# Patient Record
Sex: Female | Born: 1945
Health system: Southern US, Community
[De-identification: ages and names within clinical notes are randomized; demographics above are authoritative.]

## PROBLEM LIST (undated history)

## (undated) HISTORY — PX: CHOLECYSTECTOMY: SHX55

---

## 2001-06-12 ENCOUNTER — Other Ambulatory Visit: Admission: RE | Admit: 2001-06-12 | Discharge: 2001-06-12 | Payer: Self-pay | Admitting: Family Medicine

## 2001-06-17 ENCOUNTER — Encounter: Payer: Self-pay | Admitting: Family Medicine

## 2001-06-17 ENCOUNTER — Ambulatory Visit (HOSPITAL_COMMUNITY): Admission: RE | Admit: 2001-06-17 | Discharge: 2001-06-17 | Payer: Self-pay | Admitting: Family Medicine

## 2002-02-12 ENCOUNTER — Encounter (HOSPITAL_COMMUNITY): Admission: RE | Admit: 2002-02-12 | Discharge: 2002-03-14 | Payer: Self-pay | Admitting: Rheumatology

## 2002-12-07 ENCOUNTER — Emergency Department (HOSPITAL_COMMUNITY): Admission: EM | Admit: 2002-12-07 | Discharge: 2002-12-07 | Payer: Self-pay | Admitting: Emergency Medicine

## 2002-12-07 ENCOUNTER — Encounter: Payer: Self-pay | Admitting: Emergency Medicine

## 2005-03-23 ENCOUNTER — Ambulatory Visit (HOSPITAL_COMMUNITY): Admission: RE | Admit: 2005-03-23 | Discharge: 2005-03-23 | Payer: Self-pay | Admitting: Family Medicine

## 2007-11-06 DIAGNOSIS — G35 Multiple sclerosis: Secondary | ICD-10-CM

## 2007-11-06 HISTORY — DX: Multiple sclerosis: G35

## 2011-07-19 ENCOUNTER — Encounter: Payer: Self-pay | Admitting: Emergency Medicine

## 2011-07-19 ENCOUNTER — Emergency Department (HOSPITAL_COMMUNITY)
Admission: EM | Admit: 2011-07-19 | Discharge: 2011-07-19 | Disposition: A | Discharge status: Home / Self Care | Payer: Medicaid Other | Attending: Emergency Medicine | Admitting: Emergency Medicine

## 2011-07-19 ENCOUNTER — Other Ambulatory Visit: Payer: Self-pay

## 2011-07-19 ENCOUNTER — Emergency Department (HOSPITAL_COMMUNITY): Payer: Medicaid Other

## 2011-07-19 DIAGNOSIS — I498 Other specified cardiac arrhythmias: Secondary | ICD-10-CM | POA: Insufficient documentation

## 2011-07-19 DIAGNOSIS — N390 Urinary tract infection, site not specified: Secondary | ICD-10-CM | POA: Insufficient documentation

## 2011-07-19 LAB — URINALYSIS, ROUTINE W REFLEX MICROSCOPIC
Bilirubin Urine: NEGATIVE
Ketones, ur: 40 mg/dL — AB
Nitrite: NEGATIVE
Specific Gravity, Urine: 1.025 (ref 1.005–1.030)
pH: 7 (ref 5.0–8.0)

## 2011-07-19 LAB — CARDIAC PANEL(CRET KIN+CKTOT+MB+TROPI)
Relative Index: INVALID (ref 0.0–2.5)
Troponin I: <0.3 ng/mL (ref <0.30)

## 2011-07-19 LAB — COMPREHENSIVE METABOLIC PANEL
ALT: 13 U/L (ref 0–35)
AST: 18 U/L (ref 0–37)
Alkaline Phosphatase: 149 U/L — ABNORMAL HIGH (ref 39–117)
CO2: 28 mEq/L (ref 19–32)
Calcium: 10.5 mg/dL (ref 8.4–10.5)
GFR calc Af Amer: >60 mL/min (ref >60)
Glucose, Bld: 126 mg/dL — ABNORMAL HIGH (ref 70–99)
Potassium: 3.3 mEq/L — ABNORMAL LOW (ref 3.5–5.1)
Sodium: 139 mEq/L (ref 135–145)
Total Protein: 8.7 g/dL — ABNORMAL HIGH (ref 6.0–8.3)

## 2011-07-19 LAB — DIFFERENTIAL
Basophils Absolute: 0 10*3/uL (ref 0.0–0.1)
Eosinophils Absolute: 0 10*3/uL (ref 0.0–0.7)
Lymphocytes Relative: 9 % — ABNORMAL LOW (ref 12–46)
Lymphs Abs: 1.2 10*3/uL (ref 0.7–4.0)
Neutrophils Relative %: 87 % — ABNORMAL HIGH (ref 43–77)

## 2011-07-19 LAB — OCCULT BLOOD, POC DEVICE: Fecal Occult Bld: NEGATIVE

## 2011-07-19 LAB — URINE MICROSCOPIC-ADD ON

## 2011-07-19 LAB — PROTIME-INR: INR: 1.07 (ref 0.00–1.49)

## 2011-07-19 LAB — CBC
Platelets: 296 10*3/uL (ref 150–400)
RBC: 4.77 MIL/uL (ref 3.87–5.11)
RDW: 15.2 % (ref 11.5–15.5)
WBC: 13.8 10*3/uL — ABNORMAL HIGH (ref 4.0–10.5)

## 2011-07-19 LAB — APTT: aPTT: 28 seconds (ref 24–37)

## 2011-07-19 MED ORDER — NITROFURANTOIN MACROCRYSTAL 100 MG PO CAPS: 100.0000 mg | ORAL_CAPSULE | Freq: Four times a day (QID) | ORAL | Status: AC

## 2011-07-19 MED ORDER — CEFTRIAXONE SODIUM 1 G IJ SOLR
1.0000 g | Freq: Once | INTRAMUSCULAR | Status: AC
  Administered 2011-07-19: 1 g via INTRAVENOUS
  Filled 2011-07-19: qty 1

## 2011-07-19 MED ORDER — IOHEXOL 300 MG/ML  SOLN
100.0000 mL | Freq: Once | INTRAMUSCULAR | Status: AC | PRN
  Administered 2011-07-19: 100 mL via INTRAVENOUS

## 2011-07-19 MED ORDER — SODIUM CHLORIDE 0.9 % IV BOLUS (SEPSIS)
500.0000 mL | Freq: Once | INTRAVENOUS | Status: AC
  Administered 2011-07-19: 13:00:00 via INTRAVENOUS

## 2011-07-19 MED ORDER — SODIUM CHLORIDE 0.9 % IV SOLN
INTRAVENOUS | Status: DC
  Administered 2011-07-19: 15:00:00 via INTRAVENOUS

## 2011-07-19 NOTE — ED Notes (Signed)
Pt has gallbladder out on aug 9 at [facility], continues to have pain that has gotten worse today, n/v today as well, pt has contractures of lower legs, uses wheelchair, abd tender with palpation,

## 2011-07-19 NOTE — ED Notes (Signed)
Family member also states that pt doesn't want to eat after the surgery, states that most of the pt's favorite foods are foods that she has been advised to avoid now, prior to surgery pt had good appetite.

## 2011-07-19 NOTE — ED Notes (Signed)
Pt was heme negative

## 2011-07-19 NOTE — ED Notes (Signed)
Pt sleeping, will arouse,

## 2011-07-19 NOTE — ED Notes (Signed)
Pt slow to drink contrast, comfort measures provided

## 2011-07-19 NOTE — ED Notes (Signed)
Pt c/o abd pain with n/v and no appetite since having her gallbladder removed aug. 9th.

## 2011-07-19 NOTE — ED Notes (Signed)
Pt sleeping, will arouse with verbal stimuli, pt attempted to drink contrast, pt will take sips at a time,

## 2011-07-19 NOTE — ED Notes (Signed)
Iv attempted X2 without success, another RN to attempt.  

## 2011-07-19 NOTE — ED Provider Notes (Signed)
History   Scribed for Hilario Quarry, MD, the patient was seen in room APA19/APA19. This chart was scribed by Clarita Crane. This patients care was started at 11:16AM  CSN: [id number] Arrival date & time: 07/19/2011 10:56 AM   Chief Complaint  Patient presents with   Nausea   Emesis   Weight Loss   The history is provided by a relative.   Tracy Armstrong is a 65 y.o. female who presents to the Emergency Department accompanied by daughter who states patient is complaining of severe weight loss with associated nausea, vomiting and decreased appetite onset several 1 month ago following cholecystectomy performed on Jun 14, 2011 at [facility]. Denies fever, diarrhea. Patient's daughter reports patient has lost approximately 50-60lbs in the last month following the cholecystectomy performed.  Notes patient's physician/surgeon recommended a cholecystectomy be performed after being with decreased appetite and having intermittent episodes of hematemesis during July 2012 States patient had a follow up with with surgeon on 07/05/2011 to have evaluation for persistent n/v following cholecystectomy with no further plan or treatment discussed. Patient with h/o stroke with left hemiparesis, hypertension.  HPI ELEMENTS: Onset: 1 month ago Duration: persistent since onset  Timing: constant   Severity: severe   Context:  as above  Associated symptoms: +n/v, decreased appetite. Denies fever, diarrhea   PAST MEDICAL HISTORY:  History reviewed. No pertinent past medical history.  PAST SURGICAL HISTORY:  Past Surgical History  Procedure Date   Cholecystectomy     MEDICATIONS:  Previous Medications   HYDROCODONE-ACETAMINOPHEN (NORCO) 5-325 MG PER TABLET    Take 1 tablet by mouth every 6 (six) hours as needed. For pain    HYDROCODONE-ACETAMINOPHEN (VICODIN) 5-500 MG PER TABLET    Take 1 tablet by mouth every 6 (six) hours as needed. For pain      ALLERGIES:  Allergies as of  07/19/2011   (No Known Allergies)     FAMILY HISTORY:  History reviewed. No pertinent family history.   SOCIAL HISTORY: History   Social History   Marital Status: Married    Spouse Name: N/A    Number of Children: N/A   Years of Education: N/A   Social History Main Topics   Smoking status: None   Smokeless tobacco: None   Alcohol Use: No   Drug Use: No   Sexually Active:    Other Topics Concern   None   Social History Narrative   None    OB History    Grav Para Term Preterm Abortions TAB SAB Ect Mult Living                  Review of Systems 10 Systems reviewed and are negative for acute change except as noted in the HPI.  Allergies  Review of patient's allergies indicates no known allergies.  Home Medications   Current Outpatient Rx  Name Route Sig Dispense Refill   HYDROCODONE-ACETAMINOPHEN 5-325 MG PO TABS Oral Take 1 tablet by mouth every 6 (six) hours as needed. For pain      HYDROCODONE-ACETAMINOPHEN 5-500 MG PO TABS Oral Take 1 tablet by mouth every 6 (six) hours as needed. For pain       Physical Exam    BP 116/49   Pulse 86   Temp(Src) 97.4 F (36.3 C) (Oral)   Resp 20   Ht 5\' 6"  (1.676 m)   Wt 132 lb (59.875 kg)   BMI 21.31 kg/m2   SpO2 100%  Physical  Exam  Nursing note and vitals reviewed. Constitutional: She appears well-developed and well-nourished. No distress.  HENT:  Head: Normocephalic and atraumatic.  Eyes: EOM are normal. Pupils are equal, round, and reactive to light.  Neck: Neck supple.  Cardiovascular: Normal rate, regular rhythm and intact distal pulses.   No murmur heard. Pulmonary/Chest: Effort normal. She has no wheezes.  Abdominal: Soft. She exhibits no distension. There is no tenderness.  Musculoskeletal: Normal range of motion. She exhibits no edema.  Neurological: She is alert. No sensory deficit.  Skin: Skin is warm and dry.  Psychiatric: She has a normal mood and affect. Her behavior is normal. She is  noncommunicative.    ED Course  Procedures  OTHER DATA REVIEWED: Nursing notes, vital signs, and past medical records reviewed. Lab results reviewed and considered Imaging results reviewed and considered  DIAGNOSTIC STUDIES: Oxygen Saturation is 98% on room air, normal by my interpretation.    LABS / RADIOLOGY: Results for orders placed during the hospital encounter of 07/19/11  CBC      Component Value Range   WBC 13.8 (*) 4.0 - 10.5 (K/uL)   RBC 4.77  3.87 - 5.11 (MIL/uL)   Hemoglobin 13.1  12.0 - 15.0 (g/dL)   HCT 16.1  09.6 - 04.5 (%)   MCV 84.5  78.0 - 100.0 (fL)   MCH 27.5  26.0 - 34.0 (pg)   MCHC 32.5  30.0 - 36.0 (g/dL)   RDW 40.9  81.1 - 91.4 (%)   Platelets 296  150 - 400 (K/uL)  DIFFERENTIAL      Component Value Range   Neutrophils Relative 87 (*) 43 - 77 (%)   Neutro Abs 12.0 (*) 1.7 - 7.7 (K/uL)   Lymphocytes Relative 9 (*) 12 - 46 (%)   Lymphs Abs 1.2  0.7 - 4.0 (K/uL)   Monocytes Relative 4  3 - 12 (%)   Monocytes Absolute 0.6  0.1 - 1.0 (K/uL)   Eosinophils Relative 0  0 - 5 (%)   Eosinophils Absolute 0.0  0.0 - 0.7 (K/uL)   Basophils Relative 0  0 - 1 (%)   Basophils Absolute 0.0  0.0 - 0.1 (K/uL)  COMPREHENSIVE METABOLIC PANEL      Component Value Range   Sodium 139  135 - 145 (mEq/L)   Potassium 3.3 (*) 3.5 - 5.1 (mEq/L)   Chloride 96  96 - 112 (mEq/L)   CO2 28  19 - 32 (mEq/L)   Glucose, Bld 126 (*) 70 - 99 (mg/dL)   BUN 17  6 - 23 (mg/dL)   Creatinine, Ser 7.82  0.50 - 1.10 (mg/dL)   Calcium 95.6  8.4 - 10.5 (mg/dL)   Total Protein 8.7 (*) 6.0 - 8.3 (g/dL)   Albumin 3.8  3.5 - 5.2 (g/dL)   AST 18  0 - 37 (U/L)   ALT 13  0 - 35 (U/L)   Alkaline Phosphatase 149 (*) 39 - 117 (U/L)   Total Bilirubin 0.5  0.3 - 1.2 (mg/dL)   GFR calc non Af Amer >60  >60 (mL/min)   GFR calc Af Amer >60  >60 (mL/min)  URINALYSIS, ROUTINE W REFLEX MICROSCOPIC      Component Value Range   Color, Urine YELLOW  YELLOW    Appearance TURBID (*) CLEAR    Specific  Gravity, Urine 1.025  1.005 - 1.030    pH 7.0  5.0 - 8.0    Glucose, UA NEGATIVE  NEGATIVE (mg/dL)   Hgb  urine dipstick MODERATE (*) NEGATIVE    Bilirubin Urine NEGATIVE  NEGATIVE    Ketones, ur 40 (*) NEGATIVE (mg/dL)   Protein, ur NEGATIVE  NEGATIVE (mg/dL)   Urobilinogen, UA 0.2  0.0 - 1.0 (mg/dL)   Nitrite NEGATIVE  NEGATIVE    Leukocytes, UA MODERATE (*) NEGATIVE   PROTIME-INR      Component Value Range   Prothrombin Time 14.1  11.6 - 15.2 (seconds)   INR 1.07  0.00 - 1.49   APTT      Component Value Range   aPTT 28  24 - 37 (seconds)  CARDIAC PANEL(CRET KIN+CKTOT+MB+TROPI)      Component Value Range   Total CK 25  7 - 177 (U/L)   CK, MB 3.2  0.3 - 4.0 (ng/mL)   Troponin I <0.30  <0.30 (ng/mL)   Relative Index RELATIVE INDEX IS INVALID  0.0 - 2.5   OCCULT BLOOD, POC DEVICE      Component Value Range   Fecal Occult Bld NEGATIVE    URINE MICROSCOPIC-ADD ON      Component Value Range   WBC, UA TOO NUMEROUS TO COUNT  <3 (WBC/hpf)   RBC / HPF 0-2  <3 (RBC/hpf)   Ct Abdomen Pelvis W Contrast  07/19/2011  *RADIOLOGY REPORT*  Clinical Data: History of cholecystectomy and August 2012, weight loss, vomiting  CT ABDOMEN AND PELVIS WITH CONTRAST  Technique:  Multidetector CT imaging of the abdomen and pelvis was performed following the standard protocol during bolus administration of intravenous contrast.  Contrast: OMNIPAQUE IOHEXOL 300 MG/ML IV SOLN  Comparison: None.  Findings: The lung bases are clear.  The liver enhances with no focal abnormality and no ductal dilatation is noted.  Surgical clips are present from prior cholecystectomy.  No abscess or fluid collection is seen within the gallbladder.  The pancreas appears somewhat atrophic and the pancreatic duct is not dilated.  The adrenal glands and spleen are unremarkable.  The stomach is not well distended.  The kidneys enhance with no calculus or mass and no hydronephrosis is seen.  The abdominal aorta is normal in caliber.   No adenopathy is seen.  The urinary bladder cannot be assessed, being decompressed by Foley catheter with some air within urinary bladder.  The uterus is normal in size and slightly inhomogeneous.  Small fibroids cannot be excluded.  No free fluid is seen within the pelvis.  There is a moderate amount of feces throughout the colon.  The only questionable abnormality of the colon is within the cecum where there is slight prominence of the mucosa.  Mild edema of the mucosa of the cecum cannot be excluded.  The terminal ileum and appendix are unremarkable.  IMPRESSION:  1.  Mild mucosal edema of the cecum cannot be excluded.  The terminal ileum and appendix are unremarkable. 2.  The urinary bladder is decompressed by Foley catheter.  Original Report Authenticated By: Juline Patch, M.D.    PROCEDURES:  ED COURSE / COORDINATION OF CARE: Orders Placed This Encounter  Procedures   Urine culture   CT Abdomen Pelvis W Contrast   CBC   Differential   Comprehensive metabolic panel   Urinalysis, Routine w reflex microscopic   Occult blood x 1 card to lab, stool   Protime-INR   APTT   Cardiac panel(cret kin+cktot+mb+tropi)   Urine microscopic-add on   Insert foley catheter   Occult blood, poc device   ED EKG    Date: 07/19/2011  Rate:  109  Rhythm: sinus tachycardia  QRS Axis: normal  Intervals: normal  ST/T Wave abnormalities: diffuse t wave inversion  Conduction Disutrbances:none  Narrative Interpretation:   Old EKG Reviewed: none available  MDM: Differential Diagnosis: Patient's lab significant for urinary tract infection. Patient with CT scan  PLAN: Discharge Home The patient is to return the emergency department if there is any worsening of symptoms. I have reviewed the discharge instructions with the patient/family  CONDITION ON DISCHARGE: Good.    MEDICATIONS GIVEN IN THE E.D.  Medications  0.9 %  sodium chloride infusion (0  Intravenous Duplicate 07/19/11 1435)    HYDROcodone-acetaminophen (NORCO) 5-325 MG per tablet (not administered)  HYDROcodone-acetaminophen (VICODIN) 5-500 MG per tablet (not administered)  sodium chloride 0.9 % bolus 500 mL ( mL Intravenous Given 07/19/11 1301)  cefTRIAXone (ROCEPHIN) 1 g in dextrose 5 % 50 mL IVPB (1 g Intravenous Given 07/19/11 1354)  iohexol (OMNIPAQUE) 300 MG/ML injection 100 mL (100 mL Intravenous Contrast Given 07/19/11 1509)     I personally performed the services described in this documentation, which was scribed in my presence. The recorded information has been reviewed and considered. Hilario Quarry, MD

## 2011-07-19 NOTE — ED Notes (Signed)
Pt daughter has left, message left for daughter to return call to er ,

## 2011-07-21 LAB — URINE CULTURE
Colony Count: >=100000
Culture  Setup Time: 201209140611

## 2011-07-22 NOTE — ED Notes (Signed)
+   urine culture. Chart sent to EDP office for review 

## 2011-07-26 NOTE — ED Notes (Signed)
Prescription called in to [facility] at [phone] for Cipro 500 mg po x3 days; no refills per josh geiple, pa-c

## 2012-12-30 DIAGNOSIS — I872 Venous insufficiency (chronic) (peripheral): Secondary | ICD-10-CM | POA: Diagnosis not present

## 2013-05-03 DIAGNOSIS — G35 Multiple sclerosis: Secondary | ICD-10-CM | POA: Diagnosis not present

## 2013-05-03 DIAGNOSIS — R32 Unspecified urinary incontinence: Secondary | ICD-10-CM | POA: Diagnosis not present

## 2013-05-03 DIAGNOSIS — G8929 Other chronic pain: Secondary | ICD-10-CM | POA: Diagnosis not present

## 2013-05-26 DIAGNOSIS — I10 Essential (primary) hypertension: Secondary | ICD-10-CM | POA: Diagnosis not present

## 2013-05-26 DIAGNOSIS — F0390 Unspecified dementia without behavioral disturbance: Secondary | ICD-10-CM | POA: Diagnosis not present

## 2013-05-26 DIAGNOSIS — Z0000 Encounter for general adult medical examination without abnormal findings: Secondary | ICD-10-CM | POA: Diagnosis not present

## 2013-07-02 DIAGNOSIS — G8929 Other chronic pain: Secondary | ICD-10-CM | POA: Diagnosis not present

## 2013-07-02 DIAGNOSIS — R32 Unspecified urinary incontinence: Secondary | ICD-10-CM | POA: Diagnosis not present

## 2013-07-02 DIAGNOSIS — G35 Multiple sclerosis: Secondary | ICD-10-CM | POA: Diagnosis not present

## 2013-08-31 DIAGNOSIS — G8929 Other chronic pain: Secondary | ICD-10-CM | POA: Diagnosis not present

## 2013-08-31 DIAGNOSIS — G35 Multiple sclerosis: Secondary | ICD-10-CM | POA: Diagnosis not present

## 2013-08-31 DIAGNOSIS — R32 Unspecified urinary incontinence: Secondary | ICD-10-CM | POA: Diagnosis not present

## 2013-09-10 DIAGNOSIS — E2839 Other primary ovarian failure: Secondary | ICD-10-CM | POA: Diagnosis not present

## 2013-09-10 DIAGNOSIS — M810 Age-related osteoporosis without current pathological fracture: Secondary | ICD-10-CM | POA: Diagnosis not present

## 2013-09-10 DIAGNOSIS — G35 Multiple sclerosis: Secondary | ICD-10-CM | POA: Diagnosis not present

## 2013-09-10 DIAGNOSIS — E289 Ovarian dysfunction, unspecified: Secondary | ICD-10-CM | POA: Diagnosis not present

## 2013-10-09 DIAGNOSIS — I10 Essential (primary) hypertension: Secondary | ICD-10-CM | POA: Diagnosis not present

## 2013-10-30 DIAGNOSIS — G8929 Other chronic pain: Secondary | ICD-10-CM | POA: Diagnosis not present

## 2013-10-30 DIAGNOSIS — R32 Unspecified urinary incontinence: Secondary | ICD-10-CM | POA: Diagnosis not present

## 2013-10-30 DIAGNOSIS — G35 Multiple sclerosis: Secondary | ICD-10-CM | POA: Diagnosis not present

## 2013-12-14 DIAGNOSIS — I10 Essential (primary) hypertension: Secondary | ICD-10-CM | POA: Diagnosis not present

## 2013-12-29 DIAGNOSIS — G35 Multiple sclerosis: Secondary | ICD-10-CM | POA: Diagnosis not present

## 2013-12-29 DIAGNOSIS — R32 Unspecified urinary incontinence: Secondary | ICD-10-CM | POA: Diagnosis not present

## 2013-12-29 DIAGNOSIS — G8929 Other chronic pain: Secondary | ICD-10-CM | POA: Diagnosis not present

## 2013-12-29 DIAGNOSIS — M2459 Contracture, other specified joint: Secondary | ICD-10-CM | POA: Diagnosis not present

## 2014-01-12 DIAGNOSIS — I10 Essential (primary) hypertension: Secondary | ICD-10-CM | POA: Diagnosis not present

## 2014-01-12 DIAGNOSIS — Z131 Encounter for screening for diabetes mellitus: Secondary | ICD-10-CM | POA: Diagnosis not present

## 2014-02-04 DIAGNOSIS — G35 Multiple sclerosis: Secondary | ICD-10-CM | POA: Diagnosis not present

## 2014-02-22 ENCOUNTER — Ambulatory Visit (HOSPITAL_COMMUNITY)
Admission: RE | Admit: 2014-02-22 | Discharge: 2014-02-22 | Disposition: A | Discharge status: Home / Self Care | Payer: Medicare Other | Source: Ambulatory Visit | Attending: Internal Medicine | Admitting: Internal Medicine

## 2014-02-22 DIAGNOSIS — G35 Multiple sclerosis: Secondary | ICD-10-CM | POA: Diagnosis not present

## 2014-02-22 DIAGNOSIS — IMO0001 Reserved for inherently not codable concepts without codable children: Secondary | ICD-10-CM | POA: Diagnosis not present

## 2014-02-22 DIAGNOSIS — M25619 Stiffness of unspecified shoulder, not elsewhere classified: Secondary | ICD-10-CM | POA: Diagnosis not present

## 2014-02-22 DIAGNOSIS — M24569 Contracture, unspecified knee: Secondary | ICD-10-CM | POA: Insufficient documentation

## 2014-02-22 NOTE — Evaluation (Addendum)
Occupational Therapy Wheelchair Evaluation  Patient Details  Name: Tracy Armstrong MRN: [id number] Date of Birth: July 22, 1946  Today's Date: 02/22/2014 Time: [id number] OT Time Calculation (min): 29 min OT w/c eval [id number] [name]29'   Past Medical History: No past medical history on file. Past Surgical History:  Past Surgical History  Procedure Laterality Date  . Cholecystectomy      Date 02/22/2014 Patient Name Tracy Armstrong [address] [place], [place] [id number] DOB July 22, 1946   To Whom It May Concern,    Tracy Armstrong was seen today in this clinic for a power wheelchair evaluation due to limited mobility sustained from limited progressive MS diagnosed approx. 3-4 years ago.  Tracy Armstrong is a 68 y/o female with a past medical history that includes multiple sclerosis, arthritis, poor circulation, decubitus ulcers, and cellulitis.  Tracy Armstrong lives with 927 y/o daughter, [name] who provides care 24/7. Patient is able to feed herself independently and requires Total A-Max A for BADL such as bathing and dressing. Patient spends the majority of her time in her manual wheelchair, hospital bed or recliner. Patient relies on others for all mobility. Patient has not walked for 12-13 years.  A FULL PHYSICAL ASSESSMENT REVEALS THE FOLLOWING    Existing Equipment:   Tracy Armstrong has a manual wheelchair, hospital bed, and hoyer lift. Patient has had a power wheelchair in the past and has since been nonfunctioning.   Transfers:  Patient's daughter, [name] utilizes hoyer lift for transfers or physically picks patient up to transfer to various surfaces.      Head and Neck:   ROM slightly limited: extension, Right and Left rotation  Trunk:     Zero trunk control. Unable to sit without assistance.   Pelvis:    no active or passive movement.     Hip: No active movement. Patient's resting seated posture is with hip in slight extension due to contracture.  Knees:  No active or passive movement. Bilateral knees in  flexion contracture.   Feet and Ankles: Right ankle: active dorsiflexion and plantar flexion. No active or passive movement in Left ankle.   Upper Extremities: AROM bilateral shoulder flexion to approx. 75 degrees. Shoulder abduction to approx. 65 degrees. Bilateral elbow and wrist AROM is WFL.   Lower Extremities:  Please see above. No active or passive movement in hips. Left leg adducted due to contracture.   Weight Shifting Ability:  Patient is unable to weight shift without assistance.   Skin Integrity:  Patient has had one decubitus ulcer on left buttocks.  Cognition:  Patient is oriented x3.  Activity Tolerance: Patient is able to tolerate siting up for 2 hours at a time during the day.  GOALS/OBJECTIVE OF SEATING INTERVENTION:  Recommendations: A power wheelchair is recommended for Tracy Armstrong due to her limited progressive MS. A power wheelchair would allow Ms. Lower greater independence and safety when completing daily activities in her home and in the community. Tracy Armstrong's quality of life would greatly improve with the use of a power wheelchair.   Medical necessity of recommended durable medical equipment: Based upon this evaluation, it has been determined that the patient has functional deficits in the areas of mobility and self care, which are a result of the above listed diagnosis.   Specific functional limitations include: The patient is unable to walk and also unable to functionally propel a lightweight or standard manual wheelchair for independent mobility within the home due to upper and lower extremity weakness, trunk instability and  poor endurance. Patient will use the power wheelchair on a full time basis as her primary means of mobility. Patient s mobility, positioning, and durability needs make the use of a standard power wheelchair inappropriate. The recommended wheelchair meets current and future positioning needs, accessibility, durability, and safety requirements for  functional use within patient s home environment. It is the least expensive power wheelchair that meets the patients current and future needs.  Power Chair: (Q-6 Edge)  Patient has physical and cognitive capabilities to use the recommended power mobility device. Patient s home provides adequate access between rooms, maneuvering space, and surfaces for the operation of a power chair.  The Power base is required in order to provide the patient with the medically necessary electronics and multiple power operations (i.e. tilt,and  recline, ) through the recommended hand control.  This chair is more maneuverable than comparably priced traditional power wheelchairs due to the drive wheels being directly at the center of gravity of the user. This maneuverability is required to enable the patient to negotiate the tight turns, small spaces, and obstacles characteristic of the home environment.   Electronics: Copy    Multiple actuator through the drive control. These electronics are sufficiently programmable to allow patient to operate the power chair safely within the home environment. The patient requires these controls to operate the tilt, recline,  and power elevating leg rest through the joystick thus eliminating additional switches and the need to find additional access points, as she is proficient with controlling the power chair through the joystick. These controls will also allow for any further modifications/addition to the drive controls as patients function deteriorate in the future  Tilt System : (Power Tilt in Space) Patient must rest in the recumbent position several times per day and transfers between the wheelchair and bed are very difficult. The tilt mechanism decreases the need for extensive assistance required for multiple transfers and is medically necessary to position the patient properly for care required.  Patient is  unable to tolerate sitting in the upright position secondary to postural weakness and fatigue. The patient is also unable to independently perform forward or side lean weight shifts for pressure management and remains in a posterior pelvic tilt for extended periods. The recommended tilt system provides patient with a method of performing an adequate weight shift independently, thereby, reducing the risk of skin breakdown. With the power tilt, the patient will be able to independently adjust their position when sitting in the wheelchair to allow for adequate pressure relief and support posture.  Power Recline:  A combination system of Tilt and Recline are needed in order to achieve maximum weight re-distribution and comfort as sensation is intact   Non Power Elevating Leg rest: (Center mount power elevating leg rests) The use of elevating leg rests enables the patient to independently elevate her legs when sitting in the wheelchair, effectively reducing or accommodating spasticity and extensor tone of lower extremities. The leg rests must be articulating to effectively achieve elevation without resulting in flexion of the knees and postural compromise.  Foot Rests:  Patient requires (angle adjustable footplates) to accommodate her foot position, and to provide adequate foot support and pressure distribution.  [employer] Acta Back Deep: The recommended (Curved/Reclining/built in lateral support Back) simulates the contours of the patient s trunk and provides additional lumbar support to help maintain proper spinal curvatures and pelvic alignment. Adjustability of the contours is necessary to meet the patients  positioning, safety and comfort needs.        Seat Cushion: The recommended (Solution 1) (18 x 20) cushion allows for overall protection and equalization for positioning by accommodating to individual contours. The (Solution 1) cushion is medically necessary to offer the patient proper  positioning,  and a stable sitting surface while seated in the wheelchair.  The contours of the base enhance posture by accommodation of and additional protection to bony prominences. The  cushion also allows for a breathable surface which transfers heat and moisture rather than trapping it.    Armrest:  Height adjustable Full length armrests are necessary to support the shoulder joint and surrounding structures. Lack of arm support can result in chronic stretch and damage of the joint capsule, ligaments, nerves, and blood vessels. The patient requires the use of height adjustable armrests to achieve proper armrests height for her shoulders to be adequately supported.  The following durable medical equipment is recommended to treat the patient s permanent and progressive disability.  Q6-Edge  power wheelchair (18 x 20) Head Rest True comfort Headrest (Large Headrest w/mounting hardware/removable) Transport Tie down Brackets  Q logics Joystick (Swing away Right mount) 100 Amp Q-Logic controller and Harness for expandable controller (multiple power options) [employer] Acta Back Deep  positioning Backback  Soulution 1 skin protection & positioning Seat cushion Power Tilt Power Recline  Non-Power Elevating Leg Rests (center mount) Calf Panel Angle adjustable Foot Plates Full Length height/angle adjustable swing away arm rests(Cantilever) Waterfall Arm pads  If you require any further information concerning Ms. Lalor's positioning, independence or mobility needs; or any further information why a lesser device will not work, please do not hesitate to contact me at [facility], [address] [place] [address] [phone].    Thank you for this referral,  ____________________        Limmie Patricia OTR/L, CBIS        GO Functional Assessment Tool Used: clinical observation/judgement Functional Limitation: Mobility: Walking and moving around Mobility: Walking and Moving  Around Current Status [phone]): 100 percent impaired, limited or restricted Mobility: Walking and Moving Around Goal Status [phone]): 100 percent impaired, limited or restricted Mobility: Walking and Moving Around Discharge Status [phone]): 100 percent impaired, limited or restricted  Limmie Patricia, OTR/L,CBIS   02/22/2014, 4:07 PM  Physician Documentation Your signature is required to indicate approval of the treatment plan as stated above.  Please sign and either send electronically or make a copy of this report for your files and return this physician signed original.  Please mark one 1.__approve of plan  2. ___approve of plan with the following conditions.   ______________________________                                                          _____________________ Physician Signature  Date  

## 2014-02-27 DIAGNOSIS — G35 Multiple sclerosis: Secondary | ICD-10-CM | POA: Diagnosis not present

## 2014-02-27 DIAGNOSIS — R32 Unspecified urinary incontinence: Secondary | ICD-10-CM | POA: Diagnosis not present

## 2014-02-27 DIAGNOSIS — G8929 Other chronic pain: Secondary | ICD-10-CM | POA: Diagnosis not present

## 2014-02-27 DIAGNOSIS — M2459 Contracture, other specified joint: Secondary | ICD-10-CM | POA: Diagnosis not present

## 2014-03-25 DIAGNOSIS — G35 Multiple sclerosis: Secondary | ICD-10-CM | POA: Diagnosis not present

## 2014-04-28 DIAGNOSIS — G8929 Other chronic pain: Secondary | ICD-10-CM | POA: Diagnosis not present

## 2014-04-28 DIAGNOSIS — R32 Unspecified urinary incontinence: Secondary | ICD-10-CM | POA: Diagnosis not present

## 2014-04-28 DIAGNOSIS — G35 Multiple sclerosis: Secondary | ICD-10-CM | POA: Diagnosis not present

## 2014-04-28 DIAGNOSIS — M2459 Contracture, other specified joint: Secondary | ICD-10-CM | POA: Diagnosis not present

## 2014-05-11 NOTE — Addendum Note (Signed)
Encounter addended by: Jeanene Erb, OT on: 05/11/2014  3:53 PM<BR>     Documentation filed: Episodes

## 2014-05-14 DIAGNOSIS — L8993 Pressure ulcer of unspecified site, stage 3: Secondary | ICD-10-CM | POA: Diagnosis not present

## 2014-05-19 DIAGNOSIS — L8992 Pressure ulcer of unspecified site, stage 2: Secondary | ICD-10-CM | POA: Diagnosis not present

## 2014-05-19 DIAGNOSIS — L89899 Pressure ulcer of other site, unspecified stage: Secondary | ICD-10-CM | POA: Diagnosis not present

## 2014-05-19 DIAGNOSIS — R32 Unspecified urinary incontinence: Secondary | ICD-10-CM | POA: Diagnosis not present

## 2014-05-19 DIAGNOSIS — G8929 Other chronic pain: Secondary | ICD-10-CM | POA: Diagnosis not present

## 2014-05-19 DIAGNOSIS — L8993 Pressure ulcer of unspecified site, stage 3: Secondary | ICD-10-CM | POA: Diagnosis not present

## 2014-05-19 DIAGNOSIS — I10 Essential (primary) hypertension: Secondary | ICD-10-CM | POA: Diagnosis not present

## 2014-05-19 DIAGNOSIS — M2459 Contracture, other specified joint: Secondary | ICD-10-CM | POA: Diagnosis not present

## 2014-05-19 DIAGNOSIS — G35 Multiple sclerosis: Secondary | ICD-10-CM | POA: Diagnosis not present

## 2014-05-26 DIAGNOSIS — M2459 Contracture, other specified joint: Secondary | ICD-10-CM | POA: Diagnosis not present

## 2014-05-26 DIAGNOSIS — L8992 Pressure ulcer of unspecified site, stage 2: Secondary | ICD-10-CM | POA: Diagnosis not present

## 2014-05-26 DIAGNOSIS — L89899 Pressure ulcer of other site, unspecified stage: Secondary | ICD-10-CM | POA: Diagnosis not present

## 2014-05-26 DIAGNOSIS — G35 Multiple sclerosis: Secondary | ICD-10-CM | POA: Diagnosis not present

## 2014-05-26 DIAGNOSIS — L8993 Pressure ulcer of unspecified site, stage 3: Secondary | ICD-10-CM | POA: Diagnosis not present

## 2014-05-26 DIAGNOSIS — I10 Essential (primary) hypertension: Secondary | ICD-10-CM | POA: Diagnosis not present

## 2014-06-03 DIAGNOSIS — L8992 Pressure ulcer of unspecified site, stage 2: Secondary | ICD-10-CM | POA: Diagnosis not present

## 2014-06-03 DIAGNOSIS — L8993 Pressure ulcer of unspecified site, stage 3: Secondary | ICD-10-CM | POA: Diagnosis not present

## 2014-06-03 DIAGNOSIS — I10 Essential (primary) hypertension: Secondary | ICD-10-CM | POA: Diagnosis not present

## 2014-06-03 DIAGNOSIS — G35 Multiple sclerosis: Secondary | ICD-10-CM | POA: Diagnosis not present

## 2014-06-03 DIAGNOSIS — L89899 Pressure ulcer of other site, unspecified stage: Secondary | ICD-10-CM | POA: Diagnosis not present

## 2014-06-03 DIAGNOSIS — M2459 Contracture, other specified joint: Secondary | ICD-10-CM | POA: Diagnosis not present

## 2014-06-07 DIAGNOSIS — I10 Essential (primary) hypertension: Secondary | ICD-10-CM | POA: Diagnosis not present

## 2014-06-07 DIAGNOSIS — L8992 Pressure ulcer of unspecified site, stage 2: Secondary | ICD-10-CM | POA: Diagnosis not present

## 2014-06-07 DIAGNOSIS — M2459 Contracture, other specified joint: Secondary | ICD-10-CM | POA: Diagnosis not present

## 2014-06-07 DIAGNOSIS — L89899 Pressure ulcer of other site, unspecified stage: Secondary | ICD-10-CM | POA: Diagnosis not present

## 2014-06-07 DIAGNOSIS — G35 Multiple sclerosis: Secondary | ICD-10-CM | POA: Diagnosis not present

## 2014-06-07 DIAGNOSIS — L8993 Pressure ulcer of unspecified site, stage 3: Secondary | ICD-10-CM | POA: Diagnosis not present

## 2014-06-10 DIAGNOSIS — M2459 Contracture, other specified joint: Secondary | ICD-10-CM | POA: Diagnosis not present

## 2014-06-10 DIAGNOSIS — I10 Essential (primary) hypertension: Secondary | ICD-10-CM | POA: Diagnosis not present

## 2014-06-10 DIAGNOSIS — G35 Multiple sclerosis: Secondary | ICD-10-CM | POA: Diagnosis not present

## 2014-06-10 DIAGNOSIS — L89899 Pressure ulcer of other site, unspecified stage: Secondary | ICD-10-CM | POA: Diagnosis not present

## 2014-06-10 DIAGNOSIS — L8993 Pressure ulcer of unspecified site, stage 3: Secondary | ICD-10-CM | POA: Diagnosis not present

## 2014-06-10 DIAGNOSIS — L8992 Pressure ulcer of unspecified site, stage 2: Secondary | ICD-10-CM | POA: Diagnosis not present

## 2014-06-14 DIAGNOSIS — L8993 Pressure ulcer of unspecified site, stage 3: Secondary | ICD-10-CM | POA: Diagnosis not present

## 2014-06-14 DIAGNOSIS — M2459 Contracture, other specified joint: Secondary | ICD-10-CM | POA: Diagnosis not present

## 2014-06-14 DIAGNOSIS — L8992 Pressure ulcer of unspecified site, stage 2: Secondary | ICD-10-CM | POA: Diagnosis not present

## 2014-06-14 DIAGNOSIS — L89899 Pressure ulcer of other site, unspecified stage: Secondary | ICD-10-CM | POA: Diagnosis not present

## 2014-06-14 DIAGNOSIS — I10 Essential (primary) hypertension: Secondary | ICD-10-CM | POA: Diagnosis not present

## 2014-06-14 DIAGNOSIS — G35 Multiple sclerosis: Secondary | ICD-10-CM | POA: Diagnosis not present

## 2014-06-28 DIAGNOSIS — M2459 Contracture, other specified joint: Secondary | ICD-10-CM | POA: Diagnosis not present

## 2014-06-28 DIAGNOSIS — L89899 Pressure ulcer of other site, unspecified stage: Secondary | ICD-10-CM | POA: Diagnosis not present

## 2014-06-28 DIAGNOSIS — G35 Multiple sclerosis: Secondary | ICD-10-CM | POA: Diagnosis not present

## 2014-06-28 DIAGNOSIS — I10 Essential (primary) hypertension: Secondary | ICD-10-CM | POA: Diagnosis not present

## 2014-06-28 DIAGNOSIS — L8992 Pressure ulcer of unspecified site, stage 2: Secondary | ICD-10-CM | POA: Diagnosis not present

## 2014-06-28 DIAGNOSIS — L8993 Pressure ulcer of unspecified site, stage 3: Secondary | ICD-10-CM | POA: Diagnosis not present

## 2014-07-05 DIAGNOSIS — L89899 Pressure ulcer of other site, unspecified stage: Secondary | ICD-10-CM | POA: Diagnosis not present

## 2014-07-05 DIAGNOSIS — L8993 Pressure ulcer of unspecified site, stage 3: Secondary | ICD-10-CM | POA: Diagnosis not present

## 2014-07-05 DIAGNOSIS — G35 Multiple sclerosis: Secondary | ICD-10-CM | POA: Diagnosis not present

## 2014-07-05 DIAGNOSIS — I10 Essential (primary) hypertension: Secondary | ICD-10-CM | POA: Diagnosis not present

## 2014-07-05 DIAGNOSIS — L8992 Pressure ulcer of unspecified site, stage 2: Secondary | ICD-10-CM | POA: Diagnosis not present

## 2014-07-05 DIAGNOSIS — M2459 Contracture, other specified joint: Secondary | ICD-10-CM | POA: Diagnosis not present

## 2014-07-15 DIAGNOSIS — G35 Multiple sclerosis: Secondary | ICD-10-CM | POA: Diagnosis not present

## 2014-07-15 DIAGNOSIS — L8993 Pressure ulcer of unspecified site, stage 3: Secondary | ICD-10-CM | POA: Diagnosis not present

## 2014-07-15 DIAGNOSIS — I10 Essential (primary) hypertension: Secondary | ICD-10-CM | POA: Diagnosis not present

## 2014-07-15 DIAGNOSIS — L8992 Pressure ulcer of unspecified site, stage 2: Secondary | ICD-10-CM | POA: Diagnosis not present

## 2014-07-15 DIAGNOSIS — M2459 Contracture, other specified joint: Secondary | ICD-10-CM | POA: Diagnosis not present

## 2014-07-15 DIAGNOSIS — L89899 Pressure ulcer of other site, unspecified stage: Secondary | ICD-10-CM | POA: Diagnosis not present

## 2014-07-18 DIAGNOSIS — L89892 Pressure ulcer of other site, stage 2: Secondary | ICD-10-CM | POA: Diagnosis not present

## 2014-07-18 DIAGNOSIS — I10 Essential (primary) hypertension: Secondary | ICD-10-CM | POA: Diagnosis not present

## 2014-07-18 DIAGNOSIS — L89893 Pressure ulcer of other site, stage 3: Secondary | ICD-10-CM | POA: Diagnosis not present

## 2014-07-18 DIAGNOSIS — L8992 Pressure ulcer of unspecified site, stage 2: Secondary | ICD-10-CM | POA: Diagnosis not present

## 2014-07-18 DIAGNOSIS — G35 Multiple sclerosis: Secondary | ICD-10-CM | POA: Diagnosis not present

## 2014-07-18 DIAGNOSIS — M2450 Contracture, unspecified joint: Secondary | ICD-10-CM | POA: Diagnosis not present

## 2014-07-18 DIAGNOSIS — G8929 Other chronic pain: Secondary | ICD-10-CM | POA: Diagnosis not present

## 2014-07-18 DIAGNOSIS — L8993 Pressure ulcer of unspecified site, stage 3: Secondary | ICD-10-CM | POA: Diagnosis not present

## 2014-07-18 DIAGNOSIS — R32 Unspecified urinary incontinence: Secondary | ICD-10-CM | POA: Diagnosis not present

## 2014-07-18 DIAGNOSIS — L89899 Pressure ulcer of other site, unspecified stage: Secondary | ICD-10-CM | POA: Diagnosis not present

## 2014-07-20 DIAGNOSIS — L89892 Pressure ulcer of other site, stage 2: Secondary | ICD-10-CM | POA: Diagnosis not present

## 2014-07-20 DIAGNOSIS — L89893 Pressure ulcer of other site, stage 3: Secondary | ICD-10-CM | POA: Diagnosis not present

## 2014-07-20 DIAGNOSIS — I10 Essential (primary) hypertension: Secondary | ICD-10-CM | POA: Diagnosis not present

## 2014-07-20 DIAGNOSIS — G35 Multiple sclerosis: Secondary | ICD-10-CM | POA: Diagnosis not present

## 2014-07-20 DIAGNOSIS — M2450 Contracture, unspecified joint: Secondary | ICD-10-CM | POA: Diagnosis not present

## 2014-07-20 DIAGNOSIS — G8929 Other chronic pain: Secondary | ICD-10-CM | POA: Diagnosis not present

## 2014-07-21 DIAGNOSIS — L89893 Pressure ulcer of other site, stage 3: Secondary | ICD-10-CM | POA: Diagnosis not present

## 2014-07-21 DIAGNOSIS — G35 Multiple sclerosis: Secondary | ICD-10-CM | POA: Diagnosis not present

## 2014-07-21 DIAGNOSIS — M2450 Contracture, unspecified joint: Secondary | ICD-10-CM | POA: Diagnosis not present

## 2014-07-21 DIAGNOSIS — L89892 Pressure ulcer of other site, stage 2: Secondary | ICD-10-CM | POA: Diagnosis not present

## 2014-07-21 DIAGNOSIS — I10 Essential (primary) hypertension: Secondary | ICD-10-CM | POA: Diagnosis not present

## 2014-07-21 DIAGNOSIS — G8929 Other chronic pain: Secondary | ICD-10-CM | POA: Diagnosis not present

## 2014-07-27 DIAGNOSIS — I10 Essential (primary) hypertension: Secondary | ICD-10-CM | POA: Diagnosis not present

## 2014-07-27 DIAGNOSIS — G35 Multiple sclerosis: Secondary | ICD-10-CM | POA: Diagnosis not present

## 2014-07-27 DIAGNOSIS — G8929 Other chronic pain: Secondary | ICD-10-CM | POA: Diagnosis not present

## 2014-07-27 DIAGNOSIS — M2450 Contracture, unspecified joint: Secondary | ICD-10-CM | POA: Diagnosis not present

## 2014-07-27 DIAGNOSIS — L89893 Pressure ulcer of other site, stage 3: Secondary | ICD-10-CM | POA: Diagnosis not present

## 2014-07-27 DIAGNOSIS — L89892 Pressure ulcer of other site, stage 2: Secondary | ICD-10-CM | POA: Diagnosis not present

## 2014-08-02 DIAGNOSIS — L89893 Pressure ulcer of other site, stage 3: Secondary | ICD-10-CM | POA: Diagnosis not present

## 2014-08-02 DIAGNOSIS — L89892 Pressure ulcer of other site, stage 2: Secondary | ICD-10-CM | POA: Diagnosis not present

## 2014-08-02 DIAGNOSIS — G8929 Other chronic pain: Secondary | ICD-10-CM | POA: Diagnosis not present

## 2014-08-02 DIAGNOSIS — M2450 Contracture, unspecified joint: Secondary | ICD-10-CM | POA: Diagnosis not present

## 2014-08-02 DIAGNOSIS — G35 Multiple sclerosis: Secondary | ICD-10-CM | POA: Diagnosis not present

## 2014-08-02 DIAGNOSIS — I10 Essential (primary) hypertension: Secondary | ICD-10-CM | POA: Diagnosis not present

## 2014-08-10 DIAGNOSIS — M2450 Contracture, unspecified joint: Secondary | ICD-10-CM | POA: Diagnosis not present

## 2014-08-10 DIAGNOSIS — I10 Essential (primary) hypertension: Secondary | ICD-10-CM | POA: Diagnosis not present

## 2014-08-10 DIAGNOSIS — L89892 Pressure ulcer of other site, stage 2: Secondary | ICD-10-CM | POA: Diagnosis not present

## 2014-08-10 DIAGNOSIS — G35 Multiple sclerosis: Secondary | ICD-10-CM | POA: Diagnosis not present

## 2014-08-10 DIAGNOSIS — G8929 Other chronic pain: Secondary | ICD-10-CM | POA: Diagnosis not present

## 2014-08-10 DIAGNOSIS — L89893 Pressure ulcer of other site, stage 3: Secondary | ICD-10-CM | POA: Diagnosis not present

## 2014-08-16 DIAGNOSIS — I10 Essential (primary) hypertension: Secondary | ICD-10-CM | POA: Diagnosis not present

## 2014-08-17 DIAGNOSIS — G8929 Other chronic pain: Secondary | ICD-10-CM | POA: Diagnosis not present

## 2014-08-17 DIAGNOSIS — L89893 Pressure ulcer of other site, stage 3: Secondary | ICD-10-CM | POA: Diagnosis not present

## 2014-08-17 DIAGNOSIS — M2450 Contracture, unspecified joint: Secondary | ICD-10-CM | POA: Diagnosis not present

## 2014-08-17 DIAGNOSIS — L89892 Pressure ulcer of other site, stage 2: Secondary | ICD-10-CM | POA: Diagnosis not present

## 2014-08-17 DIAGNOSIS — I10 Essential (primary) hypertension: Secondary | ICD-10-CM | POA: Diagnosis not present

## 2014-08-17 DIAGNOSIS — G35 Multiple sclerosis: Secondary | ICD-10-CM | POA: Diagnosis not present

## 2014-08-24 DIAGNOSIS — I10 Essential (primary) hypertension: Secondary | ICD-10-CM | POA: Diagnosis not present

## 2014-08-24 DIAGNOSIS — M2450 Contracture, unspecified joint: Secondary | ICD-10-CM | POA: Diagnosis not present

## 2014-08-24 DIAGNOSIS — L89892 Pressure ulcer of other site, stage 2: Secondary | ICD-10-CM | POA: Diagnosis not present

## 2014-08-24 DIAGNOSIS — G35 Multiple sclerosis: Secondary | ICD-10-CM | POA: Diagnosis not present

## 2014-08-24 DIAGNOSIS — L89893 Pressure ulcer of other site, stage 3: Secondary | ICD-10-CM | POA: Diagnosis not present

## 2014-08-24 DIAGNOSIS — G8929 Other chronic pain: Secondary | ICD-10-CM | POA: Diagnosis not present

## 2014-08-31 DIAGNOSIS — L89892 Pressure ulcer of other site, stage 2: Secondary | ICD-10-CM | POA: Diagnosis not present

## 2014-08-31 DIAGNOSIS — G8929 Other chronic pain: Secondary | ICD-10-CM | POA: Diagnosis not present

## 2014-08-31 DIAGNOSIS — L89893 Pressure ulcer of other site, stage 3: Secondary | ICD-10-CM | POA: Diagnosis not present

## 2014-08-31 DIAGNOSIS — G35 Multiple sclerosis: Secondary | ICD-10-CM | POA: Diagnosis not present

## 2014-08-31 DIAGNOSIS — I10 Essential (primary) hypertension: Secondary | ICD-10-CM | POA: Diagnosis not present

## 2014-08-31 DIAGNOSIS — M2450 Contracture, unspecified joint: Secondary | ICD-10-CM | POA: Diagnosis not present

## 2014-09-06 DIAGNOSIS — L89893 Pressure ulcer of other site, stage 3: Secondary | ICD-10-CM | POA: Diagnosis not present

## 2014-09-06 DIAGNOSIS — L89892 Pressure ulcer of other site, stage 2: Secondary | ICD-10-CM | POA: Diagnosis not present

## 2014-09-06 DIAGNOSIS — G8929 Other chronic pain: Secondary | ICD-10-CM | POA: Diagnosis not present

## 2014-09-06 DIAGNOSIS — M2450 Contracture, unspecified joint: Secondary | ICD-10-CM | POA: Diagnosis not present

## 2014-09-06 DIAGNOSIS — G35 Multiple sclerosis: Secondary | ICD-10-CM | POA: Diagnosis not present

## 2014-09-06 DIAGNOSIS — I10 Essential (primary) hypertension: Secondary | ICD-10-CM | POA: Diagnosis not present

## 2014-09-13 DIAGNOSIS — I10 Essential (primary) hypertension: Secondary | ICD-10-CM | POA: Diagnosis not present

## 2014-09-13 DIAGNOSIS — M2450 Contracture, unspecified joint: Secondary | ICD-10-CM | POA: Diagnosis not present

## 2014-09-13 DIAGNOSIS — G35 Multiple sclerosis: Secondary | ICD-10-CM | POA: Diagnosis not present

## 2014-09-13 DIAGNOSIS — L89893 Pressure ulcer of other site, stage 3: Secondary | ICD-10-CM | POA: Diagnosis not present

## 2014-09-13 DIAGNOSIS — L89892 Pressure ulcer of other site, stage 2: Secondary | ICD-10-CM | POA: Diagnosis not present

## 2014-09-13 DIAGNOSIS — G8929 Other chronic pain: Secondary | ICD-10-CM | POA: Diagnosis not present

## 2014-09-16 DIAGNOSIS — M2450 Contracture, unspecified joint: Secondary | ICD-10-CM | POA: Diagnosis not present

## 2014-09-16 DIAGNOSIS — G8929 Other chronic pain: Secondary | ICD-10-CM | POA: Diagnosis not present

## 2014-09-16 DIAGNOSIS — I10 Essential (primary) hypertension: Secondary | ICD-10-CM | POA: Diagnosis not present

## 2014-09-16 DIAGNOSIS — R32 Unspecified urinary incontinence: Secondary | ICD-10-CM | POA: Diagnosis not present

## 2014-09-16 DIAGNOSIS — L89892 Pressure ulcer of other site, stage 2: Secondary | ICD-10-CM | POA: Diagnosis not present

## 2014-09-16 DIAGNOSIS — G35 Multiple sclerosis: Secondary | ICD-10-CM | POA: Diagnosis not present

## 2014-09-16 DIAGNOSIS — L89893 Pressure ulcer of other site, stage 3: Secondary | ICD-10-CM | POA: Diagnosis not present

## 2014-09-21 DIAGNOSIS — M2450 Contracture, unspecified joint: Secondary | ICD-10-CM | POA: Diagnosis not present

## 2014-09-21 DIAGNOSIS — I10 Essential (primary) hypertension: Secondary | ICD-10-CM | POA: Diagnosis not present

## 2014-09-21 DIAGNOSIS — L89892 Pressure ulcer of other site, stage 2: Secondary | ICD-10-CM | POA: Diagnosis not present

## 2014-09-21 DIAGNOSIS — G8929 Other chronic pain: Secondary | ICD-10-CM | POA: Diagnosis not present

## 2014-09-21 DIAGNOSIS — G35 Multiple sclerosis: Secondary | ICD-10-CM | POA: Diagnosis not present

## 2014-09-21 DIAGNOSIS — L89893 Pressure ulcer of other site, stage 3: Secondary | ICD-10-CM | POA: Diagnosis not present

## 2014-09-28 DIAGNOSIS — M2450 Contracture, unspecified joint: Secondary | ICD-10-CM | POA: Diagnosis not present

## 2014-09-28 DIAGNOSIS — G8929 Other chronic pain: Secondary | ICD-10-CM | POA: Diagnosis not present

## 2014-09-28 DIAGNOSIS — L89892 Pressure ulcer of other site, stage 2: Secondary | ICD-10-CM | POA: Diagnosis not present

## 2014-09-28 DIAGNOSIS — G35 Multiple sclerosis: Secondary | ICD-10-CM | POA: Diagnosis not present

## 2014-09-28 DIAGNOSIS — I10 Essential (primary) hypertension: Secondary | ICD-10-CM | POA: Diagnosis not present

## 2014-09-28 DIAGNOSIS — L89893 Pressure ulcer of other site, stage 3: Secondary | ICD-10-CM | POA: Diagnosis not present

## 2014-09-29 DIAGNOSIS — G8929 Other chronic pain: Secondary | ICD-10-CM | POA: Diagnosis not present

## 2014-09-29 DIAGNOSIS — L89893 Pressure ulcer of other site, stage 3: Secondary | ICD-10-CM | POA: Diagnosis not present

## 2014-09-29 DIAGNOSIS — M2450 Contracture, unspecified joint: Secondary | ICD-10-CM | POA: Diagnosis not present

## 2014-09-29 DIAGNOSIS — I10 Essential (primary) hypertension: Secondary | ICD-10-CM | POA: Diagnosis not present

## 2014-09-29 DIAGNOSIS — L89892 Pressure ulcer of other site, stage 2: Secondary | ICD-10-CM | POA: Diagnosis not present

## 2014-09-29 DIAGNOSIS — G35 Multiple sclerosis: Secondary | ICD-10-CM | POA: Diagnosis not present

## 2014-10-05 DIAGNOSIS — L89892 Pressure ulcer of other site, stage 2: Secondary | ICD-10-CM | POA: Diagnosis not present

## 2014-10-05 DIAGNOSIS — L89893 Pressure ulcer of other site, stage 3: Secondary | ICD-10-CM | POA: Diagnosis not present

## 2014-10-05 DIAGNOSIS — M2450 Contracture, unspecified joint: Secondary | ICD-10-CM | POA: Diagnosis not present

## 2014-10-05 DIAGNOSIS — G8929 Other chronic pain: Secondary | ICD-10-CM | POA: Diagnosis not present

## 2014-10-05 DIAGNOSIS — G35 Multiple sclerosis: Secondary | ICD-10-CM | POA: Diagnosis not present

## 2014-10-05 DIAGNOSIS — I10 Essential (primary) hypertension: Secondary | ICD-10-CM | POA: Diagnosis not present

## 2014-10-11 DIAGNOSIS — L89892 Pressure ulcer of other site, stage 2: Secondary | ICD-10-CM | POA: Diagnosis not present

## 2014-10-11 DIAGNOSIS — L89893 Pressure ulcer of other site, stage 3: Secondary | ICD-10-CM | POA: Diagnosis not present

## 2014-10-11 DIAGNOSIS — G35 Multiple sclerosis: Secondary | ICD-10-CM | POA: Diagnosis not present

## 2014-10-11 DIAGNOSIS — G8929 Other chronic pain: Secondary | ICD-10-CM | POA: Diagnosis not present

## 2014-10-11 DIAGNOSIS — I10 Essential (primary) hypertension: Secondary | ICD-10-CM | POA: Diagnosis not present

## 2014-10-11 DIAGNOSIS — M2450 Contracture, unspecified joint: Secondary | ICD-10-CM | POA: Diagnosis not present

## 2014-10-18 DIAGNOSIS — G35 Multiple sclerosis: Secondary | ICD-10-CM | POA: Diagnosis not present

## 2014-10-18 DIAGNOSIS — L89892 Pressure ulcer of other site, stage 2: Secondary | ICD-10-CM | POA: Diagnosis not present

## 2014-10-18 DIAGNOSIS — M2450 Contracture, unspecified joint: Secondary | ICD-10-CM | POA: Diagnosis not present

## 2014-10-18 DIAGNOSIS — I10 Essential (primary) hypertension: Secondary | ICD-10-CM | POA: Diagnosis not present

## 2014-10-18 DIAGNOSIS — L89893 Pressure ulcer of other site, stage 3: Secondary | ICD-10-CM | POA: Diagnosis not present

## 2014-10-18 DIAGNOSIS — G8929 Other chronic pain: Secondary | ICD-10-CM | POA: Diagnosis not present

## 2014-10-25 DIAGNOSIS — G35 Multiple sclerosis: Secondary | ICD-10-CM | POA: Diagnosis not present

## 2014-10-25 DIAGNOSIS — G8929 Other chronic pain: Secondary | ICD-10-CM | POA: Diagnosis not present

## 2014-10-25 DIAGNOSIS — M2450 Contracture, unspecified joint: Secondary | ICD-10-CM | POA: Diagnosis not present

## 2014-10-25 DIAGNOSIS — L89892 Pressure ulcer of other site, stage 2: Secondary | ICD-10-CM | POA: Diagnosis not present

## 2014-10-25 DIAGNOSIS — I10 Essential (primary) hypertension: Secondary | ICD-10-CM | POA: Diagnosis not present

## 2014-10-25 DIAGNOSIS — L89893 Pressure ulcer of other site, stage 3: Secondary | ICD-10-CM | POA: Diagnosis not present

## 2014-11-03 DIAGNOSIS — L89892 Pressure ulcer of other site, stage 2: Secondary | ICD-10-CM | POA: Diagnosis not present

## 2014-11-03 DIAGNOSIS — G35 Multiple sclerosis: Secondary | ICD-10-CM | POA: Diagnosis not present

## 2014-11-03 DIAGNOSIS — I10 Essential (primary) hypertension: Secondary | ICD-10-CM | POA: Diagnosis not present

## 2014-11-03 DIAGNOSIS — M2450 Contracture, unspecified joint: Secondary | ICD-10-CM | POA: Diagnosis not present

## 2014-11-03 DIAGNOSIS — L89893 Pressure ulcer of other site, stage 3: Secondary | ICD-10-CM | POA: Diagnosis not present

## 2014-11-03 DIAGNOSIS — G8929 Other chronic pain: Secondary | ICD-10-CM | POA: Diagnosis not present

## 2014-11-11 DIAGNOSIS — G35 Multiple sclerosis: Secondary | ICD-10-CM | POA: Diagnosis not present

## 2014-11-11 DIAGNOSIS — M2450 Contracture, unspecified joint: Secondary | ICD-10-CM | POA: Diagnosis not present

## 2014-11-11 DIAGNOSIS — I10 Essential (primary) hypertension: Secondary | ICD-10-CM | POA: Diagnosis not present

## 2014-11-11 DIAGNOSIS — L89893 Pressure ulcer of other site, stage 3: Secondary | ICD-10-CM | POA: Diagnosis not present

## 2014-11-11 DIAGNOSIS — L89892 Pressure ulcer of other site, stage 2: Secondary | ICD-10-CM | POA: Diagnosis not present

## 2014-11-11 DIAGNOSIS — G8929 Other chronic pain: Secondary | ICD-10-CM | POA: Diagnosis not present

## 2014-11-15 DIAGNOSIS — M2450 Contracture, unspecified joint: Secondary | ICD-10-CM | POA: Diagnosis not present

## 2014-11-15 DIAGNOSIS — L89893 Pressure ulcer of other site, stage 3: Secondary | ICD-10-CM | POA: Diagnosis not present

## 2014-11-15 DIAGNOSIS — G8929 Other chronic pain: Secondary | ICD-10-CM | POA: Diagnosis not present

## 2014-11-15 DIAGNOSIS — G35 Multiple sclerosis: Secondary | ICD-10-CM | POA: Diagnosis not present

## 2014-11-15 DIAGNOSIS — R32 Unspecified urinary incontinence: Secondary | ICD-10-CM | POA: Diagnosis not present

## 2014-11-15 DIAGNOSIS — I10 Essential (primary) hypertension: Secondary | ICD-10-CM | POA: Diagnosis not present

## 2014-11-16 DIAGNOSIS — R32 Unspecified urinary incontinence: Secondary | ICD-10-CM | POA: Diagnosis not present

## 2014-11-16 DIAGNOSIS — I10 Essential (primary) hypertension: Secondary | ICD-10-CM | POA: Diagnosis not present

## 2014-11-16 DIAGNOSIS — M2450 Contracture, unspecified joint: Secondary | ICD-10-CM | POA: Diagnosis not present

## 2014-11-16 DIAGNOSIS — G35 Multiple sclerosis: Secondary | ICD-10-CM | POA: Diagnosis not present

## 2014-11-16 DIAGNOSIS — L89893 Pressure ulcer of other site, stage 3: Secondary | ICD-10-CM | POA: Diagnosis not present

## 2014-11-16 DIAGNOSIS — G8929 Other chronic pain: Secondary | ICD-10-CM | POA: Diagnosis not present

## 2014-11-17 DIAGNOSIS — M2450 Contracture, unspecified joint: Secondary | ICD-10-CM | POA: Diagnosis not present

## 2014-11-17 DIAGNOSIS — G8929 Other chronic pain: Secondary | ICD-10-CM | POA: Diagnosis not present

## 2014-11-17 DIAGNOSIS — I10 Essential (primary) hypertension: Secondary | ICD-10-CM | POA: Diagnosis not present

## 2014-11-17 DIAGNOSIS — L89893 Pressure ulcer of other site, stage 3: Secondary | ICD-10-CM | POA: Diagnosis not present

## 2014-11-17 DIAGNOSIS — R32 Unspecified urinary incontinence: Secondary | ICD-10-CM | POA: Diagnosis not present

## 2014-11-17 DIAGNOSIS — G35 Multiple sclerosis: Secondary | ICD-10-CM | POA: Diagnosis not present

## 2014-11-19 DIAGNOSIS — Z0000 Encounter for general adult medical examination without abnormal findings: Secondary | ICD-10-CM | POA: Diagnosis not present

## 2014-11-19 DIAGNOSIS — Z131 Encounter for screening for diabetes mellitus: Secondary | ICD-10-CM | POA: Diagnosis not present

## 2014-11-19 DIAGNOSIS — I10 Essential (primary) hypertension: Secondary | ICD-10-CM | POA: Diagnosis not present

## 2014-11-19 DIAGNOSIS — Z1389 Encounter for screening for other disorder: Secondary | ICD-10-CM | POA: Diagnosis not present

## 2014-11-23 DIAGNOSIS — I10 Essential (primary) hypertension: Secondary | ICD-10-CM | POA: Diagnosis not present

## 2014-11-23 DIAGNOSIS — R32 Unspecified urinary incontinence: Secondary | ICD-10-CM | POA: Diagnosis not present

## 2014-11-23 DIAGNOSIS — L89893 Pressure ulcer of other site, stage 3: Secondary | ICD-10-CM | POA: Diagnosis not present

## 2014-11-23 DIAGNOSIS — G35 Multiple sclerosis: Secondary | ICD-10-CM | POA: Diagnosis not present

## 2014-11-23 DIAGNOSIS — M2450 Contracture, unspecified joint: Secondary | ICD-10-CM | POA: Diagnosis not present

## 2014-11-23 DIAGNOSIS — G8929 Other chronic pain: Secondary | ICD-10-CM | POA: Diagnosis not present

## 2014-11-30 DIAGNOSIS — L89893 Pressure ulcer of other site, stage 3: Secondary | ICD-10-CM | POA: Diagnosis not present

## 2014-11-30 DIAGNOSIS — M2450 Contracture, unspecified joint: Secondary | ICD-10-CM | POA: Diagnosis not present

## 2014-11-30 DIAGNOSIS — R32 Unspecified urinary incontinence: Secondary | ICD-10-CM | POA: Diagnosis not present

## 2014-11-30 DIAGNOSIS — I10 Essential (primary) hypertension: Secondary | ICD-10-CM | POA: Diagnosis not present

## 2014-11-30 DIAGNOSIS — G8929 Other chronic pain: Secondary | ICD-10-CM | POA: Diagnosis not present

## 2014-11-30 DIAGNOSIS — G35 Multiple sclerosis: Secondary | ICD-10-CM | POA: Diagnosis not present

## 2014-12-07 DIAGNOSIS — I10 Essential (primary) hypertension: Secondary | ICD-10-CM | POA: Diagnosis not present

## 2014-12-07 DIAGNOSIS — G35 Multiple sclerosis: Secondary | ICD-10-CM | POA: Diagnosis not present

## 2014-12-07 DIAGNOSIS — M2450 Contracture, unspecified joint: Secondary | ICD-10-CM | POA: Diagnosis not present

## 2014-12-07 DIAGNOSIS — G8929 Other chronic pain: Secondary | ICD-10-CM | POA: Diagnosis not present

## 2014-12-07 DIAGNOSIS — L89893 Pressure ulcer of other site, stage 3: Secondary | ICD-10-CM | POA: Diagnosis not present

## 2014-12-07 DIAGNOSIS — R32 Unspecified urinary incontinence: Secondary | ICD-10-CM | POA: Diagnosis not present

## 2014-12-14 DIAGNOSIS — L89893 Pressure ulcer of other site, stage 3: Secondary | ICD-10-CM | POA: Diagnosis not present

## 2014-12-14 DIAGNOSIS — I10 Essential (primary) hypertension: Secondary | ICD-10-CM | POA: Diagnosis not present

## 2014-12-14 DIAGNOSIS — M2450 Contracture, unspecified joint: Secondary | ICD-10-CM | POA: Diagnosis not present

## 2014-12-14 DIAGNOSIS — R32 Unspecified urinary incontinence: Secondary | ICD-10-CM | POA: Diagnosis not present

## 2014-12-14 DIAGNOSIS — G8929 Other chronic pain: Secondary | ICD-10-CM | POA: Diagnosis not present

## 2014-12-14 DIAGNOSIS — G35 Multiple sclerosis: Secondary | ICD-10-CM | POA: Diagnosis not present

## 2014-12-28 DIAGNOSIS — R32 Unspecified urinary incontinence: Secondary | ICD-10-CM | POA: Diagnosis not present

## 2014-12-28 DIAGNOSIS — G35 Multiple sclerosis: Secondary | ICD-10-CM | POA: Diagnosis not present

## 2014-12-28 DIAGNOSIS — L89893 Pressure ulcer of other site, stage 3: Secondary | ICD-10-CM | POA: Diagnosis not present

## 2014-12-28 DIAGNOSIS — M2450 Contracture, unspecified joint: Secondary | ICD-10-CM | POA: Diagnosis not present

## 2014-12-28 DIAGNOSIS — I10 Essential (primary) hypertension: Secondary | ICD-10-CM | POA: Diagnosis not present

## 2014-12-28 DIAGNOSIS — G8929 Other chronic pain: Secondary | ICD-10-CM | POA: Diagnosis not present

## 2015-01-12 DIAGNOSIS — I10 Essential (primary) hypertension: Secondary | ICD-10-CM | POA: Diagnosis not present

## 2015-01-12 DIAGNOSIS — M2450 Contracture, unspecified joint: Secondary | ICD-10-CM | POA: Diagnosis not present

## 2015-01-12 DIAGNOSIS — L89893 Pressure ulcer of other site, stage 3: Secondary | ICD-10-CM | POA: Diagnosis not present

## 2015-01-12 DIAGNOSIS — G35 Multiple sclerosis: Secondary | ICD-10-CM | POA: Diagnosis not present

## 2015-01-12 DIAGNOSIS — R32 Unspecified urinary incontinence: Secondary | ICD-10-CM | POA: Diagnosis not present

## 2015-01-12 DIAGNOSIS — G8929 Other chronic pain: Secondary | ICD-10-CM | POA: Diagnosis not present

## 2015-04-14 DIAGNOSIS — I10 Essential (primary) hypertension: Secondary | ICD-10-CM | POA: Diagnosis not present

## 2015-04-14 DIAGNOSIS — F0390 Unspecified dementia without behavioral disturbance: Secondary | ICD-10-CM | POA: Diagnosis not present

## 2015-05-14 DIAGNOSIS — G8929 Other chronic pain: Secondary | ICD-10-CM | POA: Diagnosis not present

## 2015-05-14 DIAGNOSIS — I10 Essential (primary) hypertension: Secondary | ICD-10-CM | POA: Diagnosis not present

## 2015-05-14 DIAGNOSIS — R32 Unspecified urinary incontinence: Secondary | ICD-10-CM | POA: Diagnosis not present

## 2015-05-14 DIAGNOSIS — G35 Multiple sclerosis: Secondary | ICD-10-CM | POA: Diagnosis not present

## 2015-05-14 DIAGNOSIS — M2450 Contracture, unspecified joint: Secondary | ICD-10-CM | POA: Diagnosis not present

## 2015-07-13 DIAGNOSIS — G8929 Other chronic pain: Secondary | ICD-10-CM | POA: Diagnosis not present

## 2015-07-13 DIAGNOSIS — G35 Multiple sclerosis: Secondary | ICD-10-CM | POA: Diagnosis not present

## 2015-07-13 DIAGNOSIS — I10 Essential (primary) hypertension: Secondary | ICD-10-CM | POA: Diagnosis not present

## 2015-07-13 DIAGNOSIS — N39498 Other specified urinary incontinence: Secondary | ICD-10-CM | POA: Diagnosis not present

## 2015-08-22 DIAGNOSIS — F0280 Dementia in other diseases classified elsewhere without behavioral disturbance: Secondary | ICD-10-CM | POA: Diagnosis not present

## 2015-08-22 DIAGNOSIS — I10 Essential (primary) hypertension: Secondary | ICD-10-CM | POA: Diagnosis not present

## 2015-09-11 DIAGNOSIS — I10 Essential (primary) hypertension: Secondary | ICD-10-CM | POA: Diagnosis not present

## 2015-09-11 DIAGNOSIS — G35 Multiple sclerosis: Secondary | ICD-10-CM | POA: Diagnosis not present

## 2015-09-11 DIAGNOSIS — N39498 Other specified urinary incontinence: Secondary | ICD-10-CM | POA: Diagnosis not present

## 2015-09-11 DIAGNOSIS — G8929 Other chronic pain: Secondary | ICD-10-CM | POA: Diagnosis not present

## 2015-12-19 DIAGNOSIS — I10 Essential (primary) hypertension: Secondary | ICD-10-CM | POA: Diagnosis not present

## 2015-12-19 DIAGNOSIS — Z131 Encounter for screening for diabetes mellitus: Secondary | ICD-10-CM | POA: Diagnosis not present

## 2015-12-19 DIAGNOSIS — F0280 Dementia in other diseases classified elsewhere without behavioral disturbance: Secondary | ICD-10-CM | POA: Diagnosis not present

## 2016-06-08 DIAGNOSIS — Z131 Encounter for screening for diabetes mellitus: Secondary | ICD-10-CM | POA: Diagnosis not present

## 2016-06-08 DIAGNOSIS — F0280 Dementia in other diseases classified elsewhere without behavioral disturbance: Secondary | ICD-10-CM | POA: Diagnosis not present

## 2016-06-08 DIAGNOSIS — I10 Essential (primary) hypertension: Secondary | ICD-10-CM | POA: Diagnosis not present

## 2016-06-08 DIAGNOSIS — Z1389 Encounter for screening for other disorder: Secondary | ICD-10-CM | POA: Diagnosis not present

## 2016-06-08 DIAGNOSIS — Z0000 Encounter for general adult medical examination without abnormal findings: Secondary | ICD-10-CM | POA: Diagnosis not present

## 2016-10-22 DIAGNOSIS — G8929 Other chronic pain: Secondary | ICD-10-CM | POA: Diagnosis not present

## 2016-10-22 DIAGNOSIS — I10 Essential (primary) hypertension: Secondary | ICD-10-CM | POA: Diagnosis not present

## 2016-10-22 DIAGNOSIS — F0280 Dementia in other diseases classified elsewhere without behavioral disturbance: Secondary | ICD-10-CM | POA: Diagnosis not present

## 2017-02-22 DIAGNOSIS — G8929 Other chronic pain: Secondary | ICD-10-CM | POA: Diagnosis not present

## 2017-02-22 DIAGNOSIS — Z79899 Other long term (current) drug therapy: Secondary | ICD-10-CM | POA: Diagnosis not present

## 2017-02-22 DIAGNOSIS — F0280 Dementia in other diseases classified elsewhere without behavioral disturbance: Secondary | ICD-10-CM | POA: Diagnosis not present

## 2017-02-22 DIAGNOSIS — I10 Essential (primary) hypertension: Secondary | ICD-10-CM | POA: Diagnosis not present

## 2017-06-03 DIAGNOSIS — I10 Essential (primary) hypertension: Secondary | ICD-10-CM | POA: Diagnosis not present

## 2017-06-03 DIAGNOSIS — G8929 Other chronic pain: Secondary | ICD-10-CM | POA: Diagnosis not present

## 2017-06-03 DIAGNOSIS — F0280 Dementia in other diseases classified elsewhere without behavioral disturbance: Secondary | ICD-10-CM | POA: Diagnosis not present

## 2017-09-09 DIAGNOSIS — I10 Essential (primary) hypertension: Secondary | ICD-10-CM | POA: Diagnosis not present

## 2017-09-09 DIAGNOSIS — J301 Allergic rhinitis due to pollen: Secondary | ICD-10-CM | POA: Diagnosis not present

## 2017-09-09 DIAGNOSIS — F0280 Dementia in other diseases classified elsewhere without behavioral disturbance: Secondary | ICD-10-CM | POA: Diagnosis not present

## 2017-12-24 DIAGNOSIS — Z1389 Encounter for screening for other disorder: Secondary | ICD-10-CM | POA: Diagnosis not present

## 2017-12-24 DIAGNOSIS — F0280 Dementia in other diseases classified elsewhere without behavioral disturbance: Secondary | ICD-10-CM | POA: Diagnosis not present

## 2017-12-24 DIAGNOSIS — I10 Essential (primary) hypertension: Secondary | ICD-10-CM | POA: Diagnosis not present

## 2017-12-24 DIAGNOSIS — Z0000 Encounter for general adult medical examination without abnormal findings: Secondary | ICD-10-CM | POA: Diagnosis not present

## 2017-12-24 DIAGNOSIS — J301 Allergic rhinitis due to pollen: Secondary | ICD-10-CM | POA: Diagnosis not present

## 2018-03-27 DIAGNOSIS — I10 Essential (primary) hypertension: Secondary | ICD-10-CM | POA: Diagnosis not present

## 2018-03-27 DIAGNOSIS — Z0000 Encounter for general adult medical examination without abnormal findings: Secondary | ICD-10-CM | POA: Diagnosis not present

## 2018-03-27 DIAGNOSIS — F0280 Dementia in other diseases classified elsewhere without behavioral disturbance: Secondary | ICD-10-CM | POA: Diagnosis not present

## 2018-03-27 DIAGNOSIS — J301 Allergic rhinitis due to pollen: Secondary | ICD-10-CM | POA: Diagnosis not present

## 2018-03-27 DIAGNOSIS — Z1389 Encounter for screening for other disorder: Secondary | ICD-10-CM | POA: Diagnosis not present

## 2018-06-18 DIAGNOSIS — G8222 Paraplegia, incomplete: Secondary | ICD-10-CM | POA: Diagnosis not present

## 2018-06-18 DIAGNOSIS — J301 Allergic rhinitis due to pollen: Secondary | ICD-10-CM | POA: Diagnosis not present

## 2018-06-18 DIAGNOSIS — M6249 Contracture of muscle, multiple sites: Secondary | ICD-10-CM | POA: Diagnosis not present

## 2018-06-18 DIAGNOSIS — I10 Essential (primary) hypertension: Secondary | ICD-10-CM | POA: Diagnosis not present

## 2018-06-18 DIAGNOSIS — F0280 Dementia in other diseases classified elsewhere without behavioral disturbance: Secondary | ICD-10-CM | POA: Diagnosis not present

## 2018-06-18 DIAGNOSIS — M4805 Spinal stenosis, thoracolumbar region: Secondary | ICD-10-CM | POA: Diagnosis not present

## 2018-09-22 DIAGNOSIS — F0280 Dementia in other diseases classified elsewhere without behavioral disturbance: Secondary | ICD-10-CM | POA: Diagnosis not present

## 2018-09-22 DIAGNOSIS — G8222 Paraplegia, incomplete: Secondary | ICD-10-CM | POA: Diagnosis not present

## 2018-09-22 DIAGNOSIS — M4805 Spinal stenosis, thoracolumbar region: Secondary | ICD-10-CM | POA: Diagnosis not present

## 2018-09-22 DIAGNOSIS — I10 Essential (primary) hypertension: Secondary | ICD-10-CM | POA: Diagnosis not present

## 2018-09-22 DIAGNOSIS — M6249 Contracture of muscle, multiple sites: Secondary | ICD-10-CM | POA: Diagnosis not present

## 2018-09-22 DIAGNOSIS — J301 Allergic rhinitis due to pollen: Secondary | ICD-10-CM | POA: Diagnosis not present

## 2019-01-06 DIAGNOSIS — G8222 Paraplegia, incomplete: Secondary | ICD-10-CM | POA: Diagnosis not present

## 2019-01-06 DIAGNOSIS — M4805 Spinal stenosis, thoracolumbar region: Secondary | ICD-10-CM | POA: Diagnosis not present

## 2019-01-06 DIAGNOSIS — I10 Essential (primary) hypertension: Secondary | ICD-10-CM | POA: Diagnosis not present

## 2019-01-06 DIAGNOSIS — J301 Allergic rhinitis due to pollen: Secondary | ICD-10-CM | POA: Diagnosis not present

## 2019-01-06 DIAGNOSIS — M6249 Contracture of muscle, multiple sites: Secondary | ICD-10-CM | POA: Diagnosis not present

## 2019-01-06 DIAGNOSIS — F0280 Dementia in other diseases classified elsewhere without behavioral disturbance: Secondary | ICD-10-CM | POA: Diagnosis not present

## 2019-04-14 DIAGNOSIS — I10 Essential (primary) hypertension: Secondary | ICD-10-CM | POA: Diagnosis not present

## 2019-04-14 DIAGNOSIS — Z0000 Encounter for general adult medical examination without abnormal findings: Secondary | ICD-10-CM | POA: Diagnosis not present

## 2019-04-14 DIAGNOSIS — G8222 Paraplegia, incomplete: Secondary | ICD-10-CM | POA: Diagnosis not present

## 2019-04-14 DIAGNOSIS — F0280 Dementia in other diseases classified elsewhere without behavioral disturbance: Secondary | ICD-10-CM | POA: Diagnosis not present

## 2019-04-14 DIAGNOSIS — Z1389 Encounter for screening for other disorder: Secondary | ICD-10-CM | POA: Diagnosis not present

## 2019-04-14 DIAGNOSIS — M4805 Spinal stenosis, thoracolumbar region: Secondary | ICD-10-CM | POA: Diagnosis not present

## 2019-04-14 DIAGNOSIS — M6249 Contracture of muscle, multiple sites: Secondary | ICD-10-CM | POA: Diagnosis not present

## 2019-04-14 DIAGNOSIS — J301 Allergic rhinitis due to pollen: Secondary | ICD-10-CM | POA: Diagnosis not present

## 2019-06-08 ENCOUNTER — Other Ambulatory Visit: Payer: Self-pay

## 2019-07-22 DIAGNOSIS — I10 Essential (primary) hypertension: Secondary | ICD-10-CM | POA: Diagnosis not present

## 2019-07-22 DIAGNOSIS — G8222 Paraplegia, incomplete: Secondary | ICD-10-CM | POA: Diagnosis not present

## 2019-07-22 DIAGNOSIS — J301 Allergic rhinitis due to pollen: Secondary | ICD-10-CM | POA: Diagnosis not present

## 2019-07-22 DIAGNOSIS — M4805 Spinal stenosis, thoracolumbar region: Secondary | ICD-10-CM | POA: Diagnosis not present

## 2019-07-22 DIAGNOSIS — F0280 Dementia in other diseases classified elsewhere without behavioral disturbance: Secondary | ICD-10-CM | POA: Diagnosis not present

## 2019-07-22 DIAGNOSIS — M6249 Contracture of muscle, multiple sites: Secondary | ICD-10-CM | POA: Diagnosis not present

## 2020-01-11 DIAGNOSIS — L89899 Pressure ulcer of other site, unspecified stage: Secondary | ICD-10-CM | POA: Diagnosis not present

## 2020-01-27 DIAGNOSIS — Z0000 Encounter for general adult medical examination without abnormal findings: Secondary | ICD-10-CM | POA: Diagnosis not present

## 2020-01-27 DIAGNOSIS — I10 Essential (primary) hypertension: Secondary | ICD-10-CM | POA: Diagnosis not present

## 2020-01-27 DIAGNOSIS — L89899 Pressure ulcer of other site, unspecified stage: Secondary | ICD-10-CM | POA: Diagnosis not present

## 2020-01-27 DIAGNOSIS — G8222 Paraplegia, incomplete: Secondary | ICD-10-CM | POA: Diagnosis not present

## 2020-01-27 DIAGNOSIS — F0390 Unspecified dementia without behavioral disturbance: Secondary | ICD-10-CM | POA: Diagnosis not present

## 2020-03-28 DIAGNOSIS — G8222 Paraplegia, incomplete: Secondary | ICD-10-CM | POA: Diagnosis not present

## 2020-03-28 DIAGNOSIS — L89899 Pressure ulcer of other site, unspecified stage: Secondary | ICD-10-CM | POA: Diagnosis not present

## 2020-03-28 DIAGNOSIS — F0390 Unspecified dementia without behavioral disturbance: Secondary | ICD-10-CM | POA: Diagnosis not present

## 2020-03-28 DIAGNOSIS — I10 Essential (primary) hypertension: Secondary | ICD-10-CM | POA: Diagnosis not present

## 2020-05-13 ENCOUNTER — Emergency Department (HOSPITAL_COMMUNITY): Payer: Medicare HMO

## 2020-05-13 ENCOUNTER — Encounter (HOSPITAL_COMMUNITY): Payer: Self-pay | Admitting: *Deleted

## 2020-05-13 ENCOUNTER — Inpatient Hospital Stay (HOSPITAL_COMMUNITY)
Admission: EM | Admit: 2020-05-13 | Discharge: 2020-05-18 | DRG: 871 | Disposition: A | Discharge status: Hospice | Payer: Medicare HMO | Attending: Internal Medicine | Admitting: Internal Medicine

## 2020-05-13 ENCOUNTER — Other Ambulatory Visit: Payer: Self-pay

## 2020-05-13 DIAGNOSIS — L03116 Cellulitis of left lower limb: Secondary | ICD-10-CM | POA: Diagnosis not present

## 2020-05-13 DIAGNOSIS — F329 Major depressive disorder, single episode, unspecified: Secondary | ICD-10-CM | POA: Diagnosis present

## 2020-05-13 DIAGNOSIS — M24551 Contracture, right hip: Secondary | ICD-10-CM | POA: Diagnosis present

## 2020-05-13 DIAGNOSIS — E43 Unspecified severe protein-calorie malnutrition: Secondary | ICD-10-CM | POA: Diagnosis not present

## 2020-05-13 DIAGNOSIS — E872 Acidosis, unspecified: Secondary | ICD-10-CM | POA: Diagnosis present

## 2020-05-13 DIAGNOSIS — M19072 Primary osteoarthritis, left ankle and foot: Secondary | ICD-10-CM | POA: Diagnosis not present

## 2020-05-13 DIAGNOSIS — G9340 Encephalopathy, unspecified: Secondary | ICD-10-CM | POA: Diagnosis not present

## 2020-05-13 DIAGNOSIS — Z6822 Body mass index (BMI) 22.0-22.9, adult: Secondary | ICD-10-CM

## 2020-05-13 DIAGNOSIS — F0390 Unspecified dementia without behavioral disturbance: Secondary | ICD-10-CM | POA: Diagnosis present

## 2020-05-13 DIAGNOSIS — R2242 Localized swelling, mass and lump, left lower limb: Secondary | ICD-10-CM | POA: Diagnosis not present

## 2020-05-13 DIAGNOSIS — M8580 Other specified disorders of bone density and structure, unspecified site: Secondary | ICD-10-CM | POA: Diagnosis present

## 2020-05-13 DIAGNOSIS — A419 Sepsis, unspecified organism: Principal | ICD-10-CM | POA: Diagnosis present

## 2020-05-13 DIAGNOSIS — Z8249 Family history of ischemic heart disease and other diseases of the circulatory system: Secondary | ICD-10-CM

## 2020-05-13 DIAGNOSIS — R000 Tachycardia, unspecified: Secondary | ICD-10-CM | POA: Diagnosis not present

## 2020-05-13 DIAGNOSIS — G35 Multiple sclerosis: Secondary | ICD-10-CM | POA: Diagnosis present

## 2020-05-13 DIAGNOSIS — M24562 Contracture, left knee: Secondary | ICD-10-CM | POA: Diagnosis present

## 2020-05-13 DIAGNOSIS — Z515 Encounter for palliative care: Secondary | ICD-10-CM | POA: Diagnosis not present

## 2020-05-13 DIAGNOSIS — D509 Iron deficiency anemia, unspecified: Secondary | ICD-10-CM | POA: Diagnosis present

## 2020-05-13 DIAGNOSIS — Z7189 Other specified counseling: Secondary | ICD-10-CM | POA: Diagnosis not present

## 2020-05-13 DIAGNOSIS — R64 Cachexia: Secondary | ICD-10-CM | POA: Diagnosis present

## 2020-05-13 DIAGNOSIS — G309 Alzheimer's disease, unspecified: Secondary | ICD-10-CM

## 2020-05-13 DIAGNOSIS — I10 Essential (primary) hypertension: Secondary | ICD-10-CM | POA: Diagnosis present

## 2020-05-13 DIAGNOSIS — L8995 Pressure ulcer of unspecified site, unstageable: Secondary | ICD-10-CM | POA: Diagnosis not present

## 2020-05-13 DIAGNOSIS — L89520 Pressure ulcer of left ankle, unstageable: Secondary | ICD-10-CM | POA: Diagnosis present

## 2020-05-13 DIAGNOSIS — R6520 Severe sepsis without septic shock: Secondary | ICD-10-CM | POA: Diagnosis not present

## 2020-05-13 DIAGNOSIS — L97529 Non-pressure chronic ulcer of other part of left foot with unspecified severity: Secondary | ICD-10-CM | POA: Diagnosis not present

## 2020-05-13 DIAGNOSIS — M24561 Contracture, right knee: Secondary | ICD-10-CM | POA: Diagnosis present

## 2020-05-13 DIAGNOSIS — L89620 Pressure ulcer of left heel, unstageable: Secondary | ICD-10-CM | POA: Diagnosis present

## 2020-05-13 DIAGNOSIS — M86272 Subacute osteomyelitis, left ankle and foot: Secondary | ICD-10-CM

## 2020-05-13 DIAGNOSIS — L89310 Pressure ulcer of right buttock, unstageable: Secondary | ICD-10-CM | POA: Diagnosis present

## 2020-05-13 DIAGNOSIS — F0280 Dementia in other diseases classified elsewhere without behavioral disturbance: Secondary | ICD-10-CM

## 2020-05-13 DIAGNOSIS — Z7401 Bed confinement status: Secondary | ICD-10-CM | POA: Diagnosis not present

## 2020-05-13 DIAGNOSIS — R627 Adult failure to thrive: Secondary | ICD-10-CM | POA: Diagnosis present

## 2020-05-13 DIAGNOSIS — S91301A Unspecified open wound, right foot, initial encounter: Secondary | ICD-10-CM | POA: Diagnosis not present

## 2020-05-13 DIAGNOSIS — Z20822 Contact with and (suspected) exposure to covid-19: Secondary | ICD-10-CM | POA: Diagnosis not present

## 2020-05-13 DIAGNOSIS — L89890 Pressure ulcer of other site, unstageable: Secondary | ICD-10-CM | POA: Diagnosis present

## 2020-05-13 DIAGNOSIS — R5381 Other malaise: Secondary | ICD-10-CM | POA: Diagnosis not present

## 2020-05-13 DIAGNOSIS — L8990 Pressure ulcer of unspecified site, unspecified stage: Secondary | ICD-10-CM

## 2020-05-13 DIAGNOSIS — M24552 Contracture, left hip: Secondary | ICD-10-CM | POA: Diagnosis present

## 2020-05-13 DIAGNOSIS — G8220 Paraplegia, unspecified: Secondary | ICD-10-CM | POA: Diagnosis present

## 2020-05-13 DIAGNOSIS — Z66 Do not resuscitate: Secondary | ICD-10-CM | POA: Diagnosis not present

## 2020-05-13 DIAGNOSIS — E876 Hypokalemia: Secondary | ICD-10-CM | POA: Diagnosis not present

## 2020-05-13 DIAGNOSIS — L89320 Pressure ulcer of left buttock, unstageable: Secondary | ICD-10-CM | POA: Diagnosis present

## 2020-05-13 DIAGNOSIS — M2550 Pain in unspecified joint: Secondary | ICD-10-CM | POA: Diagnosis not present

## 2020-05-13 DIAGNOSIS — S91001A Unspecified open wound, right ankle, initial encounter: Secondary | ICD-10-CM | POA: Diagnosis not present

## 2020-05-13 DIAGNOSIS — I5033 Acute on chronic diastolic (congestive) heart failure: Secondary | ICD-10-CM | POA: Diagnosis not present

## 2020-05-13 DIAGNOSIS — L89510 Pressure ulcer of right ankle, unstageable: Secondary | ICD-10-CM | POA: Diagnosis present

## 2020-05-13 DIAGNOSIS — M7989 Other specified soft tissue disorders: Secondary | ICD-10-CM | POA: Diagnosis not present

## 2020-05-13 LAB — COMPREHENSIVE METABOLIC PANEL
ALT: 9 U/L (ref 0–44)
AST: 13 U/L — ABNORMAL LOW (ref 15–41)
Albumin: 2 g/dL — ABNORMAL LOW (ref 3.5–5.0)
Alkaline Phosphatase: 135 U/L — ABNORMAL HIGH (ref 38–126)
Anion gap: 11 (ref 5–15)
BUN: 20 mg/dL (ref 8–23)
CO2: 21 mmol/L — ABNORMAL LOW (ref 22–32)
Calcium: 8.3 mg/dL — ABNORMAL LOW (ref 8.9–10.3)
Chloride: 103 mmol/L (ref 98–111)
Creatinine, Ser: 0.55 mg/dL (ref 0.44–1.00)
GFR calc Af Amer: >60 mL/min (ref >60)
GFR calc non Af Amer: >60 mL/min (ref >60)
Glucose, Bld: 122 mg/dL — ABNORMAL HIGH (ref 70–99)
Potassium: 3.9 mmol/L (ref 3.5–5.1)
Sodium: 135 mmol/L (ref 135–145)
Total Bilirubin: 0.5 mg/dL (ref 0.3–1.2)
Total Protein: 6.9 g/dL (ref 6.5–8.1)

## 2020-05-13 LAB — APTT: aPTT: 26 seconds (ref 24–36)

## 2020-05-13 LAB — CBC WITH DIFFERENTIAL/PLATELET
Abs Immature Granulocytes: 0.27 10*3/uL — ABNORMAL HIGH (ref 0.00–0.07)
Basophils Absolute: 0 10*3/uL (ref 0.0–0.1)
Basophils Relative: 0 %
Eosinophils Absolute: 0 10*3/uL (ref 0.0–0.5)
Eosinophils Relative: 0 %
HCT: 29.9 % — ABNORMAL LOW (ref 36.0–46.0)
Hemoglobin: 8.4 g/dL — ABNORMAL LOW (ref 12.0–15.0)
Immature Granulocytes: 1 %
Lymphocytes Relative: 7 %
Lymphs Abs: 1.6 10*3/uL (ref 0.7–4.0)
MCH: 24.9 pg — ABNORMAL LOW (ref 26.0–34.0)
MCHC: 28.1 g/dL — ABNORMAL LOW (ref 30.0–36.0)
MCV: 88.5 fL (ref 80.0–100.0)
Monocytes Absolute: 1 10*3/uL (ref 0.1–1.0)
Monocytes Relative: 5 %
Neutro Abs: 19.1 10*3/uL — ABNORMAL HIGH (ref 1.7–7.7)
Neutrophils Relative %: 87 %
Platelets: 192 10*3/uL (ref 150–400)
RBC: 3.38 MIL/uL — ABNORMAL LOW (ref 3.87–5.11)
RDW: 17.5 % — ABNORMAL HIGH (ref 11.5–15.5)
WBC: 22 10*3/uL — ABNORMAL HIGH (ref 4.0–10.5)
nRBC: 0 % (ref 0.0–0.2)

## 2020-05-13 LAB — SARS CORONAVIRUS 2 BY RT PCR (HOSPITAL ORDER, PERFORMED IN ~~LOC~~ HOSPITAL LAB): SARS Coronavirus 2: NEGATIVE

## 2020-05-13 LAB — LACTIC ACID, PLASMA
Lactic Acid, Venous: 2.8 mmol/L (ref 0.5–1.9)
Lactic Acid, Venous: <0.3 mmol/L — ABNORMAL LOW (ref 0.5–1.9)

## 2020-05-13 LAB — MAGNESIUM: Magnesium: 1.9 mg/dL (ref 1.7–2.4)

## 2020-05-13 LAB — PROTIME-INR
INR: 1.3 — ABNORMAL HIGH (ref 0.8–1.2)
Prothrombin Time: 15.7 seconds — ABNORMAL HIGH (ref 11.4–15.2)

## 2020-05-13 LAB — PHOSPHORUS: Phosphorus: 2.5 mg/dL (ref 2.5–4.6)

## 2020-05-13 LAB — VITAMIN B12: Vitamin B-12: 639 pg/mL (ref 180–914)

## 2020-05-13 LAB — TSH: TSH: 0.803 u[IU]/mL (ref 0.350–4.500)

## 2020-05-13 LAB — CBG MONITORING, ED: Glucose-Capillary: 71 mg/dL (ref 70–99)

## 2020-05-13 MED ORDER — PIPERACILLIN-TAZOBACTAM 3.375 G IVPB
3.3750 g | Freq: Three times a day (TID) | INTRAVENOUS | Status: DC
  Administered 2020-05-13 – 2020-05-15 (×6): 3.375 g via INTRAVENOUS
  Filled 2020-05-13 (×6): qty 50

## 2020-05-13 MED ORDER — VANCOMYCIN HCL 1500 MG/300ML IV SOLN
1500.0000 mg | Freq: Once | INTRAVENOUS | Status: AC
  Administered 2020-05-13: 1500 mg via INTRAVENOUS
  Filled 2020-05-13: qty 300

## 2020-05-13 MED ORDER — HEPARIN SODIUM (PORCINE) 5000 UNIT/ML IJ SOLN
5000.0000 [IU] | Freq: Three times a day (TID) | INTRAMUSCULAR | Status: DC
  Administered 2020-05-13 – 2020-05-18 (×14): 5000 [IU] via SUBCUTANEOUS
  Filled 2020-05-13 (×14): qty 1

## 2020-05-13 MED ORDER — VANCOMYCIN HCL IN DEXTROSE 1-5 GM/200ML-% IV SOLN: 1000.0000 mg | Freq: Once | INTRAVENOUS | Status: DC

## 2020-05-13 MED ORDER — CLINDAMYCIN PHOSPHATE 600 MG/50ML IV SOLN: 600.0000 mg | Freq: Once | INTRAVENOUS | Status: DC

## 2020-05-13 MED ORDER — GADOBUTROL 1 MMOL/ML IV SOLN
6.0000 mL | Freq: Once | INTRAVENOUS | Status: AC | PRN
  Administered 2020-05-13: 6 mL via INTRAVENOUS

## 2020-05-13 MED ORDER — SODIUM CHLORIDE 0.9 % IV BOLUS (SEPSIS)
1000.0000 mL | Freq: Once | INTRAVENOUS | Status: AC
  Administered 2020-05-13: 1000 mL via INTRAVENOUS

## 2020-05-13 MED ORDER — DONEPEZIL HCL 10 MG PO TABS
10.0000 mg | ORAL_TABLET | Freq: Every day | ORAL | Status: DC
  Administered 2020-05-13 – 2020-05-17 (×5): 10 mg via ORAL
  Filled 2020-05-13 (×8): qty 1

## 2020-05-13 MED ORDER — ACETAMINOPHEN 325 MG PO TABS: 650.0000 mg | ORAL_TABLET | Freq: Four times a day (QID) | ORAL | Status: DC | PRN

## 2020-05-13 MED ORDER — SODIUM CHLORIDE 0.9 % IV SOLN: 2.0000 g | Freq: Once | INTRAVENOUS | Status: DC

## 2020-05-13 MED ORDER — MIRTAZAPINE 15 MG PO TABS
7.5000 mg | ORAL_TABLET | Freq: Every day | ORAL | Status: DC
  Administered 2020-05-13 – 2020-05-17 (×5): 7.5 mg via ORAL
  Filled 2020-05-13 (×5): qty 1

## 2020-05-13 MED ORDER — VANCOMYCIN HCL 750 MG/150ML IV SOLN
750.0000 mg | INTRAVENOUS | Status: DC
  Administered 2020-05-14 – 2020-05-18 (×5): 750 mg via INTRAVENOUS
  Filled 2020-05-13 (×9): qty 150

## 2020-05-13 MED ORDER — SODIUM CHLORIDE 0.9 % IV SOLN: INTRAVENOUS | Status: DC

## 2020-05-13 MED ORDER — ENSURE ENLIVE PO LIQD
237.0000 mL | Freq: Two times a day (BID) | ORAL | Status: DC
  Administered 2020-05-14 – 2020-05-18 (×10): 237 mL via ORAL
  Filled 2020-05-13 (×6): qty 237

## 2020-05-13 MED ORDER — ONDANSETRON HCL 4 MG PO TABS: 4.0000 mg | ORAL_TABLET | Freq: Four times a day (QID) | ORAL | Status: DC | PRN

## 2020-05-13 MED ORDER — ACETAMINOPHEN 650 MG RE SUPP: 650.0000 mg | Freq: Four times a day (QID) | RECTAL | Status: DC | PRN

## 2020-05-13 MED ORDER — ONDANSETRON HCL 4 MG/2ML IJ SOLN: 4.0000 mg | Freq: Four times a day (QID) | INTRAMUSCULAR | Status: DC | PRN

## 2020-05-13 MED ORDER — PIPERACILLIN-TAZOBACTAM 3.375 G IVPB 30 MIN: 3.3750 g | Freq: Once | INTRAVENOUS | Status: DC

## 2020-05-13 NOTE — Progress Notes (Signed)
Secure chat with bedside staff Michiko RN. Pt has been a very difficult stick for labs, and has not had access availability due to contractures. Bedside RN confirmed that she will get the antibiotics given and repeat LA drawn as soon as possible.

## 2020-05-13 NOTE — ED Notes (Signed)
2 RN's attempted 2 more IV sticks per policy without success.

## 2020-05-13 NOTE — Progress Notes (Addendum)
Pharmacy Antibiotic Note  Tracy Armstrong is a 74 y.o. female admitted on 05/13/2020 with  multiple non-healing pressure wounds.  Pharmacy has been consulted to dose vancomycin for cellulitis.  Plan:  give Zosyn 3.375g IV x1 dose now over 30 min, then 3.375g IV q8h (4-hr infusion) Give vancomycin 1.5g  IV x1 dose now, then vancomycin 1g IV q24h Further doses to be determined when BMP results Goal vancomycin trough range:  10-15  mcg/mL Pharmacy will continue to monitor renal function, vancomycin troughs as clinically appropriate,  cultures and patient progress.   Height: 5\' 6"  (167.6 cm) Weight: 63.5 kg (140 lb) IBW/kg (Calculated) : 59.3  Temp (24hrs), Avg:97.9 F (36.6 C), Min:97.9 F (36.6 C), Max:97.9 F (36.6 C)   No Known Allergies  Antimicrobials this admission: vancomycin 7/9 >>  ceftriaxone 7/9 >>7/9 Zosyn 7/9>>   clindamycin 7/9>>   Microbiology results: n/a 7/9  BC x2:    7/9  UCx:       Thank you for allowing pharmacy to be a part of this patient's care.  9/9 05/13/2020 10:41 AM

## 2020-05-13 NOTE — ED Provider Notes (Signed)
[facility] EMERGENCY DEPARTMENT Provider Note   CSN: [id number] Arrival date & time: 05/13/20  1031     History Chief Complaint  Patient presents with  . Failure To Thrive    Tracy Armstrong is a 74 y.o. female history of multiple sclerosis, hypertension, dementia, spinal stenosis, paraplegia, cholecystectomy.  Patient presents today with her full-time caregiver, daughter for concern of decreased intake and pressure ulcers.  Daughter reports that pressure ulcers have been present for multiple months but have continued to worsen despite her care.  Patient is contracted and is only able to lay in a single position.  Ulcers present to the left foot/ankle, right foot, right knee, bilateral glutes.  Worst ulcer is of the left foot, patient reports that the foot is swollen and draining and several weeks ago the tip of the toe fell off.  Over the past 2 weeks patient has had decreased intake as well only eating small amount of Ensure every day, they spoke with the primary care doctor about this and she was prescribed mirtazapine but this has not helped.  Patient reports that she is only in pain when she moves and has no pain when laying still.  She describes pain diffusely of her lower extremities as occasional aching nonradiating worse with movement improved with rest.  Level 5 caveat dementia.  HPI     Past Medical History:  Diagnosis Date  . Multiple sclerosis (HCC) 2009    Patient Active Problem List   Diagnosis Date Noted  . Sepsis (HCC) 05/13/2020    Past Surgical History:  Procedure Laterality Date  . CHOLECYSTECTOMY       OB History   No obstetric history on file.     No family history on file.  Social History   Tobacco Use  . Smoking status: Never Smoker  . Smokeless tobacco: Never Used  Substance Use Topics  . Alcohol use: No  . Drug use: No    Home Medications Prior to Admission medications   Medication Sig Start Date End Date Taking? Authorizing Provider    bisoprolol-hydrochlorothiazide (ZIAC) 5-6.25 MG tablet Take 1 tablet by mouth daily. 03/25/20  Yes [provider]  donepezil (ARICEPT) 10 MG tablet Take 10 mg by mouth at bedtime.  12/28/19  Yes [provider]  furosemide (LASIX) 20 MG tablet Take 20 mg by mouth daily. 03/25/20  Yes [provider]  mirtazapine (REMERON) 7.5 MG tablet Take 7.5 mg by mouth at bedtime. 05/02/20  Yes [provider]  HYDROcodone-acetaminophen (NORCO) 5-325 MG per tablet Take 1 tablet by mouth every 6 (six) hours as needed. For pain  Patient not taking: Reported on 05/13/2020    [provider]  HYDROcodone-acetaminophen (VICODIN) 5-500 MG per tablet Take 1 tablet by mouth every 6 (six) hours as needed. For pain  Patient not taking: Reported on 05/13/2020    [provider]    Allergies    Patient has no known allergies.  Review of Systems   Review of Systems  Unable to perform ROS: Dementia    Physical Exam Updated Vital Signs BP 91/69 (BP Location: Right Arm)   Pulse (!) 54   Temp 97.6 F (36.4 C) (Rectal)   Resp 19   Ht 5\' 6"  (1.676 m)   Wt 63.5 kg   SpO2 91%   BMI 22.60 kg/m   Physical Exam Constitutional:      General: She is awake. She is not in acute distress.    Appearance:  She is underweight. She is ill-appearing. She is not toxic-appearing or diaphoretic.  HENT:     Head: Normocephalic and atraumatic.     Right Ear: External ear normal.     Left Ear: External ear normal.     Nose: Nose normal.     Mouth/Throat:     Mouth: Mucous membranes are moist.     Pharynx: Oropharynx is clear.  Eyes:     General: Vision grossly intact. Gaze aligned appropriately.     Extraocular Movements: Extraocular movements intact.  Neck:     Trachea: Trachea and phonation normal.  Cardiovascular:     Rate and Rhythm: Regular rhythm. Tachycardia present.  Pulmonary:     Effort: Pulmonary effort is normal. No accessory muscle usage or respiratory  distress.     Breath sounds: Normal breath sounds and air entry.  Chest:     Chest wall: No deformity or tenderness.  Abdominal:     Palpations: Abdomen is soft.     Tenderness: There is no abdominal tenderness. There is no guarding or rebound.  Musculoskeletal:     Cervical back: Normal range of motion and neck supple. No spinous process tenderness or muscular tenderness.     Comments: Contractures x4.  Skin:    General: Skin is warm and dry.     Findings: Wound present.     Comments: Left foot with multiple large purulent draining wounds, diffusely swollen.  Right ankle large purulent draining wound of the medial right ankle.  Right knee with smaller draining wound.  Left buttocks with small draining wound superiorly.  Right buttocks with small draining wound superiorly.  Neurological:     Mental Status: She is alert.     Comments: Alert to self place and events.  Disoriented to time, reports here as 1944.  Psychiatric:        Behavior: Behavior is cooperative.     ED Results / Procedures / Treatments   Labs (all labs ordered are listed, but only abnormal results are displayed) Labs Reviewed  LACTIC ACID, PLASMA - Abnormal; Notable for the following components:      Result Value   Lactic Acid, Venous 2.8 (*)    All other components within normal limits  COMPREHENSIVE METABOLIC PANEL - Abnormal; Notable for the following components:   CO2 21 (*)    Glucose, Bld 122 (*)    Calcium 8.3 (*)    Albumin 2.0 (*)    AST 13 (*)    Alkaline Phosphatase 135 (*)    All other components within normal limits  CBC WITH DIFFERENTIAL/PLATELET - Abnormal; Notable for the following components:   WBC 22.0 (*)    RBC 3.38 (*)    Hemoglobin 8.4 (*)    HCT 29.9 (*)    MCH 24.9 (*)    MCHC 28.1 (*)    RDW 17.5 (*)    Neutro Abs 19.1 (*)    Abs Immature Granulocytes 0.27 (*)    All other components within normal limits  PROTIME-INR - Abnormal; Notable for the following components:     Prothrombin Time 15.7 (*)    INR 1.3 (*)    All other components within normal limits  CULTURE, BLOOD (ROUTINE X 2)  SARS CORONAVIRUS 2 BY RT PCR (HOSPITAL ORDER, PERFORMED IN [facility])  CULTURE, BLOOD (ROUTINE X 2)  URINE CULTURE  APTT  LACTIC ACID, PLASMA  URINALYSIS, ROUTINE W REFLEX MICROSCOPIC    EKG EKG Interpretation  Date/Time:  Friday May 13 2020 10:51:05 EDT Ventricular Rate:  113 PR Interval:    QRS Duration: 56 QT Interval:  367 QTC Calculation: 504 R Axis:   47 Text Interpretation: Sinus tachycardia Nonspecific T abnrm, anterolateral leads Borderline ST elevation Prolonged QT interval Artifact in lead(s) I II III aVR aVL aVF V1 V4 V5 V6 and baseline wander in lead(s) V2 Confirmed by Raeford Razor [phone]) on 05/13/2020 11:48:41 AM   Radiology DG Ankle 2 Views Right  Result Date: 05/13/2020 CLINICAL DATA:  Pressure wound. EXAM: RIGHT ANKLE - 2 VIEW COMPARISON:  No recent prior. FINDINGS: Large soft tissue wound noted over the medial aspect of the right ankle. No radiopaque foreign body. No underlying bony erosion. No evidence of fracture or dislocation. Diffuse osteopenia and degenerative change. IMPRESSION: 1. Large soft tissue wound noted over the medial aspect of the right ankle. No radiopaque foreign body. No underlying bony erosion. 2. No acute bony abnormality. Diffuse osteopenia and degenerative change. Electronically Signed   By: Maisie Fus  Register   On: 05/13/2020 12:33   DG Chest Port 1 View  Result Date: 05/13/2020 CLINICAL DATA:  Sepsis EXAM: PORTABLE CHEST 1 VIEW COMPARISON:  Portable exam 1041 hours without priors for comparison FINDINGS: Normal heart size, mediastinal contours, and pulmonary vascularity. Lungs clear. No pulmonary infiltrate, pleural effusion or pneumothorax. Bones demineralized. IMPRESSION: No acute abnormalities. Electronically Signed   By: Ulyses Southward M.D.   On: 05/13/2020 11:05   DG Foot Complete Left  Result Date:  05/13/2020 CLINICAL DATA:  Multiple nonhealing pressure ulcers, sepsis EXAM: LEFT FOOT - COMPLETE 4 VIEW COMPARISON:  None FINDINGS: Nonstandard positioning due to patient condition. Marked osseous demineralization. Diffuse soft tissue swelling LEFT foot with foci of soft tissue gas at the plantar aspect of the foot at the level of the fourth and fifth metatarsals. This could be due to open wound or infection by a gas-forming organism. 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 Advanced degenerative changes at the talonavicular joint with superior navicular subluxation, could represent inflammatory arthropathy or septic arthritis. Probable ankle joint effusion. Additional degenerative changes at the naviculocuneiform joint and at the calcaneocuboid joint. No acute fracture, dislocation, or additional bone destruction. Lucency projecting over ankle joint on lateral view likely represents an additional ulcer though this is less well localized due to nonstandard positioning. IMPRESSION: Soft tissue gas at plantar aspect of lateral LEFT foot at the level of the fourth and fifth metatarsals either due to open wound or infection by a gas-forming organism. Osseous demineralization with scattered degenerative changes as well as destructive changes and subluxation at the talonavicular joint, could represent inflammatory arthropathy but is concerning for septic arthritis. Suspected ankle joint effusion. Findings called to Harlene Salts PA on 05/13/2020 at 1110 hours. Electronically Signed   By: Ulyses Southward M.D.   On: 05/13/2020 11:12    Procedures .Critical Care Performed by: Bill Salinas, PA-C Authorized by: Bill Salinas, PA-C   Critical care provider statement:    Critical care time (minutes):  45   Critical care was necessary to treat or prevent imminent or life-threatening deterioration of the following conditions:  Sepsis   Critical care was time spent personally by me  on the following activities:  Discussions with consultants, evaluation of patient's response to treatment, examination of patient, ordering and performing treatments and interventions, ordering and review of laboratory studies, ordering and review of radiographic studies, pulse oximetry, re-evaluation of patient's condition, obtaining history from patient or  surrogate, review of old charts and development of treatment plan with patient or surrogate  Ultrasound ED Peripheral IV (Provider)  Date/Time: 05/13/2020 3:41 PM Performed by: Bill Salinas, PA-C Authorized by: Bill Salinas, PA-C   Procedure details:    Indications: multiple failed IV attempts and poor IV access   Comments:     Unable to locate adequate vein for peripheral IV.   (including critical care time)  Medications Ordered in ED Medications  vancomycin (VANCOREADY) IVPB 1500 mg/300 mL (has no administration in time range)  piperacillin-tazobactam (ZOSYN) IVPB 3.375 g (has no administration in time range)  clindamycin (CLEOCIN) IVPB 600 mg (has no administration in time range)  vancomycin (VANCOREADY) IVPB 750 mg/150 mL (has no administration in time range)  piperacillin-tazobactam (ZOSYN) IVPB 3.375 g (has no administration in time range)  sodium chloride 0.9 % bolus 1,000 mL (1,000 mLs Intravenous New Bag/Given 05/13/20 1233)    And  sodium chloride 0.9 % bolus 1,000 mL (1,000 mLs Intravenous New Bag/Given 05/13/20 1226)    ED Course  I have reviewed the triage vital signs and the nursing notes.  Pertinent labs & imaging results that were available during my care of the patient were reviewed by me and considered in my medical decision making (see chart for details).  Clinical Course as of May 13 1313  Fri May 13, 2020  1228 Dr. Jena Gauss; MRI, [facility]   [BM]    Clinical Course User Index [BM] Elizabeth Palau   MDM Rules/Calculators/A&P                         Additional History  Obtained: 1. Nursing notes from this visit. 2. Family, caregiver/daughter at bedside. 3. EMR reviewed, I am able to see patients primary care visits and diagnoses however unable to review actual notes. ---------------- 74 year old female with history as detailed above presents today for concern of decreased p.o. intake and multiple draining large bedsores.  She is in no acute distress but does appear ill and underweight.  Pulse 115 bpm, blood pressure soft with systolic around 90 mmHg.  Code sepsis activated on initial evaluation.  Patient without history of heart failure or dialysis full 30 mg/kg saline bolus ordered.  Started on empiric antibiotics for skin infection and concern for osteomyelitis of the left foot. --- I ordered, reviewed and interpreted labs which include: CBC shows leukocytosis of 22.0 with left shift, anemia of 8.4 no recent priors to compare. CMP shows no emergent electrolyte derangement, evidence of acute kidney injury or emergent elevation of LFTs or gap. Lactic 2.8 Covid test negative APTT within normal limits PT/INR elevated  DG left foot:  IMPRESSION:  Soft tissue gas at plantar aspect of lateral LEFT foot at the level  of the fourth and fifth metatarsals either due to open wound or  infection by a gas-forming organism.    Osseous demineralization with scattered degenerative changes as well  as destructive changes and subluxation at the talonavicular joint,  could represent inflammatory arthropathy but is concerning for  septic arthritis.    Suspected ankle joint effusion.  I personally reviewed patient's left foot x-ray and agree with radiologist interpretation above.  DG Right Ankle:  IMPRESSION:  1. Large soft tissue wound noted over the medial aspect of the right  ankle. No radiopaque foreign body. No underlying bony erosion.    2. No acute bony abnormality. Diffuse osteopenia and degenerative  change.  I  personally reviewed patient's right ankle  x-ray and agree with radiologist interpretation above.  DG Chest:  IMPRESSION:  No acute abnormalities.  I personally viewed patient's chest x-ray and agree with radiologist interpretation above.  EKG: Sinus tachycardia Nonspecific T abnrm, anterolateral leads Borderline ST elevation Prolonged QT interval Artifact in lead(s) I II III aVR aVL aVF V1 V4 V5 V6 and baseline wander in lead(s) V2 Confirmed by Raeford Razor [phone]) on 05/13/2020 11:48:41 AM  Discussed case with Dr. Juleen China antibiotics changed, Rocephin canceled added clindamycin and Zosyn.  MRI left foot with contrast ordered.  Plan for admission.  Consult placed to orthopedist.  Patient reassessed remains stable, baseline mental status.  Patient and daughter are both agreeable for admission. ----------- 12:28 PM: Spoke with Dr. Jena Gauss from orthopedic surgery, agrees with MRI of the left foot.  He has the patient be admitted to hospitalist service at [facility].  Discussed case with Dr. Gwenlyn Perking, patient has been accepted to hospitalist service. - 5:20 PM: Patient was reassessed multiple times during this visit remained stable.  Multiple attempts to establish additional access were unsuccessful.  Patient may need IR placement or PICC line when she arrives at [facility].  Note: Portions of this report may have been transcribed using voice recognition software. Every effort was made to ensure accuracy; however, inadvertent computerized transcription errors may still be present. Final Clinical Impression(s) / ED Diagnoses Final diagnoses:  Sepsis without acute organ dysfunction, due to unspecified organism [facility])    Rx / DC Orders ED Discharge Orders    None       Elizabeth Palau 05/13/20 1720    Raeford Razor, MD 05/14/20 1108

## 2020-05-13 NOTE — H&P (Signed)
History and Physical    Tracy Armstrong BUL:[id number] DOB: 03-20-1946 DOA: 05/13/2020  PCP: Neale Burly, MD   Patient coming from: home  I have personally briefly reviewed patient's old medical records in [place]  Chief Complaint: Decreased appetite, confusion and worsening left foot wound.  HPI: Tracy Armstrong is a 74 y.o. female with medical history significant of multiple sclerosis, dementia, hypertension and bedbound; who presented to emergency department secondary to worsening left foot infection, decrease appetite and worsening confusion. Patient's daughter reports over the last month mom has been actively declining, eating less and with worsening drainage and appearance in her chronic left foot wound. No fever, no chills, no nausea, no vomiting, no CP, no diarrhea, no hematuria, no dysuria, no melena or any other complaints.   *Patient met sepsis criteria on presentation with tachycardia, leukocytosis, lactic acidosis, worsening mentation (despite underlying hx of dementia) and source of infection left foot osteomyelitis.   ED Course: Blood cultures has been checked, patient with positive sepsis criteria in the setting of left foot osteomyelitis.  Broad-spectrum antibiotic has been started; orthopedic surgery consulted and recommendation to transfer to [facility] after MRI for further evaluation and management.  Patient may end up requiring to have amputation (left BKA).  TRH has been consulted to facilitate patient's admission, transferring and further care.  Review of Systems: As per HPI otherwise all other systems reviewed and are negative.   Past Medical History:  Diagnosis Date  . Multiple sclerosis ([place]) 2009    Past Surgical History:  Procedure Laterality Date  . CHOLECYSTECTOMY      Social History  reports that she has never smoked. She has never used smokeless tobacco. She reports that she does not drink alcohol and does not use drugs.  No Known  Allergies  Family history: -positive per HTN as per daughter reports. Family unable to participate due to dementia.  Prior to Admission medications   Medication Sig Start Date End Date Taking? Authorizing Provider  bisoprolol-hydrochlorothiazide (ZIAC) 5-6.25 MG tablet Take 1 tablet by mouth daily. 03/25/20  Yes [provider]  donepezil (ARICEPT) 10 MG tablet Take 10 mg by mouth at bedtime.  12/28/19  Yes [provider]  furosemide (LASIX) 20 MG tablet Take 20 mg by mouth daily. 03/25/20  Yes [provider]  mirtazapine (REMERON) 7.5 MG tablet Take 7.5 mg by mouth at bedtime. 05/02/20  Yes [provider]    Physical Exam: Vitals:   05/13/20 1001 05/13/20 1008 05/13/20 1058  BP:  91/69   Pulse:  (!) 54   Resp:  19   Temp:  97.9 F (36.6 C) 97.6 F (36.4 C)  TempSrc:  Oral Rectal  SpO2:  91%   Weight: 63.5 kg    Height: _0  (1.676 m)      Constitutional: NAD, no fever, underweight, no CP, no SOB. Chronically ill in appearance. Vitals:   05/13/20 1001 05/13/20 1008 05/13/20 1058  BP:  91/69   Pulse:  (!) 54   Resp:  19   Temp:  97.9 F (36.6 C) 97.6 F (36.4 C)  TempSrc:  Oral Rectal  SpO2:  91%   Weight: 63.5 kg    Height: _1  (1.676 m)     Eyes: PERRL, lids and conjunctivae normal, no icterus, no nystagmus. ENMT: Mucous membranes are dry. Posterior pharynx clear of any exudate or lesions. Neck: normal, supple, no masses, no thyromegaly, no JVD Respiratory: clear to auscultation bilaterally, no  wheezing, no crackles. Normal respiratory effort. No accessory muscle use.  Cardiovascular: No rubs, no gallops, positive sinus tachycardia; no murmurs appreciated on exam. Abdomen: no tenderness, no masses palpated. No hepatosplenomegaly. Bowel sounds positive.  Musculoskeletal: Positive joint deformities appreciated in her hands; patient with contractures of 4 limbs due to chronic multiple sclerosis changes.  Multiple pressure injuries  wounds appreciated on her legs and sacral area. Skin: Open unstageable left foot inner aspect with active purulent drainage, foul odor and surrounding swelling.erythematous changes. Positive unstageable calcaneous ulcer and also with plantar ulcer. Please see media for images. Patient right lateral stage 4 ulcer. Decubitus stage 3 pressure injury. Neurologic: CN 2-12 grossly intact. No new deficit appreciated.  Psychiatric: Impaired judgment and insight due to underlying dementia, oriented X2 (less interactive lately as per daughter reports), Normal mood.    Labs on Admission: I have personally reviewed following labs and imaging studies  CBC: Recent Labs  Lab 05/13/20 1145  WBC 22.0*  NEUTROABS 19.1*  HGB 8.4*  HCT 29.9*  MCV 88.5  PLT 696    Basic Metabolic Panel: Recent Labs  Lab 05/13/20 1145  NA 135  K 3.9  CL 103  CO2 21*  GLUCOSE 122*  BUN 20  CREATININE 0.55  CALCIUM 8.3*    GFR: Estimated Creatinine Clearance: 59.5 mL/min (by C-G formula based on SCr of 0.55 mg/dL).  Liver Function Tests: Recent Labs  Lab 05/13/20 1145  AST 13*  ALT 9  ALKPHOS 135*  BILITOT 0.5  PROT 6.9  ALBUMIN 2.0*    Urine analysis:    Component Value Date/Time   COLORURINE YELLOW 07/19/2011 1232   APPEARANCEUR TURBID (A) 07/19/2011 1232   LABSPEC 1.025 07/19/2011 1232   PHURINE 7.0 07/19/2011 1232   GLUCOSEU NEGATIVE 07/19/2011 1232   HGBUR MODERATE (A) 07/19/2011 1232   BILIRUBINUR NEGATIVE 07/19/2011 1232   KETONESUR 40 (A) 07/19/2011 1232   PROTEINUR NEGATIVE 07/19/2011 1232   UROBILINOGEN 0.2 07/19/2011 1232   NITRITE NEGATIVE 07/19/2011 1232   LEUKOCYTESUR MODERATE (A) 07/19/2011 1232    Radiological Exams on Admission: DG Ankle 2 Views Right  Result Date: 05/13/2020 CLINICAL DATA:  Pressure wound. EXAM: RIGHT ANKLE - 2 VIEW COMPARISON:  No recent prior. FINDINGS: Large soft tissue wound noted over the medial aspect of the right ankle. No radiopaque foreign body.  No underlying bony erosion. No evidence of fracture or dislocation. Diffuse osteopenia and degenerative change. IMPRESSION: 1. Large soft tissue wound noted over the medial aspect of the right ankle. No radiopaque foreign body. No underlying bony erosion. 2. No acute bony abnormality. Diffuse osteopenia and degenerative change. Electronically Signed   By: Marcello Moores  Register   On: 05/13/2020 12:33   MR FOOT LEFT W WO CONTRAST  Result Date: 05/13/2020 CLINICAL DATA:  Nonhealing pressure all ulcers of the left foot and ankle EXAM: MRI OF THE LEFT FOREFOOT WITHOUT AND WITH CONTRAST; MRI OF THE LEFT ANKLE WITHOUT AND WITH CONTRAST TECHNIQUE: Multiplanar, multisequence MR imaging of the left foot and ankle was performed both before and after administration of intravenous contrast. CONTRAST:  56m GADAVIST GADOBUTROL 1 MMOL/ML IV SOLN COMPARISON:  X-ray 05/13/2020 FINDINGS: Bones: There is a deep soft tissue ulceration along the medial aspect of the ankle with ulcer base closely approximating the medial cortex of the medial malleolus and talus. Associated bone marrow edema and enhancement within the peripheral aspect of the medial malleolus and medial talus with loss of cortical definition and low T1 bone marrow signal compatible  with acute osteomyelitis (series 5 and 6, image 10). Susceptibility within the tibiotalar joint space suggesting air tracking intra-articularly. Trace tibiotalar joint effusion with enhancing synovium concerning for septic arthritis. Trace talonavicular joint effusion with marrow edema and cortical irregularity along both sides of the joint (series 8 and 9, image 12). Findings are suspicious for septic arthritis and osteomyelitis. Similar findings are present at the calcaneocuboid joint with small joint effusion and associated subchondral marrow edema (series 9, image 17). Marrow edema within the residual great toe distal phalanx (series 9, image 10) raises suspicion for osteomyelitis. There is  evidence of previous resection of the distal tuft. Additional ulceration over the posterior calcaneus with marrow edema and low T1 signal along the peripheral margin of the posterior calcaneus suspicious for osteomyelitis (series 1 and 2, image 11). Tendons: The posterior tibialis and flexor digitorum longus tendons extend into the area of ulceration at the lateral ankle and are not well delineated, and may be torn. Flexor hallucis longus tendon intact. Tendinosis of the distal Achilles tendon with poor delineation of the distal insertion at site of likely pressure ulceration. Peroneal tendons appear intact. Ligaments: LisFranc ligament intact. Deltoid and spring ligament complex are poorly evaluated at site of ulceration, likely torn. Lateral ankle tendons grossly intact. Soft tissues: Deep soft tissue ulceration at the lateral ankle, as described above. Extensive surrounding soft tissue edema and enhancement compatible with cellulitis. No drainable fluid collection. Extensive pressure ulceration at the posterior hindfoot. There are extensive foci of susceptibility tracking within the soft tissues of the lateral forefoot (series 8, images 22-23). Additional areas of more superficial ulceration along the plantar aspect of the midfoot and forefoot with surrounding cellulitis. IMPRESSION: 1. Deep soft tissue ulceration along the medial aspect of the ankle with ulcer base extending to the medial cortex of the medial malleolus and talus. Associated signal changes within the peripheral aspects of the medial malleolus and medial talus compatible with acute osteomyelitis. 2. Trace tibiotalar joint effusion with enhancing synovium concerning for septic arthritis. There appears to be air or gas within the tibiotalar joint, possibly extending from adjacent ulceration. 3. Trace talonavicular joint effusion with marrow edema and cortical irregularity along both sides of the joint suspicious for septic arthritis and  osteomyelitis. 4. Similar findings at the calcaneocuboid joint also suspicious for septic arthritis and osteomyelitis. 5. Additional ulceration over the at the posterior hindfoot with acute osteomyelitis of the posterior calcaneus. 6. Marrow edema within the residual great toe distal phalanx which may reflect osteitis versus early osteomyelitis. 7. Extensive cellulitis with numerous foci of susceptibility tracking within the soft tissues of the lateral forefoot concerning for gas-forming infection. No drainable fluid collection. 8. Tendinous and ligamentous abnormalities, as above. These results will be called to the ordering clinician or representative by the Radiologist Assistant, and communication documented in the PACS or [employer]. Electronically Signed   By: Davina Poke D.O.   On: 05/13/2020 15:56   MR ANKLE LEFT W WO CONTRAST  Result Date: 05/13/2020 CLINICAL DATA:  Nonhealing pressure all ulcers of the left foot and ankle EXAM: MRI OF THE LEFT FOREFOOT WITHOUT AND WITH CONTRAST; MRI OF THE LEFT ANKLE WITHOUT AND WITH CONTRAST TECHNIQUE: Multiplanar, multisequence MR imaging of the left foot and ankle was performed both before and after administration of intravenous contrast. CONTRAST:  32m GADAVIST GADOBUTROL 1 MMOL/ML IV SOLN COMPARISON:  X-ray 05/13/2020 FINDINGS: Bones: There is a deep soft tissue ulceration along the medial aspect of the ankle with ulcer base  closely approximating the medial cortex of the medial malleolus and talus. Associated bone marrow edema and enhancement within the peripheral aspect of the medial malleolus and medial talus with loss of cortical definition and low T1 bone marrow signal compatible with acute osteomyelitis (series 5 and 6, image 10). Susceptibility within the tibiotalar joint space suggesting air tracking intra-articularly. Trace tibiotalar joint effusion with enhancing synovium concerning for septic arthritis. Trace talonavicular joint effusion with  marrow edema and cortical irregularity along both sides of the joint (series 8 and 9, image 12). Findings are suspicious for septic arthritis and osteomyelitis. Similar findings are present at the calcaneocuboid joint with small joint effusion and associated subchondral marrow edema (series 9, image 17). Marrow edema within the residual great toe distal phalanx (series 9, image 10) raises suspicion for osteomyelitis. There is evidence of previous resection of the distal tuft. Additional ulceration over the posterior calcaneus with marrow edema and low T1 signal along the peripheral margin of the posterior calcaneus suspicious for osteomyelitis (series 1 and 2, image 11). Tendons: The posterior tibialis and flexor digitorum longus tendons extend into the area of ulceration at the lateral ankle and are not well delineated, and may be torn. Flexor hallucis longus tendon intact. Tendinosis of the distal Achilles tendon with poor delineation of the distal insertion at site of likely pressure ulceration. Peroneal tendons appear intact. Ligaments: LisFranc ligament intact. Deltoid and spring ligament complex are poorly evaluated at site of ulceration, likely torn. Lateral ankle tendons grossly intact. Soft tissues: Deep soft tissue ulceration at the lateral ankle, as described above. Extensive surrounding soft tissue edema and enhancement compatible with cellulitis. No drainable fluid collection. Extensive pressure ulceration at the posterior hindfoot. There are extensive foci of susceptibility tracking within the soft tissues of the lateral forefoot (series 8, images 22-23). Additional areas of more superficial ulceration along the plantar aspect of the midfoot and forefoot with surrounding cellulitis. IMPRESSION: 1. Deep soft tissue ulceration along the medial aspect of the ankle with ulcer base extending to the medial cortex of the medial malleolus and talus. Associated signal changes within the peripheral aspects of  the medial malleolus and medial talus compatible with acute osteomyelitis. 2. Trace tibiotalar joint effusion with enhancing synovium concerning for septic arthritis. There appears to be air or gas within the tibiotalar joint, possibly extending from adjacent ulceration. 3. Trace talonavicular joint effusion with marrow edema and cortical irregularity along both sides of the joint suspicious for septic arthritis and osteomyelitis. 4. Similar findings at the calcaneocuboid joint also suspicious for septic arthritis and osteomyelitis. 5. Additional ulceration over the at the posterior hindfoot with acute osteomyelitis of the posterior calcaneus. 6. Marrow edema within the residual great toe distal phalanx which may reflect osteitis versus early osteomyelitis. 7. Extensive cellulitis with numerous foci of susceptibility tracking within the soft tissues of the lateral forefoot concerning for gas-forming infection. No drainable fluid collection. 8. Tendinous and ligamentous abnormalities, as above. These results will be called to the ordering clinician or representative by the Radiologist Assistant, and communication documented in the PACS or [employer]. Electronically Signed   By: Davina Poke D.O.   On: 05/13/2020 15:56   DG Chest Port 1 View  Result Date: 05/13/2020 CLINICAL DATA:  Sepsis EXAM: PORTABLE CHEST 1 VIEW COMPARISON:  Portable exam 1041 hours without priors for comparison FINDINGS: Normal heart size, mediastinal contours, and pulmonary vascularity. Lungs clear. No pulmonary infiltrate, pleural effusion or pneumothorax. Bones demineralized. IMPRESSION: No acute abnormalities. Electronically Signed  By: Lavonia Dana M.D.   On: 05/13/2020 11:05   DG Foot Complete Left  Result Date: 05/13/2020 CLINICAL DATA:  Multiple nonhealing pressure ulcers, sepsis EXAM: LEFT FOOT - COMPLETE 4 VIEW COMPARISON:  None FINDINGS: Nonstandard positioning due to patient condition. Marked osseous demineralization.  Diffuse soft tissue swelling LEFT foot with foci of soft tissue gas at the plantar aspect of the foot at the level of the fourth and fifth metatarsals. This could be due to open wound or infection by a gas-forming organism. 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 Advanced degenerative changes at the talonavicular joint with superior navicular subluxation, could represent inflammatory arthropathy or septic arthritis. Probable ankle joint effusion. Additional degenerative changes at the naviculocuneiform joint and at the calcaneocuboid joint. No acute fracture, dislocation, or additional bone destruction. Lucency projecting over ankle joint on lateral view likely represents an additional ulcer though this is less well localized due to nonstandard positioning. IMPRESSION: Soft tissue gas at plantar aspect of lateral LEFT foot at the level of the fourth and fifth metatarsals either due to open wound or infection by a gas-forming organism. Osseous demineralization with scattered degenerative changes as well as destructive changes and subluxation at the talonavicular joint, could represent inflammatory arthropathy but is concerning for septic arthritis. Suspected ankle joint effusion. Findings called to Nuala Alpha PA on 05/13/2020 at 1110 hours. Electronically Signed   By: Lavonia Dana M.D.   On: 05/13/2020 11:12    EKG: Independently reviewed. Sinus tachycardia, no acute ischemic changes.  Assessment/Plan 1-sepsis ([place]): Due to left fluid osteomyelitis/cellulitis. -Patient has met sepsis criteria on admission with elevated WBCs, elevated heart rate, positive lactic acid, source of infection left foot with active ongoing cellulitic/osteomyelitis and open wound. -Blood pressure soft but with good MAP -Sepsis protocol initiated in the ED with fluid resuscitation, blood cultures, urine culture, broad-spectrum antibiotics. -Orthopedic surgery has been contacted who has recommended  MRI evaluation and transferred to [facility] for further management -MRI has confirmed positive osteomyelitis as suggested left ankle septic arthritis. -Patient will On IV fluids, IV antibiotics, as needed antipyretics and supportive care. -Overall prognosis is guarded.  2-Multiple sclerosis ([place]) -Very advanced -Patient bedbound currently -Severe 4 limbs contractures appreciated. -Continue supportive care.  3-Unstageable pressure injury of skin and tissue (HCC) -Multiple pressure injuries wounds appreciated in her sacral/buttocks area and also on both lower extremities. -Please refer to media section in epic for images of these wounds -Wound care has been consulted to provide wound care management and preventive measurements -Orthopedic to further evaluate the biggest and nastier wound of all (located in her  left foot), with active purulent drainage, swelling, surrounding erythema.motted appearance and foul smelling.   4-Dementia without behavioral disturbance (HCC) -continue aricept and supportive care -continue constant reorientation   5-HTN (hypertension) -BP is currently soft and with imminent sepsis -will hold all antihypertensive agents -continue IVF's  6-Lactic acidosis -continue IVF's and follow trend   7-depression/anorexia/severe protein calorie malnutrition  -started on ensure -dietitian consulted -continue low dose Remeron qhs.  8-code status discussion -palliative care consulted -long discussion with daughter about guarded prognosis and ongoing decrease quality of life. -so far will like to pursuit full care and invasive interventions.   DVT prophylaxis: Heparin Code Status:   Full code Family Communication:  Daughter at bedside Disposition Plan:   Patient is from:  Home.  Anticipated DC to:  To be determined  Anticipated DC date:  To be determined  Anticipated DC  barriers: stabilization of ongoing osteomyelitic process and sepsis.  Consults  called:  Orthopedic service (Dr. Doreatha Martin) contacted by EDP; Palliative care. Admission status:  Progressive unit to [facility]; inpatient status, length of stay more than 2 midnights.  Severity of Illness: Severe illness; left foot osteomyelitis and cellulitis process; sepsis on presentation.  Require hospitalization for acute care of what appears to be already an ongoing life-threatening process.  Patient will be transferred to [facility] for orthopedic evaluation as she may very well in the requiring amputation.  Started on IV antibiotics, IV fluids per sepsis protocol and will follow clinical response.    Barton Dubois MD [facility]  How to contact the [facility] Attending or Consulting provider Rafael Capo or covering provider during after hours [place], for this patient?   1. Check the care team in [facility] and look for a) attending/consulting [facility] provider listed and b) the [facility] team listed 2. Log into [email or web address] and use 's universal password to access. If you do not have the password, please contact the hospital operator. 3. Locate the [facility] provider you are looking for under [facility] and page to a number that you can be directly reached. 4. If you still have difficulty reaching the provider, please page the [facility] (Director on Call) for the Hospitalists listed on amion for assistance.  05/13/2020, 5:52 PM

## 2020-05-13 NOTE — ED Provider Notes (Signed)
Medical screening examination/treatment/procedure(s) were conducted as a shared visit with non-physician practitioner(s) and myself.  I personally evaluated the patient during the encounter.  EKG Interpretation  Date/Time:  Friday May 13 2020 10:51:05 EDT Ventricular Rate:  113 PR Interval:    QRS Duration: 56 QT Interval:  367 QTC Calculation: 504 R Axis:   47 Text Interpretation: Sinus tachycardia Nonspecific T abnrm, anterolateral leads Borderline ST elevation Prolonged QT interval Artifact in lead(s) I II III aVR aVL aVF V1 V4 V5 V6 and baseline wander in lead(s) V2 Confirmed by Raeford Razor [phone]) on 05/13/2020 11:48:41 AM  74yF with decreased oral intake and pressure ulcers. L foot wounds with significant deep infection. With baseline contractures, poor nutritional status and extent of infection I suspect she will need amputation. IV abx for now. Orthopedic consultation. Hospitalist admission. Pt with poor access. Pt has significant contractures making line placement difficult, even central access. Couldn't position to safely attempt IJ or L femoral line. With assistance retracting her L leg I could access R femoral but couldn't pass the guidewire. She needs additional access but not emergent need.    Raeford Razor, MD 05/14/20 [phone]

## 2020-05-13 NOTE — Progress Notes (Signed)
Patient with contractures in both upper extremities with limited movement in arms. Left arm has more ROM than right. Limited venous access due to poor vasculature and limited arm access. Patient is able to move left arm out from body slightly but not enough to place IV or PICC in LUA basilic or brachial veins. Able to place a 24 gauge IV in right posterior forearm. MD notified of findings and that central line would be best for patient if more access is needed.

## 2020-05-13 NOTE — ED Triage Notes (Signed)
Patient brought in by her daughter who reports this patient has declined with her appetite and the ongoing multiple unhealing pressure ulcers.

## 2020-05-14 DIAGNOSIS — Z515 Encounter for palliative care: Secondary | ICD-10-CM

## 2020-05-14 DIAGNOSIS — Z7189 Other specified counseling: Secondary | ICD-10-CM

## 2020-05-14 LAB — FOLATE: Folate: 13.3 ng/mL (ref >5.9)

## 2020-05-14 LAB — FERRITIN: Ferritin: 921 ng/mL — ABNORMAL HIGH (ref 11–307)

## 2020-05-14 LAB — BASIC METABOLIC PANEL
Anion gap: 8 (ref 5–15)
BUN: 16 mg/dL (ref 8–23)
CO2: 23 mmol/L (ref 22–32)
Calcium: 8.1 mg/dL — ABNORMAL LOW (ref 8.9–10.3)
Chloride: 107 mmol/L (ref 98–111)
Creatinine, Ser: 0.55 mg/dL (ref 0.44–1.00)
GFR calc Af Amer: >60 mL/min (ref >60)
GFR calc non Af Amer: >60 mL/min (ref >60)
Glucose, Bld: 81 mg/dL (ref 70–99)
Potassium: 3.5 mmol/L (ref 3.5–5.1)
Sodium: 138 mmol/L (ref 135–145)

## 2020-05-14 LAB — ABO/RH: ABO/RH(D): B POS

## 2020-05-14 LAB — CBC
HCT: 24.1 % — ABNORMAL LOW (ref 36.0–46.0)
Hemoglobin: 7.2 g/dL — ABNORMAL LOW (ref 12.0–15.0)
MCH: 24.2 pg — ABNORMAL LOW (ref 26.0–34.0)
MCHC: 29.9 g/dL — ABNORMAL LOW (ref 30.0–36.0)
MCV: 81.1 fL (ref 80.0–100.0)
Platelets: 173 10*3/uL (ref 150–400)
RBC: 2.97 MIL/uL — ABNORMAL LOW (ref 3.87–5.11)
RDW: 17.4 % — ABNORMAL HIGH (ref 11.5–15.5)
WBC: 18.2 10*3/uL — ABNORMAL HIGH (ref 4.0–10.5)
nRBC: 0 % (ref 0.0–0.2)

## 2020-05-14 LAB — RETICULOCYTES
Immature Retic Fract: 32 % — ABNORMAL HIGH (ref 2.3–15.9)
RBC.: 2.65 MIL/uL — ABNORMAL LOW (ref 3.87–5.11)
Retic Count, Absolute: 35 10*3/uL (ref 19.0–186.0)
Retic Ct Pct: 1.3 % (ref 0.4–3.1)

## 2020-05-14 LAB — BRAIN NATRIURETIC PEPTIDE: B Natriuretic Peptide: 812.7 pg/mL — ABNORMAL HIGH (ref 0.0–100.0)

## 2020-05-14 LAB — VITAMIN B12: Vitamin B-12: 633 pg/mL (ref 180–914)

## 2020-05-14 LAB — PROCALCITONIN: Procalcitonin: 0.29 ng/mL

## 2020-05-14 LAB — IRON AND TIBC
Iron: 13 ug/dL — ABNORMAL LOW (ref 28–170)
Saturation Ratios: 13 % (ref 10.4–31.8)
TIBC: 102 ug/dL — ABNORMAL LOW (ref 250–450)
UIBC: 89 ug/dL

## 2020-05-14 LAB — C-REACTIVE PROTEIN: CRP: 20.9 mg/dL — ABNORMAL HIGH (ref <1.0)

## 2020-05-14 LAB — MAGNESIUM: Magnesium: 1.8 mg/dL (ref 1.7–2.4)

## 2020-05-14 LAB — HIV ANTIBODY (ROUTINE TESTING W REFLEX): HIV Screen 4th Generation wRfx: NONREACTIVE

## 2020-05-14 MED ORDER — FERROUS SULFATE 325 (65 FE) MG PO TABS
325.0000 mg | ORAL_TABLET | Freq: Two times a day (BID) | ORAL | Status: DC
  Administered 2020-05-14 – 2020-05-18 (×9): 325 mg via ORAL
  Filled 2020-05-14 (×9): qty 1

## 2020-05-14 MED ORDER — DOCUSATE SODIUM 100 MG PO CAPS
200.0000 mg | ORAL_CAPSULE | Freq: Two times a day (BID) | ORAL | Status: DC
  Administered 2020-05-14 – 2020-05-18 (×9): 200 mg via ORAL
  Filled 2020-05-14 (×8): qty 2

## 2020-05-14 MED ORDER — PANTOPRAZOLE SODIUM 40 MG PO TBEC
40.0000 mg | DELAYED_RELEASE_TABLET | Freq: Every day | ORAL | Status: DC
  Administered 2020-05-14 – 2020-05-18 (×5): 40 mg via ORAL
  Filled 2020-05-14 (×5): qty 1

## 2020-05-14 MED ORDER — LACTATED RINGERS IV SOLN: INTRAVENOUS | Status: DC

## 2020-05-14 MED ORDER — PROSOURCE PLUS PO LIQD
30.0000 mL | Freq: Every day | ORAL | Status: DC
  Administered 2020-05-14 – 2020-05-18 (×5): 30 mL via ORAL
  Filled 2020-05-14 (×5): qty 30

## 2020-05-14 NOTE — Progress Notes (Addendum)
PROGRESS NOTE                                                                                                                                                                                                             Patient Demographics:    Tracy Armstrong, is a 74 y.o. female, DOB - November 23, 1945, ZOX:[id number]  Admit date - 05/13/2020   Admitting Physician Vassie Loll, MD  Outpatient Primary MD for the patient is [name], MD  LOS - 1  Chief Complaint  Patient presents with  . Failure To Thrive       Brief Narrative -  Tracy Armstrong is a 74 y.o. female with medical history significant of multiple sclerosis, dementia, hypertension and bedbound; who presented to [facility] emergency department secondary to worsening left foot infection, decrease appetite and worsening confusion. Patient's daughter reports over the last month mom has been actively declining, eating less and with worsening drainage and appearance in her chronic left foot wound, other work-up showed possible osteomyelitis in the left foot and she was sent to [facility] for further care.   Subjective:    Tracy Armstrong today has, No headache, No chest pain, No abdominal pain - No Nausea, No new weakness tingling or numbness, No Cough - SOB.    Assessment  & Plan :      1. Sepsis due to left foot cellulitis along with osteomyelitis chronic. On empiric IV antibiotics, has been hydrated and sepsis pathophysiology has resolved, orthopedics is suggested wound care and conservative treatment. Continue antibiotics and follow cultures. Extremely poor candidate for any major surgical intervention, wound care will be involved.  Recent Labs  Lab 05/13/20 1145 05/13/20 1851 05/14/20 0756  WBC 22.0*  --  18.2*  PLT 192  --  173  CRP  --   --  20.9*  PROCALCITON  --   --  0.29  LATICACIDVEN 2.8* <0.3*  --      2. Chronic deconditioning, cachexia, multiple sclerosis. She is bedbound, severe contractures in all  4 extremities especially lower extremities, continue supportive care. Palliative care will be involved for goals of care.   3. Multiple unstageable decubitus ulcers and pressure wounds present on admission. Kindly see nursing note. Wound care consulted.  4. History of dementia. Continue Aricept and supportive care. At risk for delirium. Minimize narcotics and benzodiazepines.  5. Hypertension. Blood pressure currently soft gentle hydration hold blood pressure medications.  6. Severe PCM and deconditioning. Nutritionist consult, protein  supplementations, PT OT.  7. Severe microcytic anemia worse with heme dilution due to IV fluids. Anemia panel, oral iron supplementation, monitor, type screen. PPI for now, transfuse if hemoglobin reaches close to 7, outpatient age-appropriate work-up. Not a candidate for EGD or colonoscopy in my opinion.    Pressure Injury 05/13/20 Buttocks Medial;Bilateral Unstageable - Full thickness tissue loss in which the base of the injury is covered by slough (yellow, tan, gray, green or brown) and/or eschar (tan, brown or black) in the wound bed. (Active)  05/13/20 1030  Location: Buttocks  Location Orientation: Medial;Bilateral  Staging: Unstageable - Full thickness tissue loss in which the base of the injury is covered by slough (yellow, tan, gray, green or brown) and/or eschar (tan, brown or black) in the wound bed.  Wound Description (Comments):   Present on Admission: Yes     Pressure Injury 05/13/20 Ankle Right Unstageable - Full thickness tissue loss in which the base of the injury is covered by slough (yellow, tan, gray, green or brown) and/or eschar (tan, brown or black) in the wound bed. (Active)  05/13/20 1030  Location: Ankle  Location Orientation: Right  Staging: Unstageable - Full thickness tissue loss in which the base of the injury is covered by slough (yellow, tan, gray, green or brown) and/or eschar (tan, brown or black) in the wound bed.  Wound  Description (Comments):   Present on Admission: Yes     Pressure Injury 05/13/20 Ankle Left Unstageable - Full thickness tissue loss in which the base of the injury is covered by slough (yellow, tan, gray, green or brown) and/or eschar (tan, brown or black) in the wound bed. (Active)  05/13/20 1030  Location: Ankle  Location Orientation: Left  Staging: Unstageable - Full thickness tissue loss in which the base of the injury is covered by slough (yellow, tan, gray, green or brown) and/or eschar (tan, brown or black) in the wound bed.  Wound Description (Comments):   Present on Admission: Yes     Pressure Injury 05/13/20 Knee Right Unstageable - Full thickness tissue loss in which the base of the injury is covered by slough (yellow, tan, gray, green or brown) and/or eschar (tan, brown or black) in the wound bed. (Active)  05/13/20 1030  Location: Knee  Location Orientation: Right  Staging: Unstageable - Full thickness tissue loss in which the base of the injury is covered by slough (yellow, tan, gray, green or brown) and/or eschar (tan, brown or black) in the wound bed.  Wound Description (Comments):   Present on Admission:         Condition -   Guarded  Family Communication  : daughter [name] - [phone] will be called on 05/15/2020 at 6pm, detailed prognosis discussion, DNR, full Med Rx, no heroics.  Code Status : DNR now  Consults  :  Ortho, Pall Care  Procedures  :    MRI L.Foot -  1. Deep soft tissue ulceration along the medial aspect of the ankle with ulcer base extending to the medial cortex of the medial malleolus and talus. Associated signal changes within the peripheral aspects of the medial malleolus and medial talus compatible with acute osteomyelitis. 2. Trace tibiotalar joint effusion with enhancing synovium concerning for septic arthritis. There appears to be air or gas within the tibiotalar joint, possibly extending from adjacent ulceration. 3. Trace talonavicular  joint effusion with marrow edema and cortical irregularity along both sides of the joint suspicious for septic arthritis and osteomyelitis. 4. Similar findings  at the calcaneocuboid joint also suspicious for septic arthritis and osteomyelitis. 5. Additional ulceration over the at the posterior hindfoot with acute osteomyelitis of the posterior calcaneus. 6. Marrow edema within the residual great toe distal phalanx which may reflect osteitis versus early osteomyelitis. 7. Extensive cellulitis with numerous foci of susceptibility tracking within the soft tissues of the lateral forefoot concerning for gas-forming infection. No drainable fluid collection. 8. Tendinous and ligamentous abnormalities, as above  PUD Prophylaxis : PPI  Disposition Plan  :    Status is: Inpatient  Remains inpatient appropriate because:IV treatments appropriate due to intensity of illness or inability to take PO and Inpatient level of care appropriate due to severity of illness   Dispo: The patient is from: Home              Anticipated d/c is to: Home              Anticipated d/c date is: 3 days              Patient currently is not medically stable to d/c.   DVT Prophylaxis  :   Heparin  Lab Results  Component Value Date   PLT 173 05/14/2020    Diet :  Diet Order            DIET DYS 3 Room service appropriate? Yes; Fluid consistency: Thin  Diet effective now                  Inpatient Medications Scheduled Meds: . (feeding supplement) PROSource Plus  30 mL Oral Daily  . donepezil  10 mg Oral QHS  . feeding supplement (ENSURE ENLIVE)  237 mL Oral BID BM  . heparin  5,000 Units Subcutaneous Q8H  . mirtazapine  7.5 mg Oral QHS  . pantoprazole  40 mg Oral Daily   Continuous Infusions: . sodium chloride 75 mL/hr at 05/14/20 0424  . piperacillin-tazobactam (ZOSYN)  IV 3.375 g (05/14/20 1306)  . vancomycin     PRN Meds:.acetaminophen **OR** acetaminophen, ondansetron **OR** ondansetron (ZOFRAN)  IV  Antibiotics  :   Anti-infectives (From admission, onward)   Start     Dose/Rate Route Frequency Ordered Stop   05/14/20 1400  vancomycin (VANCOREADY) IVPB 750 mg/150 mL     Discontinue     750 mg 150 mL/hr over 60 Minutes Intravenous Every 24 hours 05/13/20 1234     05/13/20 2200  piperacillin-tazobactam (ZOSYN) IVPB 3.375 g     Discontinue     3.375 g 12.5 mL/hr over 240 Minutes Intravenous Every 8 hours 05/13/20 1236     05/13/20 1130  piperacillin-tazobactam (ZOSYN) IVPB 3.375 g  Status:  Discontinued        3.375 g 100 mL/hr over 30 Minutes Intravenous  Once 05/13/20 1115 05/13/20 1751   05/13/20 1130  clindamycin (CLEOCIN) IVPB 600 mg  Status:  Discontinued        600 mg 100 mL/hr over 30 Minutes Intravenous  Once 05/13/20 1123 05/13/20 1751   05/13/20 1100  vancomycin (VANCOREADY) IVPB 1500 mg/300 mL        1,500 mg 150 mL/hr over 120 Minutes Intravenous  Once 05/13/20 1041 05/13/20 1811   05/13/20 1045  vancomycin (VANCOCIN) IVPB 1000 mg/200 mL premix  Status:  Discontinued        1,000 mg 200 mL/hr over 60 Minutes Intravenous  Once 05/13/20 1034 05/13/20 1040   05/13/20 1045  cefTRIAXone (ROCEPHIN) 2 g in sodium chloride 0.9 % 100 mL  IVPB  Status:  Discontinued        2 g 200 mL/hr over 30 Minutes Intravenous  Once 05/13/20 1034 05/13/20 1113          Objective:   Vitals:   05/13/20 2329 05/14/20 0400 05/14/20 0734 05/14/20 1210  BP: 105/74 91/62    Pulse: (!) 107 100  94  Resp: 15 16    Temp: 98 F (36.7 C) 97.7 F (36.5 C) (!) 97.3 F (36.3 C) (!) 97.4 F (36.3 C)  TempSrc: Oral Axillary Oral Oral  SpO2: 100% 98%  100%  Weight:      Height:        SpO2: 100 %  Wt Readings from Last 3 Encounters:  05/13/20 63.2 kg  07/19/11 59.9 kg     Intake/Output Summary (Last 24 hours) at 05/14/2020 1409 Last data filed at 05/14/2020 0500 Gross per 24 hour  Intake 885.18 ml  Output --  Net 885.18 ml     Physical Exam  Awake Alert, No new F.N deficits,  extremely weak and frail, [place].AT,PERRAL Supple Neck,No JVD, No cervical lymphadenopathy appriciated.  Symmetrical Chest wall movement, Good air movement bilaterally, CTAB RRR,No Gallops,Rubs or new Murmurs, No Parasternal Heave +ve B.Sounds, Abd Soft, No tenderness, No organomegaly appriciated, No rebound - guarding or rigidity. No Cyanosis, severe chronic lower extremity contractures, mild contractures in upper extremity right hand more than left, both feet under dressing    Pressure Injury 05/13/20 Buttocks Medial;Bilateral Unstageable - Full thickness tissue loss in which the base of the injury is covered by slough (yellow, tan, gray, green or brown) and/or eschar (tan, brown or black) in the wound bed. (Active)  05/13/20 1030  Location: Buttocks  Location Orientation: Medial;Bilateral  Staging: Unstageable - Full thickness tissue loss in which the base of the injury is covered by slough (yellow, tan, gray, green or brown) and/or eschar (tan, brown or black) in the wound bed.  Wound Description (Comments):   Present on Admission: Yes     Pressure Injury 05/13/20 Ankle Right Unstageable - Full thickness tissue loss in which the base of the injury is covered by slough (yellow, tan, gray, green or brown) and/or eschar (tan, brown or black) in the wound bed. (Active)  05/13/20 1030  Location: Ankle  Location Orientation: Right  Staging: Unstageable - Full thickness tissue loss in which the base of the injury is covered by slough (yellow, tan, gray, green or brown) and/or eschar (tan, brown or black) in the wound bed.  Wound Description (Comments):   Present on Admission: Yes     Pressure Injury 05/13/20 Ankle Left Unstageable - Full thickness tissue loss in which the base of the injury is covered by slough (yellow, tan, gray, green or brown) and/or eschar (tan, brown or black) in the wound bed. (Active)  05/13/20 1030  Location: Ankle  Location Orientation: Left  Staging: Unstageable -  Full thickness tissue loss in which the base of the injury is covered by slough (yellow, tan, gray, green or brown) and/or eschar (tan, brown or black) in the wound bed.  Wound Description (Comments):   Present on Admission: Yes     Pressure Injury 05/13/20 Knee Right Unstageable - Full thickness tissue loss in which the base of the injury is covered by slough (yellow, tan, gray, green or brown) and/or eschar (tan, brown or black) in the wound bed. (Active)  05/13/20 1030  Location: Knee  Location Orientation: Right  Staging: Unstageable - Full thickness tissue  loss in which the base of the injury is covered by slough (yellow, tan, gray, green or brown) and/or eschar (tan, brown or black) in the wound bed.  Wound Description (Comments):   Present on Admission:      Data Review:    Recent Labs  Lab 05/13/20 1145 05/14/20 0756  WBC 22.0* 18.2*  HGB 8.4* 7.2*  HCT 29.9* 24.1*  PLT 192 173  MCV 88.5 81.1  MCH 24.9* 24.2*  MCHC 28.1* 29.9*  RDW 17.5* 17.4*  LYMPHSABS 1.6  --   MONOABS 1.0  --   EOSABS 0.0  --   BASOSABS 0.0  --     Recent Labs  Lab 05/13/20 1145 05/13/20 1849 05/14/20 0756  NA 135  --  138  K 3.9  --  3.5  CL 103  --  107  CO2 21*  --  23  GLUCOSE 122*  --  81  BUN 20  --  16  CREATININE 0.55  --  0.55  CALCIUM 8.3*  --  8.1*  AST 13*  --   --   ALT 9  --   --   ALKPHOS 135*  --   --   BILITOT 0.5  --   --   ALBUMIN 2.0*  --   --   MG  --  1.9 1.8  CRP  --   --  20.9*  PROCALCITON  --   --  0.29  INR 1.3*  --   --   TSH  --  0.803  --   BNP  --   --  812.7*    Recent Labs  Lab 05/13/20 1059 05/14/20 0756  CRP  --  20.9*  BNP  --  812.7*  PROCALCITON  --  0.29  SARSCOV2NAA NEGATIVE  --     ------------------------------------------------------------------------------------------------------------------ No results for input(s): CHOL, HDL, LDLCALC, TRIG, CHOLHDL, LDLDIRECT in the last 72 hours.  No results found for:  HGBA1C ------------------------------------------------------------------------------------------------------------------ Recent Labs    05/13/20 1849  TSH 0.803   ------------------------------------------------------------------------------------------------------------------ Recent Labs    05/13/20 1849  VITAMINB12 639    Coagulation profile Recent Labs  Lab 05/13/20 1145  INR 1.3*    No results for input(s): DDIMER in the last 72 hours.  Cardiac Enzymes No results for input(s): CKMB, TROPONINI, MYOGLOBIN in the last 168 hours.  Invalid input(s): CK ------------------------------------------------------------------------------------------------------------------    Component Value Date/Time   BNP 812.7 (H) 05/14/2020 0756    Micro Results Recent Results (from the past 240 hour(s))  SARS Coronavirus 2 by RT PCR (hospital order, performed in [facility] hospital lab) Nasopharyngeal Nasopharyngeal Swab     Status: None   Collection Time: 05/13/20 10:59 AM   Specimen: Nasopharyngeal Swab  Result Value Ref Range Status   SARS Coronavirus 2 NEGATIVE NEGATIVE Final    Comment: (NOTE) SARS-CoV-2 target nucleic acids are NOT DETECTED.  The SARS-CoV-2 RNA is generally detectable in upper and lower respiratory specimens during the acute phase of infection. The lowest concentration of SARS-CoV-2 viral copies this assay can detect is 250 copies / mL. A negative result does not preclude SARS-CoV-2 infection and should not be used as the sole basis for treatment or other patient management decisions.  A negative result may occur with improper specimen collection / handling, submission of specimen other than nasopharyngeal swab, presence of viral mutation(s) within the areas targeted by this assay, and inadequate number of viral copies (<250 copies / mL). A negative result must be  combined with clinical observations, patient history, and epidemiological information.  Fact  Sheet for Patients:   BoilerBrush.com.cy  Fact Sheet for Healthcare Providers: https://pope.com/  This test is not yet approved or  cleared by the [place] FDA and has been authorized for detection and/or diagnosis of SARS-CoV-2 by FDA under an Emergency Use Authorization (EUA).  This EUA will remain in effect (meaning this test can be used) for the duration of the COVID-19 declaration under Section 564(b)(1) of the Act, 21 U.S.C. section 360bbb-3(b)(1), unless the authorization is terminated or revoked sooner.  Performed at [facility], [address] [place], [place] [address]   Blood Culture (routine x 2)     Status: None (Preliminary result)   Collection Time: 05/13/20 11:45 AM   Specimen: BLOOD  Result Value Ref Range Status   Specimen Description BLOOD  Final   Special Requests NONE  Final   Culture   Final    NO GROWTH < 24 HOURS Performed at [facility], [address], [place], [place] [address]    Report Status PENDING  Incomplete  Blood Culture (routine x 2)     Status: None (Preliminary result)   Collection Time: 05/13/20 11:45 AM   Specimen: BLOOD  Result Value Ref Range Status   Specimen Description BLOOD RIGHT ANTECUBITAL  Final   Special Requests   Final    BOTTLES DRAWN AEROBIC AND ANAEROBIC Blood Culture adequate volume   Culture   Final    NO GROWTH < 24 HOURS Performed at [facility], [address] [place], [place] [address]    Report Status PENDING  Incomplete    Radiology Reports DG Ankle 2 Views Right  Result Date: 05/13/2020 CLINICAL DATA:  Pressure wound. EXAM: RIGHT ANKLE - 2 VIEW COMPARISON:  No recent prior. FINDINGS: Large soft tissue wound noted over the medial aspect of the right ankle. No radiopaque foreign body. No underlying bony erosion. No evidence of fracture or dislocation. Diffuse osteopenia and degenerative change. IMPRESSION: 1. Large soft tissue wound noted over the medial  aspect of the right ankle. No radiopaque foreign body. No underlying bony erosion. 2. No acute bony abnormality. Diffuse osteopenia and degenerative change. Electronically Signed   By: Maisie Fus  Register   On: 05/13/2020 12:33   MR FOOT LEFT W WO CONTRAST  Result Date: 05/13/2020 CLINICAL DATA:  Nonhealing pressure all ulcers of the left foot and ankle EXAM: MRI OF THE LEFT FOREFOOT WITHOUT AND WITH CONTRAST; MRI OF THE LEFT ANKLE WITHOUT AND WITH CONTRAST TECHNIQUE: Multiplanar, multisequence MR imaging of the left foot and ankle was performed both before and after administration of intravenous contrast. CONTRAST:  6mL GADAVIST GADOBUTROL 1 MMOL/ML IV SOLN COMPARISON:  X-ray 05/13/2020 FINDINGS: Bones: There is a deep soft tissue ulceration along the medial aspect of the ankle with ulcer base closely approximating the medial cortex of the medial malleolus and talus. Associated bone marrow edema and enhancement within the peripheral aspect of the medial malleolus and medial talus with loss of cortical definition and low T1 bone marrow signal compatible with acute osteomyelitis (series 5 and 6, image 10). Susceptibility within the tibiotalar joint space suggesting air tracking intra-articularly. Trace tibiotalar joint effusion with enhancing synovium concerning for septic arthritis. Trace talonavicular joint effusion with marrow edema and cortical irregularity along both sides of the joint (series 8 and 9, image 12). Findings are suspicious for septic arthritis and osteomyelitis. Similar findings are present at the calcaneocuboid joint with small joint effusion and associated  subchondral marrow edema (series 9, image 17). Marrow edema within the residual great toe distal phalanx (series 9, image 10) raises suspicion for osteomyelitis. There is evidence of previous resection of the distal tuft. Additional ulceration over the posterior calcaneus with marrow edema and low T1 signal along the peripheral margin of the  posterior calcaneus suspicious for osteomyelitis (series 1 and 2, image 11). Tendons: The posterior tibialis and flexor digitorum longus tendons extend into the area of ulceration at the lateral ankle and are not well delineated, and may be torn. Flexor hallucis longus tendon intact. Tendinosis of the distal Achilles tendon with poor delineation of the distal insertion at site of likely pressure ulceration. Peroneal tendons appear intact. Ligaments: LisFranc ligament intact. Deltoid and spring ligament complex are poorly evaluated at site of ulceration, likely torn. Lateral ankle tendons grossly intact. Soft tissues: Deep soft tissue ulceration at the lateral ankle, as described above. Extensive surrounding soft tissue edema and enhancement compatible with cellulitis. No drainable fluid collection. Extensive pressure ulceration at the posterior hindfoot. There are extensive foci of susceptibility tracking within the soft tissues of the lateral forefoot (series 8, images 22-23). Additional areas of more superficial ulceration along the plantar aspect of the midfoot and forefoot with surrounding cellulitis. IMPRESSION: 1. Deep soft tissue ulceration along the medial aspect of the ankle with ulcer base extending to the medial cortex of the medial malleolus and talus. Associated signal changes within the peripheral aspects of the medial malleolus and medial talus compatible with acute osteomyelitis. 2. Trace tibiotalar joint effusion with enhancing synovium concerning for septic arthritis. There appears to be air or gas within the tibiotalar joint, possibly extending from adjacent ulceration. 3. Trace talonavicular joint effusion with marrow edema and cortical irregularity along both sides of the joint suspicious for septic arthritis and osteomyelitis. 4. Similar findings at the calcaneocuboid joint also suspicious for septic arthritis and osteomyelitis. 5. Additional ulceration over the at the posterior hindfoot with  acute osteomyelitis of the posterior calcaneus. 6. Marrow edema within the residual great toe distal phalanx which may reflect osteitis versus early osteomyelitis. 7. Extensive cellulitis with numerous foci of susceptibility tracking within the soft tissues of the lateral forefoot concerning for gas-forming infection. No drainable fluid collection. 8. Tendinous and ligamentous abnormalities, as above. These results will be called to the ordering clinician or representative by the Radiologist Assistant, and communication documented in the PACS or [employer]. Electronically Signed   By: Duanne Guess D.O.   On: 05/13/2020 15:56   MR ANKLE LEFT W WO CONTRAST  Result Date: 05/13/2020 CLINICAL DATA:  Nonhealing pressure all ulcers of the left foot and ankle EXAM: MRI OF THE LEFT FOREFOOT WITHOUT AND WITH CONTRAST; MRI OF THE LEFT ANKLE WITHOUT AND WITH CONTRAST TECHNIQUE: Multiplanar, multisequence MR imaging of the left foot and ankle was performed both before and after administration of intravenous contrast. CONTRAST:  64mL GADAVIST GADOBUTROL 1 MMOL/ML IV SOLN COMPARISON:  X-ray 05/13/2020 FINDINGS: Bones: There is a deep soft tissue ulceration along the medial aspect of the ankle with ulcer base closely approximating the medial cortex of the medial malleolus and talus. Associated bone marrow edema and enhancement within the peripheral aspect of the medial malleolus and medial talus with loss of cortical definition and low T1 bone marrow signal compatible with acute osteomyelitis (series 5 and 6, image 10). Susceptibility within the tibiotalar joint space suggesting air tracking intra-articularly. Trace tibiotalar joint effusion with enhancing synovium concerning for septic arthritis. Trace talonavicular joint effusion  with marrow edema and cortical irregularity along both sides of the joint (series 8 and 9, image 12). Findings are suspicious for septic arthritis and osteomyelitis. Similar findings are  present at the calcaneocuboid joint with small joint effusion and associated subchondral marrow edema (series 9, image 17). Marrow edema within the residual great toe distal phalanx (series 9, image 10) raises suspicion for osteomyelitis. There is evidence of previous resection of the distal tuft. Additional ulceration over the posterior calcaneus with marrow edema and low T1 signal along the peripheral margin of the posterior calcaneus suspicious for osteomyelitis (series 1 and 2, image 11). Tendons: The posterior tibialis and flexor digitorum longus tendons extend into the area of ulceration at the lateral ankle and are not well delineated, and may be torn. Flexor hallucis longus tendon intact. Tendinosis of the distal Achilles tendon with poor delineation of the distal insertion at site of likely pressure ulceration. Peroneal tendons appear intact. Ligaments: LisFranc ligament intact. Deltoid and spring ligament complex are poorly evaluated at site of ulceration, likely torn. Lateral ankle tendons grossly intact. Soft tissues: Deep soft tissue ulceration at the lateral ankle, as described above. Extensive surrounding soft tissue edema and enhancement compatible with cellulitis. No drainable fluid collection. Extensive pressure ulceration at the posterior hindfoot. There are extensive foci of susceptibility tracking within the soft tissues of the lateral forefoot (series 8, images 22-23). Additional areas of more superficial ulceration along the plantar aspect of the midfoot and forefoot with surrounding cellulitis. IMPRESSION: 1. Deep soft tissue ulceration along the medial aspect of the ankle with ulcer base extending to the medial cortex of the medial malleolus and talus. Associated signal changes within the peripheral aspects of the medial malleolus and medial talus compatible with acute osteomyelitis. 2. Trace tibiotalar joint effusion with enhancing synovium concerning for septic arthritis. There appears to  be air or gas within the tibiotalar joint, possibly extending from adjacent ulceration. 3. Trace talonavicular joint effusion with marrow edema and cortical irregularity along both sides of the joint suspicious for septic arthritis and osteomyelitis. 4. Similar findings at the calcaneocuboid joint also suspicious for septic arthritis and osteomyelitis. 5. Additional ulceration over the at the posterior hindfoot with acute osteomyelitis of the posterior calcaneus. 6. Marrow edema within the residual great toe distal phalanx which may reflect osteitis versus early osteomyelitis. 7. Extensive cellulitis with numerous foci of susceptibility tracking within the soft tissues of the lateral forefoot concerning for gas-forming infection. No drainable fluid collection. 8. Tendinous and ligamentous abnormalities, as above. These results will be called to the ordering clinician or representative by the Radiologist Assistant, and communication documented in the PACS or [employer]. Electronically Signed   By: Duanne Guess D.O.   On: 05/13/2020 15:56   DG Chest Port 1 View  Result Date: 05/13/2020 CLINICAL DATA:  Sepsis EXAM: PORTABLE CHEST 1 VIEW COMPARISON:  Portable exam 1041 hours without priors for comparison FINDINGS: Normal heart size, mediastinal contours, and pulmonary vascularity. Lungs clear. No pulmonary infiltrate, pleural effusion or pneumothorax. Bones demineralized. IMPRESSION: No acute abnormalities. Electronically Signed   By: Ulyses Southward M.D.   On: 05/13/2020 11:05   DG Foot Complete Left  Result Date: 05/13/2020 CLINICAL DATA:  Multiple nonhealing pressure ulcers, sepsis EXAM: LEFT FOOT - COMPLETE 4 VIEW COMPARISON:  None FINDINGS: Nonstandard positioning due to patient condition. Marked osseous demineralization. Diffuse soft tissue swelling LEFT foot with foci of soft tissue gas at the plantar aspect of the foot at the level of the fourth  and fifth metatarsals. This could be due to open wound  or infection by a gas-forming organism. 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 Advanced degenerative changes at the talonavicular joint with superior navicular subluxation, could represent inflammatory arthropathy or septic arthritis. Probable ankle joint effusion. Additional degenerative changes at the naviculocuneiform joint and at the calcaneocuboid joint. No acute fracture, dislocation, or additional bone destruction. Lucency projecting over ankle joint on lateral view likely represents an additional ulcer though this is less well localized due to nonstandard positioning. IMPRESSION: Soft tissue gas at plantar aspect of lateral LEFT foot at the level of the fourth and fifth metatarsals either due to open wound or infection by a gas-forming organism. Osseous demineralization with scattered degenerative changes as well as destructive changes and subluxation at the talonavicular joint, could represent inflammatory arthropathy but is concerning for septic arthritis. Suspected ankle joint effusion. Findings called to Harlene Salts PA on 05/13/2020 at 1110 hours. Electronically Signed   By: Ulyses Southward M.D.   On: 05/13/2020 11:12    Time Spent in minutes  30   Susa Raring M.D on 05/14/2020 at 2:09 PM  To page go to [email or web address] - password [facility]

## 2020-05-14 NOTE — Progress Notes (Signed)
Initial Nutrition Assessment  DOCUMENTATION CODES:   Not applicable  INTERVENTION:  Provide Ensure Enlive po BID, each supplement provides 350 kcal and 20 grams of protein.  Provide 30 ml Prosource Plus po once daily, each supplement provides 100 kcal and 15 grams of protein.   Encourage adequate PO intake.   NUTRITION DIAGNOSIS:   Increased nutrient needs related to wound healing as evidenced by estimated needs.  GOAL:   Patient will meet greater than or equal to 90% of their needs  MONITOR:   PO intake, Supplement acceptance, Skin, Weight trends, Labs, I & O's  REASON FOR ASSESSMENT:   Consult Assessment of nutrition requirement/status  ASSESSMENT:   74 y.o. female with medical history significant of multiple sclerosis, dementia, hypertension and bed bound presents with worsening left foot infection, decrease appetite and worsening confusion. Pt with positive sepsis criteria in the setting of left foot osteomyelitis.   RD working remotely.  Pt with confusion per MD and has history of dementia. RD unable to obtain pt nutrition history at this time. RD to order nutritional supplements to aid in increased caloric and protein needs as well as in wound healing. Unable to complete Nutrition-Focused physical exam at this time.   Labs and medications reviewed.   Diet Order:   Diet Order            DIET DYS 3 Room service appropriate? Yes; Fluid consistency: Thin  Diet effective now                 EDUCATION NEEDS:   Not appropriate for education at this time  Skin:  Skin Assessment: Skin Integrity Issues: Skin Integrity Issues:: Unstageable Unstageable: buttocks, ankle, R knee  Last BM:  7/9  Height:   Ht Readings from Last 1 Encounters:  05/13/20 5\' 6"  (1.676 m)    Weight:   Wt Readings from Last 1 Encounters:  05/13/20 63.2 kg    BMI:  Body mass index is 22.49 kg/m.  Estimated Nutritional Needs:   Kcal:  1850-2050  Protein:  95-105  grams  Fluid:  >/= 1.8 L/day   07/14/20, MS, RD, LDN RD pager number/after hours weekend pager number on Amion.

## 2020-05-14 NOTE — Evaluation (Signed)
Physical Therapy Evaluation/Discharge Patient Details Name: Tracy Armstrong MRN: [id number] DOB: November 21, 1945 Today's Date: 05/14/2020   History of Present Illness  74 y.o. female admitted on 05/13/20 for decreased apetite, confusion and worsening left foot wound.  Pt dx with sepsis due to left foot cellulitis along with osteomyelitis.  Ortho consulted, wound care RN consulted.  Pt also with chronic deconditioning, cachexia, MS, severe anemia.  Pt with significant PMH of MS, dementia.   Clinical Impression  Pt with significant, severe 4 extremity contractures and multiple wounds from positional contractures.  She does not do much at baseline to help in her own care.  Daughter turns her, bathes her, and lifts her to the [facility] for transport.  At some point recently she was able to assist in self feeding and daughter is interested in some resting hand splints if appropriate to help decrease the progression of her bil UE contractures, so I do encourage OT assessment.  PT to sign off.     Follow Up Recommendations No PT follow up    Equipment Recommendations  None recommended by PT    Recommendations for Other Services   NA    Precautions / Restrictions Precautions Precautions: Other (comment) Precaution Comments: multiple wounds       Mobility  Bed Mobility Overal bed mobility: Needs Assistance Bed Mobility: Rolling Rolling: +2 for physical assistance;Total assist         General bed mobility comments: Two person total assist to roll bil.  It is easier for pt to tolerate rolling to the right.  Assisted RN with sacrum dressing changes.    Transfers                 General transfer comment: I spoke with pt's daughter re: hoyer lift and she states that it scares her mom too much she would rather just pick her up to get to the [facility].   Ambulation/Gait             General Gait Details: Pt is non ambulatory               Pertinent Vitals/Pain Pain Assessment: Faces Faces  Pain Scale: Hurts little more Pain Location: with rolling L Pain Descriptors / Indicators: Grimacing;Guarding Pain Intervention(s): Limited activity within patient's tolerance;Monitored during session;Repositioned    Home Living Family/patient expects to be discharged to:: Private residence Living Arrangements: Children Available Help at Discharge: Family;Available 24 hours/day (pt has 3 daughters, one lives with her and is her caretaker) Type of Home: House Home Access: Ramped entrance     Home Layout: One level Home Equipment: Wheelchair - manual;Hospital bed (air mattress)      Prior Function Level of Independence: Needs assistance   Gait / Transfers Assistance Needed: total assist for bed mobility, transfer to WC  ADL's / Homemaking Assistance Needed: used to self feed, total care for bathing, dressing, toilets in her depends.         Hand Dominance   Dominant Hand: Right    Extremity/Trunk Assessment   Upper Extremity Assessment Upper Extremity Assessment: Defer to OT evaluation    Lower Extremity Assessment Lower Extremity Assessment: RLE deficits/detail;LLE deficits/detail RLE Deficits / Details: Bil LEs with significant hard end feel contractures.  Bil LE edema.  Multiple wounds on feet, knees, sacrum.   LLE Deficits / Details: Bil LEs with significant hard end feel contractures.  Bil LE edema.  Multiple wounds on feet, knees, sacrum.      Cervical / Trunk Assessment  Cervical / Trunk Assessment: Kyphotic  Communication   Communication: No difficulties  Cognition Arousal/Alertness: Awake/alert Behavior During Therapy: WFL for tasks assessed/performed Overall Cognitive Status: History of cognitive impairments - at baseline                                 General Comments: Pt is awake, answers some questions, sharp tongue and whitty.  She has baseline dementia.              Assessment/Plan    PT Assessment Patent does not need any further  PT services  PT Problem List Decreased strength;Decreased range of motion;Decreased activity tolerance;Decreased balance;Decreased mobility;Decreased coordination;Decreased cognition;Decreased knowledge of use of DME;Decreased safety awareness;Decreased knowledge of precautions;Impaired sensation;Pain;Decreased skin integrity           PT Goals (Current goals can be found in the Care Plan section)  Acute Rehab PT Goals PT Goal Formulation: All assessment and education complete, DC therapy     AM-PAC PT "6 Clicks" Mobility  Outcome Measure Help needed turning from your back to your side while in a flat bed without using bedrails?: Total Help needed moving from lying on your back to sitting on the side of a flat bed without using bedrails?: Total Help needed moving to and from a bed to a chair (including a wheelchair)?: Total Help needed standing up from a chair using your arms (e.g., wheelchair or bedside chair)?: Total Help needed to walk in hospital room?: Total Help needed climbing 3-5 steps with a railing? : Total 6 Click Score: 6    End of Session   Activity Tolerance: Patient tolerated treatment well Patient left: in bed Nurse Communication: Mobility status;Other (comment) (uncovered wounds) PT Visit Diagnosis: Muscle weakness (generalized) (M62.81);Difficulty in walking, not elsewhere classified (R26.2)    Time: 1287-8676 PT Time Calculation (min) (ACUTE ONLY): 38 min   Charges:          Corinna Capra, PT, DPT  Acute Rehabilitation [phone] pager #[phone] office     PT Evaluation $PT Eval Moderate Complexity: 1 Mod PT Treatments $Therapeutic Activity: 23-37 mins

## 2020-05-14 NOTE — Consult Note (Signed)
Palliative Medicine Inpatient Consult Note  Reason for consult:  Goals of Care and Advanced Directives  HPI:  Per intake H&P --> Tracy Armstrong is a 74 y.o. female with medical history significant of multiple sclerosis, dementia, hypertension and bedbound; who presented to emergency department secondary to worsening left foot infection, decrease appetite and worsening confusion. Patient's daughter reports over the last month mom has been actively declining, eating less and with worsening drainage and appearance in her chronic left foot wound. No fever, no chills, no nausea, no vomiting, no CP, no diarrhea, no hematuria, no dysuria, no melena or any other complaints.   Palliative care was asked to get involved to aid in goals of care conversations.  Clinical Assessment/Goals of Care: I have reviewed medical records including EPIC notes, labs and imaging, received report from bedside RN, assessed Tracy Armstrong. Tracy Armstrong was lying in bed I asked her some basic orientation questions she is aware of who she is though not much else.  She is frail and cachectic.    I called Tracy Armstrong (youngest daughter) to further discuss diagnosis prognosis, GOC, EOL wishes, disposition and options.   I introduced Palliative Medicine as specialized medical care for people living with serious illness. It focuses on providing relief from the symptoms and stress of a serious illness. The goal is to improve quality of life for both the patient and the family.  I asked Tracy Armstrong to tell me about her mother. She shares that she was born and raised in [place]. She was married to her father for over forty years and they share three children; Tracy Armstrong, and herself. Her father passed away suddenly two years ago and the feels her mother never really recovered from that. 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 She considers herself a religious woman and is of the [employer].   I asked Tracy Armstrong  how her mother had been doing prior to hospitalization. She shares that she has been her mothers primary caregiver for the past twenty five years and most recently in the last ten years she has been more debilitated. Tracy Armstrong states that after a gallbladder surgery a decade ago her mother transitioned into the state that she is in presently. She expressed guilt for not being able to keep her from having pressure injuries. I allowed time for Kamisha to express herself.  I asked Tracy Armstrong what she understood about her mothers present health state. She said that she knows her poor nutrition and circulation have contributed to her pressure injuries. I asked her to tell me more about her mothers dementia. We discussed the progressive nature of dementia and that often in the later stages patients no longer feel the need or want to eat and drink. I shared with Tracy Armstrong that I worry even if an amputation is done that her mother will continue to have ongoing complications.   A detailed discussion was had today regarding advanced directives, Tracy Armstrong shares that her mother never completed this though she would like for these to be completed if possible.  Concepts specific to code status, artifical feeding and hydration, continued IV antibiotics and rehospitalization was had.  I shared with Tracy Armstrong the reality that in true code situations in patients like her mother <2% survive. I shared that often with an abysmal quality of life often confined to machines and the bed. I asked Tracy Armstrong if she feels that her mother would want that type of life? She said that her  mother has been her whole life for the past twenty five years and she is not ready to let her go. I told her that in all honesty it's about the bigger picture and what she believes her mother would have wanted for herself.  Tracy Armstrong expressed complicated grief in regard to her fathers death two years ago. She shares that she never grieved his loss because it was her  job to be strong and care for her mother. I explained to her that it's important that she face that grief as the reality is that none of [place] are going to live forever. I shared that its truly important that we consider what's best for the patient.   Discussed the importance of continued conversation with family and their  medical providers regarding overall plan of care and treatment options, ensuring decisions are within the context of the patients values and GOCs.  Provided patients daughter, Tracy Armstrong an electronic version of "Hard Choices for [employer]" booklet via email  At this point in time we plan to focus on rapport building with the patients family .  Decision Maker: Patient has three daughters who help to make decisions though Tracy Armstrong (youngest daughter) is patients primary caregiver  SUMMARY OF RECOMMENDATIONS   Full Code --> I strongly recommended consideration of DNR given that patient will likely endure great trauma if undergoes a true code  Full scope of care  Ongoing GOC conversations  Chaplain support for advanced directive completion and support daughter has complicated grief from the loss of her father   Code Status/Advance Care Planning: FULL CODE  Palliative Prophylaxis:   Oral care, ROM, delirium precuations  Additional Recommendations (Limitations, Scope, Preferences):  Full scope of care   Psycho-social/Spiritual:   Desire for further Chaplaincy support: Yes  Additional Recommendations: Education on progressive nature of dementia   Prognosis: Extremely poor in the setting of progressive dementia with FTT and hypoalbuminemia  Discharge Planning: Unclear presently  PPS: 20%    This conversation/these recommendations were discussed with patient primary care team, Dr. Thedore Mins  Time In: 1230 Time Out: 1400 Total Time: 90 Greater than 50%  of this time was spent counseling and coordinating care related to the above assessment and  plan.  Tracy Armstrong Bear [facility] Palliative Medicine Team Team Cell Phone: [phone] Please utilize secure chat with additional questions, if there is no response within 30 minutes please call the above phone number  Palliative Medicine Team providers are available by phone from 7am to 7pm daily and can be reached through the team cell phone.  Should this patient require assistance outside of these hours, please call the patient's attending physician.

## 2020-05-14 NOTE — Consult Note (Signed)
Orthopaedic Trauma Service (OTS) Consult   Patient ID: Tracy Armstrong MRN: [id number] DOB/AGE: 72-Aug-1947 72 y.o.  Reason for Consult:Left foot infection Referring Physician: Dr. Gwenlyn Perking, MD [facility]  HPI: Tracy Armstrong is an 74 y.o. female who is being seen in consultation at the request of Dr. Gwenlyn Perking for evaluation of left foot infection.  History is provided by the chart and also her daughter who I discussed over the phone.  The patient has a significant history of multiple sclerosis, dementia, hypertension and multiple contractures who presented to [facility] emergency room with a worsening left foot infection.  She also had decreased appetite and confusion.  The patient has had decreased appetite for the last 2 to 3 months.  It is worse in the last week as she developed worsening ulcers around her legs.  The left foot progressively got worse over the last 3 to 4 days.  She presented to the emergency room where she was subsequently admitted and transferred here for evaluation.  Patient was seen and evaluated at bedside.  She states that she is having no pain however she does note that the wound in the foot smell.  Otherwise she does not have any contribution to further history.  I reviewed pictures that were in the chart I also had of the dressing taken down at bedside today.  Past Medical History:  Diagnosis Date  . Multiple sclerosis (HCC) 2009    Past Surgical History:  Procedure Laterality Date  . CHOLECYSTECTOMY      History reviewed. No pertinent family history.  Social History:  reports that she has never smoked. She has never used smokeless tobacco. She reports that she does not drink alcohol and does not use drugs.  Allergies: No Known Allergies  Medications:  No current facility-administered medications on file prior to encounter.   Current Outpatient Medications on File Prior to Encounter  Medication Sig Dispense Refill  . bisoprolol-hydrochlorothiazide (ZIAC)  5-6.25 MG tablet Take 1 tablet by mouth daily.    Marland Kitchen donepezil (ARICEPT) 10 MG tablet Take 10 mg by mouth at bedtime.     . furosemide (LASIX) 20 MG tablet Take 20 mg by mouth daily.    . mirtazapine (REMERON) 7.5 MG tablet Take 7.5 mg by mouth at bedtime.      ROS: Unable to obtain due to her baseline dementia.  Exam: Blood pressure 91/62, pulse 100, temperature (!) 97.3 F (36.3 C), temperature source Oral, resp. rate 16, height 5\' 6"  (1.676 m), weight 63.2 kg, SpO2 98 %. General: No acute distress Orientation: Awake alert and oriented Mood and Affect: Cooperative and pleasant Gait: Nonambulatory Coordination and balance: Does not have any ability to provide coordination proprioception  Left lower extremity: Contracted and adducted position with hip and knee flexion contractures.  There is a gross smell to the foot.  The dressing was taken down which shows a necrotic purulent ulcer at the bottom of her foot that probes down to bone.  There was also a necrotic ulcer on the medial heel.  She does not have any motor function sensory function is nonreliable.  Her foot is warm.  However I was not able to palpate pulses.  Right lower extremity: Contracted lower extremity with wound over the medial lower leg that has a necrotic center.  The remainder of the wound is pink and viable without any signs of overt infection.  Unable to perform any motor function and sensory function is unreliable.   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  Significant destructive changes in the tibiotalar and midfoot region.  Labs:  Results for orders placed or performed during the hospital encounter of 05/13/20 (from the past 24 hour(s))  SARS Coronavirus 2 by RT PCR (hospital order, performed in [facility] hospital lab) Nasopharyngeal Nasopharyngeal Swab     Status: None   Collection Time: 05/13/20 10:59 AM    Specimen: Nasopharyngeal Swab  Result Value Ref Range   SARS Coronavirus 2 NEGATIVE NEGATIVE  Lactic acid, plasma     Status: Abnormal   Collection Time: 05/13/20 11:45 AM  Result Value Ref Range   Lactic Acid, Venous 2.8 (HH) 0.5 - 1.9 mmol/L  Comprehensive metabolic panel     Status: Abnormal   Collection Time: 05/13/20 11:45 AM  Result Value Ref Range   Sodium 135 135 - 145 mmol/L   Potassium 3.9 3.5 - 5.1 mmol/L   Chloride 103 98 - 111 mmol/L   CO2 21 (L) 22 - 32 mmol/L   Glucose, Bld 122 (H) 70 - 99 mg/dL   BUN 20 8 - 23 mg/dL   Creatinine, Ser 3.24 0.44 - 1.00 mg/dL   Calcium 8.3 (L) 8.9 - 10.3 mg/dL   Total Protein 6.9 6.5 - 8.1 g/dL   Albumin 2.0 (L) 3.5 - 5.0 g/dL   AST 13 (L) 15 - 41 U/L   ALT 9 0 - 44 U/L   Alkaline Phosphatase 135 (H) 38 - 126 U/L   Total Bilirubin 0.5 0.3 - 1.2 mg/dL   GFR calc non Af Amer >60 >60 mL/min   GFR calc Af Amer >60 >60 mL/min   Anion gap 11 5 - 15  CBC WITH DIFFERENTIAL     Status: Abnormal   Collection Time: 05/13/20 11:45 AM  Result Value Ref Range   WBC 22.0 (H) 4.0 - 10.5 K/uL   RBC 3.38 (L) 3.87 - 5.11 MIL/uL   Hemoglobin 8.4 (L) 12.0 - 15.0 g/dL   HCT 40.1 (L) 36 - 46 %   MCV 88.5 80.0 - 100.0 fL   MCH 24.9 (L) 26.0 - 34.0 pg   MCHC 28.1 (L) 30.0 - 36.0 g/dL   RDW 02.7 (H) 25.3 - 66.4 %   Platelets 192 150 - 400 K/uL   nRBC 0.0 0.0 - 0.2 %   Neutrophils Relative % 87 %   Neutro Abs 19.1 (H) 1.7 - 7.7 K/uL   Lymphocytes Relative 7 %   Lymphs Abs 1.6 0.7 - 4.0 K/uL   Monocytes Relative 5 %   Monocytes Absolute 1.0 0 - 1 K/uL   Eosinophils Relative 0 %   Eosinophils Absolute 0.0 0 - 0 K/uL   Basophils Relative 0 %   Basophils Absolute 0.0 0 - 0 K/uL   WBC Morphology DOHLE BODIES    Immature Granulocytes 1 %   Abs Immature Granulocytes 0.27 (H) 0.00 - 0.07 K/uL  APTT     Status: None   Collection Time: 05/13/20 11:45 AM  Result Value Ref Range   aPTT 26 24 - 36 seconds  Protime-INR     Status: Abnormal   Collection  Time: 05/13/20 11:45 AM  Result Value Ref Range   Prothrombin Time 15.7 (H) 11.4 - 15.2 seconds   INR 1.3 (H) 0.8 - 1.2  Blood Culture (routine x 2)     Status: None (Preliminary result)   Collection Time: 05/13/20 11:45 AM  Specimen: BLOOD  Result Value Ref Range   Specimen Description BLOOD    Special Requests NONE    Culture      NO GROWTH < 24 HOURS Performed at [facility], [address], [place], [place] [address]    Report Status PENDING   Blood Culture (routine x 2)     Status: None (Preliminary result)   Collection Time: 05/13/20 11:45 AM   Specimen: BLOOD  Result Value Ref Range   Specimen Description BLOOD RIGHT ANTECUBITAL    Special Requests      BOTTLES DRAWN AEROBIC AND ANAEROBIC Blood Culture adequate volume   Culture      NO GROWTH < 24 HOURS Performed at [facility], [address], [place], [place] [address]    Report Status PENDING   Phosphorus     Status: None   Collection Time: 05/13/20  6:49 PM  Result Value Ref Range   Phosphorus 2.5 2.5 - 4.6 mg/dL  Magnesium     Status: None   Collection Time: 05/13/20  6:49 PM  Result Value Ref Range   Magnesium 1.9 1.7 - 2.4 mg/dL  TSH     Status: None   Collection Time: 05/13/20  6:49 PM  Result Value Ref Range   TSH 0.803 0.350 - 4.500 uIU/mL  Vitamin B12     Status: None   Collection Time: 05/13/20  6:49 PM  Result Value Ref Range   Vitamin B-12 639 180 - 914 pg/mL  Lactic acid, plasma     Status: Abnormal   Collection Time: 05/13/20  6:51 PM  Result Value Ref Range   Lactic Acid, Venous <0.3 (L) 0.5 - 1.9 mmol/L  CBG monitoring, ED     Status: None   Collection Time: 05/13/20  8:31 PM  Result Value Ref Range   Glucose-Capillary 71 70 - 99 mg/dL  Brain natriuretic peptide     Status: Abnormal   Collection Time: 05/14/20  7:56 AM  Result Value Ref Range   B Natriuretic Peptide 812.7 (H) 0.0 - 100.0 pg/mL  Basic metabolic panel     Status: Abnormal   Collection Time: 05/14/20  7:56 AM  Result  Value Ref Range   Sodium 138 135 - 145 mmol/L   Potassium 3.5 3.5 - 5.1 mmol/L   Chloride 107 98 - 111 mmol/L   CO2 23 22 - 32 mmol/L   Glucose, Bld 81 70 - 99 mg/dL   BUN 16 8 - 23 mg/dL   Creatinine, Ser 7.84 0.44 - 1.00 mg/dL   Calcium 8.1 (L) 8.9 - 10.3 mg/dL   GFR calc non Af Amer >60 >60 mL/min   GFR calc Af Amer >60 >60 mL/min   Anion gap 8 5 - 15  CBC     Status: Abnormal   Collection Time: 05/14/20  7:56 AM  Result Value Ref Range   WBC 18.2 (H) 4.0 - 10.5 K/uL   RBC 2.97 (L) 3.87 - 5.11 MIL/uL   Hemoglobin 7.2 (L) 12.0 - 15.0 g/dL   HCT 69.6 (L) 36 - 46 %   MCV 81.1 80.0 - 100.0 fL   MCH 24.2 (L) 26.0 - 34.0 pg   MCHC 29.9 (L) 30.0 - 36.0 g/dL   RDW 29.5 (H) 28.4 - 13.2 %   Platelets 173 150 - 400 K/uL   nRBC 0.0 0.0 - 0.2 %  C-reactive protein     Status: Abnormal   Collection Time: 05/14/20  7:56 AM  Result Value  Ref Range   CRP 20.9 (H) <1.0 mg/dL  Magnesium     Status: None   Collection Time: 05/14/20  7:56 AM  Result Value Ref Range   Magnesium 1.8 1.7 - 2.4 mg/dL    Imaging or Labs ordered: None  Medical history and chart was reviewed and case discussed with medical provider.  Assessment/Plan: 74 year old female with severe contractures and significant left foot infection with underlying osteomyelitis.  I did perform a bedside debridement today with a 11 blade to debride some of the necrotic tissue at the base of the wound.  I performed wet-to-dry dressings and packing of the wound.  Her infection is significant and with her contractures and ulcers in that leg I would recommend proceeding with a below-knee amputation.  I do not feel that local wound care will clear the infection.  I will likely consult my colleague Dr. [name].  I did discuss this over the phone with her daughter.  She agrees that this is likely the best choice however they would like to discuss it with her mother as well as her other siblings.  I will recommend wound care consult for likely  wet-to-dry dressings and if there is other debridement options including Silvadene would defer to them.  Continue IV antibiotics per the hospitalist team.  We will plan to continue to follow and will likely tentatively plan for an amputation early to mid next week.  Roby Lofts, MD [facility] [phone] (office) [email or web address]

## 2020-05-14 NOTE — Consult Note (Signed)
Returned call of pt's daughter Tracy Armstrong, who asked could her mother complete an AD tomorrow. She said pt would like to do so before scheduled surgery later this upcoming week. Explained AD's cannot be done on weekend, as there are no notaries; family need not be present, as they cannot be witnesses; and it's her mother who will have to answer the questions; as family members cannot do that for her.   Tracy Armstrong to call Tracy Armstrong, spiritual care office, Monday morning to see if it's possible to schedule a notary at a time her sister Tracy Armstrong (who will be HCPOA) could also come in. (She is off work Mondays.) Will leave F/U referral also for unit chaplain.   Rev. Donnel Saxon Chaplain

## 2020-05-14 NOTE — Plan of Care (Signed)
New admit

## 2020-05-14 NOTE — Consult Note (Signed)
WOC Nurse Consult Note: Reason for Consult: WOC Nurse simultaneously consulted with orthopedics for assessment and recommendations of bilateral LE wounds, L>R.  Orthopedics has recommended amputation of the LLE, I am not uncertain that for her quality of life and future positioning that both LEs are not candidates for amputation, but I will leave that discussion and decision to the physicians and the family. Topical wound care guidance for the RLE is provided. Dr. Lorra Hals has written his conservative recommendations for interim dressing to the left LE in his note but not in the Orders.  I will transcribe them for him there. Wound type: chronic, nonhealing full thickness Pressure Injury POA: N/A Measurement:RLE wound measures 5cm x 2.5cm x 0.2cm and is 80% red, 20% with nonviable slough. Wound bed:As noted above Drainage (amount, consistency, odor) small amount serous Periwound: intact, dry Dressing procedure/placement/frequency: I will recommend silver hydrofiber (Aquacel, Lawson # [id number]) to the RLE wound with daily changed.  The LLE and heel will be dressed with saline mositened gauze, topped with dry gauze and changed daily, per Dr. Jena Gauss' note.  Patient is severely contracted and pressure redistribution boots would not be successful.  She does not require a mattress replacement with low air loss feature as only the LEs have wounds and the staff and family are able to off-load her back and buttocks sufficiently.  I appreciate the opportunity to consider many options to improve the patient's POC with her Bedside RN today, Johnny Bridge. Her expertise and compassion are appreciated.  WOC nursing team will not follow, but will remain available to this patient, the nursing and medical teams.  Please re-consult if needed. Thanks, Ladona Mow, MSN, RN, GNP, [place]  Pager# [phone]

## 2020-05-15 DIAGNOSIS — Z515 Encounter for palliative care: Secondary | ICD-10-CM

## 2020-05-15 DIAGNOSIS — Z7189 Other specified counseling: Secondary | ICD-10-CM

## 2020-05-15 LAB — COMPREHENSIVE METABOLIC PANEL
ALT: 8 U/L (ref 0–44)
AST: 11 U/L — ABNORMAL LOW (ref 15–41)
Albumin: 1.3 g/dL — ABNORMAL LOW (ref 3.5–5.0)
Alkaline Phosphatase: 94 U/L (ref 38–126)
Anion gap: 9 (ref 5–15)
BUN: 12 mg/dL (ref 8–23)
CO2: 23 mmol/L (ref 22–32)
Calcium: 7.9 mg/dL — ABNORMAL LOW (ref 8.9–10.3)
Chloride: 108 mmol/L (ref 98–111)
Creatinine, Ser: 0.5 mg/dL (ref 0.44–1.00)
GFR calc Af Amer: >60 mL/min (ref >60)
GFR calc non Af Amer: >60 mL/min (ref >60)
Glucose, Bld: 77 mg/dL (ref 70–99)
Potassium: 3.5 mmol/L (ref 3.5–5.1)
Sodium: 140 mmol/L (ref 135–145)
Total Bilirubin: 0.2 mg/dL — ABNORMAL LOW (ref 0.3–1.2)
Total Protein: 5.6 g/dL — ABNORMAL LOW (ref 6.5–8.1)

## 2020-05-15 LAB — BLOOD CULTURE ID PANEL (REFLEXED)

## 2020-05-15 LAB — CBC WITH DIFFERENTIAL/PLATELET
Abs Immature Granulocytes: 0.14 10*3/uL — ABNORMAL HIGH (ref 0.00–0.07)
Basophils Absolute: 0 10*3/uL (ref 0.0–0.1)
Basophils Relative: 0 %
Eosinophils Absolute: 0.1 10*3/uL (ref 0.0–0.5)
Eosinophils Relative: 1 %
HCT: 21.5 % — ABNORMAL LOW (ref 36.0–46.0)
Hemoglobin: 6.2 g/dL — CL (ref 12.0–15.0)
Immature Granulocytes: 1 %
Lymphocytes Relative: 15 %
Lymphs Abs: 2.6 10*3/uL (ref 0.7–4.0)
MCH: 23.9 pg — ABNORMAL LOW (ref 26.0–34.0)
MCHC: 28.8 g/dL — ABNORMAL LOW (ref 30.0–36.0)
MCV: 83 fL (ref 80.0–100.0)
Monocytes Absolute: 0.9 10*3/uL (ref 0.1–1.0)
Monocytes Relative: 5 %
Neutro Abs: 13.6 10*3/uL — ABNORMAL HIGH (ref 1.7–7.7)
Neutrophils Relative %: 78 %
Platelets: 199 10*3/uL (ref 150–400)
RBC: 2.59 MIL/uL — ABNORMAL LOW (ref 3.87–5.11)
RDW: 17.6 % — ABNORMAL HIGH (ref 11.5–15.5)
WBC: 17.2 10*3/uL — ABNORMAL HIGH (ref 4.0–10.5)
nRBC: 0 % (ref 0.0–0.2)

## 2020-05-15 LAB — MAGNESIUM: Magnesium: 1.8 mg/dL (ref 1.7–2.4)

## 2020-05-15 LAB — C-REACTIVE PROTEIN: CRP: 18.2 mg/dL — ABNORMAL HIGH (ref <1.0)

## 2020-05-15 LAB — PROCALCITONIN: Procalcitonin: 0.16 ng/mL

## 2020-05-15 LAB — BRAIN NATRIURETIC PEPTIDE: B Natriuretic Peptide: 149.8 pg/mL — ABNORMAL HIGH (ref 0.0–100.0)

## 2020-05-15 LAB — PREPARE RBC (CROSSMATCH)

## 2020-05-15 LAB — MRSA PCR SCREENING: MRSA by PCR: NEGATIVE

## 2020-05-15 MED ORDER — PIPERACILLIN-TAZOBACTAM 3.375 G IVPB
3.3750 g | Freq: Three times a day (TID) | INTRAVENOUS | Status: DC
  Administered 2020-05-16: 3.375 g via INTRAVENOUS

## 2020-05-15 MED ORDER — SODIUM CHLORIDE 0.9% IV SOLUTION: Freq: Once | INTRAVENOUS | Status: AC

## 2020-05-15 MED ORDER — FUROSEMIDE 10 MG/ML IJ SOLN
20.0000 mg | Freq: Once | INTRAMUSCULAR | Status: AC
  Administered 2020-05-15: 20 mg via INTRAVENOUS
  Filled 2020-05-15: qty 2

## 2020-05-15 MED ORDER — FOLIC ACID 1 MG PO TABS
1.0000 mg | ORAL_TABLET | Freq: Every day | ORAL | Status: DC
  Administered 2020-05-15 – 2020-05-18 (×4): 1 mg via ORAL
  Filled 2020-05-15 (×4): qty 1

## 2020-05-15 NOTE — Progress Notes (Signed)
[facility] ([facility])  Hospital Liaison: RN note         Notified by TOC manager of patient/family request for [facility] Palliative services at home after discharge.                  [facility] Palliative team will follow up with patient after discharge.         Please call with any hospice or palliative related questions.         Thank you for this referral.         Mary Anne Robertson, RN, CCM  [facility] Hospital Liaison (listed on [facility] under [facility])    [phone]   

## 2020-05-15 NOTE — ED Notes (Signed)
Critical lab report on positive blood cultures given to Tracy Armstrong [facility]

## 2020-05-15 NOTE — Progress Notes (Signed)
In Progress  05/15/20 9:33 PM [place], Virgel Paling, RN US guided attempt to place midline. Patient too contracted to place line. Would recommend a tunneled PICC placed by IR or a more advanced line.

## 2020-05-15 NOTE — Progress Notes (Addendum)
PROGRESS NOTE                                                                                                                                                                                                             Patient Demographics:    Tracy Armstrong, is a 74 y.o. female, DOB - August 07, 1946, ZOX:[id number]  Admit date - 05/13/2020   Admitting Physician Vassie Loll, MD  Outpatient Primary MD for the patient is [name], MD  LOS - 2  Chief Complaint  Patient presents with   Failure To Thrive       Brief Narrative -  Aneita P Armstrong is a 74 y.o. female with medical history significant of multiple sclerosis, dementia, hypertension and bedbound; who presented to [facility] emergency department secondary to worsening left foot infection, decrease appetite and worsening confusion. Patient's daughter reports over the last month mom has been actively declining, eating less and with worsening drainage and appearance in her chronic left foot wound, other work-up showed possible osteomyelitis in the left foot and she was sent to [facility] for further care.   Subjective:   Patient in bed, appears comfortable, denies any headache, no fever, no chest pain or pressure, no shortness of breath , no abdominal pain. No focal weakness.    Assessment  & Plan :    1. Sepsis due to left foot cellulitis along with osteomyelitis chronic. On empiric IV antibiotics, has been hydrated and sepsis pathophysiology has resolved, orthopedics is suggested wound care and conservative treatment. Continue antibiotics  preliminary report from [facility] was communicated to the nursing staff that gram-positive cocci has been noted, will wait for final report for now TTE has been ordered. Extremely poor candidate for any major surgical intervention, wound care will be involved.  Recent Labs  Lab 05/13/20 1145 05/13/20 1851 05/14/20 0756 05/15/20 0750  WBC 22.0*  --  18.2* 17.2*  PLT 192   --  173 199  CRP  --   --  20.9* 18.2*  PROCALCITON  --   --  0.29  --   LATICACIDVEN 2.8* <0.3*  --   --      2. Chronic deconditioning, cachexia, multiple sclerosis. She is bedbound, severe contractures in all 4 extremities especially lower extremities, continue supportive care.  Long discussion with patient's daughters on 05/14/2020, DNR, continue medical treatment.  No heroics.  If significant decline then focus on full comfort.   3. Multiple unstageable decubitus ulcers and pressure  wounds present on admission. Kindly see nursing note. Wound care consulted.  4. History of dementia. Continue Aricept and supportive care. At risk for delirium. Minimize narcotics and benzodiazepines.  5. Hypertension. Blood pressure currently soft, 2 units packed RBC on 05/15/2020 and monitor.  6. Severe PCM and deconditioning. Nutritionist consult, protein supplementations, PT OT.  7. Severe chronic microcytic anemia worse with heme dilution due to IV fluids.  Signs of ongoing active bleeding, PPI, iron and folic acid orally, 2 units of packed RBC on 05/15/2020, monitor.  Not a candidate for invasive testing or procedures.   Pressure Injury 05/13/20 Buttocks Medial;Bilateral Unstageable - Full thickness tissue loss in which the base of the injury is covered by slough (yellow, tan, gray, green or brown) and/or eschar (tan, brown or black) in the wound bed. (Active)  05/13/20 1030  Location: Buttocks  Location Orientation: Medial;Bilateral  Staging: Unstageable - Full thickness tissue loss in which the base of the injury is covered by slough (yellow, tan, gray, green or brown) and/or eschar (tan, brown or black) in the wound bed.  Wound Description (Comments):   Present on Admission: Yes     Pressure Injury 05/13/20 Ankle Right Unstageable - Full thickness tissue loss in which the base of the injury is covered by slough (yellow, tan, gray, green or brown) and/or eschar (tan, brown or black) in the wound bed.  (Active)  05/13/20 1030  Location: Ankle  Location Orientation: Right  Staging: Unstageable - Full thickness tissue loss in which the base of the injury is covered by slough (yellow, tan, gray, green or brown) and/or eschar (tan, brown or black) in the wound bed.  Wound Description (Comments):   Present on Admission: Yes     Pressure Injury 05/13/20 Ankle Left Unstageable - Full thickness tissue loss in which the base of the injury is covered by slough (yellow, tan, gray, green or brown) and/or eschar (tan, brown or black) in the wound bed. (Active)  05/13/20 1030  Location: Ankle  Location Orientation: Left  Staging: Unstageable - Full thickness tissue loss in which the base of the injury is covered by slough (yellow, tan, gray, green or brown) and/or eschar (tan, brown or black) in the wound bed.  Wound Description (Comments):   Present on Admission: Yes     Pressure Injury 05/13/20 Knee Right Unstageable - Full thickness tissue loss in which the base of the injury is covered by slough (yellow, tan, gray, green or brown) and/or eschar (tan, brown or black) in the wound bed. (Active)  05/13/20 1030  Location: Knee  Location Orientation: Right  Staging: Unstageable - Full thickness tissue loss in which the base of the injury is covered by slough (yellow, tan, gray, green or brown) and/or eschar (tan, brown or black) in the wound bed.  Wound Description (Comments):   Present on Admission:     Condition -   Guarded  Family Communication  : daughter [name] - [phone] will be called on 05/15/2020 at 6pm, detailed prognosis discussion, DNR, full Med Rx, no heroics.  Updated again on 05/15/2020  Code Status : DNR now  Consults  :  Ortho, Pall Care  Procedures  :    TTE  MRI L.Foot -  1. Deep soft tissue ulceration along the medial aspect of the ankle with ulcer base extending to the medial cortex of the medial malleolus and talus. Associated signal changes within the peripheral  aspects of the medial malleolus and medial talus compatible with acute osteomyelitis.  2. Trace tibiotalar joint effusion with enhancing synovium concerning for septic arthritis. There appears to be air or gas within the tibiotalar joint, possibly extending from adjacent ulceration. 3. Trace talonavicular joint effusion with marrow edema and cortical irregularity along both sides of the joint suspicious for septic arthritis and osteomyelitis. 4. Similar findings at the calcaneocuboid joint also suspicious for septic arthritis and osteomyelitis. 5. Additional ulceration over the at the posterior hindfoot with acute osteomyelitis of the posterior calcaneus. 6. Marrow edema within the residual great toe distal phalanx which may reflect osteitis versus early osteomyelitis. 7. Extensive cellulitis with numerous foci of susceptibility tracking within the soft tissues of the lateral forefoot concerning for gas-forming infection. No drainable fluid collection. 8. Tendinous and ligamentous abnormalities, as above  PUD Prophylaxis : PPI  Disposition Plan  :    Status is: Inpatient  Remains inpatient appropriate because:IV treatments appropriate due to intensity of illness or inability to take PO and Inpatient level of care appropriate due to severity of illness   Dispo: The patient is from: Home              Anticipated d/c is to: Home              Anticipated d/c date is: 3 days              Patient currently is not medically stable to d/c.   DVT Prophylaxis  :   Heparin  Lab Results  Component Value Date   PLT 199 05/15/2020    Diet :  Diet Order            DIET DYS 3 Room service appropriate? Yes; Fluid consistency: Thin  Diet effective now                  Inpatient Medications Scheduled Meds:  (feeding supplement) PROSource Plus  30 mL Oral Daily   sodium chloride   Intravenous Once   docusate sodium  200 mg Oral BID   donepezil  10 mg Oral QHS   feeding supplement (ENSURE  ENLIVE)  237 mL Oral BID BM   ferrous sulfate  325 mg Oral BID WC   folic acid  1 mg Oral Daily   furosemide  20 mg Intravenous Once   heparin  5,000 Units Subcutaneous Q8H   mirtazapine  7.5 mg Oral QHS   pantoprazole  40 mg Oral Daily   Continuous Infusions:  piperacillin-tazobactam (ZOSYN)  IV 3.375 g (05/15/20 4332)   vancomycin Stopped (05/14/20 1800)   PRN Meds:.acetaminophen **OR** [DISCONTINUED] acetaminophen, [DISCONTINUED] ondansetron **OR** ondansetron (ZOFRAN) IV  Antibiotics  :   Anti-infectives (From admission, onward)   Start     Dose/Rate Route Frequency Ordered Stop   05/14/20 1400  vancomycin (VANCOREADY) IVPB 750 mg/150 mL     Discontinue     750 mg 150 mL/hr over 60 Minutes Intravenous Every 24 hours 05/13/20 1234     05/13/20 2200  piperacillin-tazobactam (ZOSYN) IVPB 3.375 g     Discontinue     3.375 g 12.5 mL/hr over 240 Minutes Intravenous Every 8 hours 05/13/20 1236     05/13/20 1130  piperacillin-tazobactam (ZOSYN) IVPB 3.375 g  Status:  Discontinued        3.375 g 100 mL/hr over 30 Minutes Intravenous  Once 05/13/20 1115 05/13/20 1751   05/13/20 1130  clindamycin (CLEOCIN) IVPB 600 mg  Status:  Discontinued        600 mg 100 mL/hr over 30 Minutes  Intravenous  Once 05/13/20 1123 05/13/20 1751   05/13/20 1100  vancomycin (VANCOREADY) IVPB 1500 mg/300 mL        1,500 mg 150 mL/hr over 120 Minutes Intravenous  Once 05/13/20 1041 05/13/20 1811   05/13/20 1045  vancomycin (VANCOCIN) IVPB 1000 mg/200 mL premix  Status:  Discontinued        1,000 mg 200 mL/hr over 60 Minutes Intravenous  Once 05/13/20 1034 05/13/20 1040   05/13/20 1045  cefTRIAXone (ROCEPHIN) 2 g in sodium chloride 0.9 % 100 mL IVPB  Status:  Discontinued        2 g 200 mL/hr over 30 Minutes Intravenous  Once 05/13/20 1034 05/13/20 1113          Objective:   Vitals:   05/14/20 1700 05/14/20 2023 05/15/20 0424 05/15/20 0750  BP:  95/60 96/65   Pulse: 96 91 86   Resp:  18 18     Temp: 97.9 F (36.6 C) 97.8 F (36.6 C) 97.7 F (36.5 C) (!) 97.2 F (36.2 C)  TempSrc: Axillary Oral Oral Axillary  SpO2: 100% 100% 100%   Weight:      Height:        SpO2: 100 %  Wt Readings from Last 3 Encounters:  05/13/20 63.2 kg  07/19/11 59.9 kg     Intake/Output Summary (Last 24 hours) at 05/15/2020 1007 Last data filed at 05/15/2020 0400 Gross per 24 hour  Intake 2109.61 ml  Output 700 ml  Net 1409.61 ml     Physical Exam  Awake Alert overall, minimally confused at times, extremely weak lower extremities from advanced MS, severely weak and cachectic, [place].AT,PERRAL Supple Neck,No JVD, No cervical lymphadenopathy appriciated.  Symmetrical Chest wall movement, Good air movement bilaterally, CTAB RRR,No Gallops, Rubs or new Murmurs, No Parasternal Heave +ve B.Sounds, Abd Soft, No tenderness, No organomegaly appriciated, No rebound - guarding or rigidity. Severe chronic lower extremity contractures, mild contractures in upper extremity right hand more than left, both feet under dressing    Pressure Injury 05/13/20 Buttocks Medial;Bilateral Unstageable - Full thickness tissue loss in which the base of the injury is covered by slough (yellow, tan, gray, green or brown) and/or eschar (tan, brown or black) in the wound bed. (Active)  05/13/20 1030  Location: Buttocks  Location Orientation: Medial;Bilateral  Staging: Unstageable - Full thickness tissue loss in which the base of the injury is covered by slough (yellow, tan, gray, green or brown) and/or eschar (tan, brown or black) in the wound bed.  Wound Description (Comments):   Present on Admission: Yes     Pressure Injury 05/13/20 Ankle Right Unstageable - Full thickness tissue loss in which the base of the injury is covered by slough (yellow, tan, gray, green or brown) and/or eschar (tan, brown or black) in the wound bed. (Active)  05/13/20 1030  Location: Ankle  Location Orientation: Right  Staging: Unstageable  - Full thickness tissue loss in which the base of the injury is covered by slough (yellow, tan, gray, green or brown) and/or eschar (tan, brown or black) in the wound bed.  Wound Description (Comments):   Present on Admission: Yes     Pressure Injury 05/13/20 Ankle Left Unstageable - Full thickness tissue loss in which the base of the injury is covered by slough (yellow, tan, gray, green or brown) and/or eschar (tan, brown or black) in the wound bed. (Active)  05/13/20 1030  Location: Ankle  Location Orientation: Left  Staging: Unstageable - Full thickness  tissue loss in which the base of the injury is covered by slough (yellow, tan, gray, green or brown) and/or eschar (tan, brown or black) in the wound bed.  Wound Description (Comments):   Present on Admission: Yes     Pressure Injury 05/13/20 Knee Right Unstageable - Full thickness tissue loss in which the base of the injury is covered by slough (yellow, tan, gray, green or brown) and/or eschar (tan, brown or black) in the wound bed. (Active)  05/13/20 1030  Location: Knee  Location Orientation: Right  Staging: Unstageable - Full thickness tissue loss in which the base of the injury is covered by slough (yellow, tan, gray, green or brown) and/or eschar (tan, brown or black) in the wound bed.  Wound Description (Comments):   Present on Admission:      Data Review:    Recent Labs  Lab 05/13/20 1145 05/14/20 0756 05/15/20 0750  WBC 22.0* 18.2* 17.2*  HGB 8.4* 7.2* 6.2*  HCT 29.9* 24.1* 21.5*  PLT 192 173 199  MCV 88.5 81.1 83.0  MCH 24.9* 24.2* 23.9*  MCHC 28.1* 29.9* 28.8*  RDW 17.5* 17.4* 17.6*  LYMPHSABS 1.6  --  2.6  MONOABS 1.0  --  0.9  EOSABS 0.0  --  0.1  BASOSABS 0.0  --  0.0    Recent Labs  Lab 05/13/20 1145 05/13/20 1849 05/14/20 0756 05/15/20 0750  NA 135  --  138 140  K 3.9  --  3.5 3.5  CL 103  --  107 108  CO2 21*  --  23 23  GLUCOSE 122*  --  81 77  BUN 20  --  16 12  CREATININE 0.55  --  0.55  0.50  CALCIUM 8.3*  --  8.1* 7.9*  AST 13*  --   --  11*  ALT 9  --   --  8  ALKPHOS 135*  --   --  94  BILITOT 0.5  --   --  0.2*  ALBUMIN 2.0*  --   --  1.3*  MG  --  1.9 1.8 1.8  CRP  --   --  20.9* 18.2*  PROCALCITON  --   --  0.29  --   INR 1.3*  --   --   --   TSH  --  0.803  --   --   BNP  --   --  812.7* 149.8*    Recent Labs  Lab 05/13/20 1059 05/14/20 0756 05/15/20 0750  CRP  --  20.9* 18.2*  BNP  --  812.7* 149.8*  PROCALCITON  --  0.29  --   SARSCOV2NAA NEGATIVE  --   --     ------------------------------------------------------------------------------------------------------------------ No results for input(s): CHOL, HDL, LDLCALC, TRIG, CHOLHDL, LDLDIRECT in the last 72 hours.  No results found for: HGBA1C ------------------------------------------------------------------------------------------------------------------ Recent Labs    05/13/20 1849  TSH 0.803   ------------------------------------------------------------------------------------------------------------------ Recent Labs    05/13/20 1849 05/14/20 1409  VITAMINB12 639 633  FOLATE  --  13.3  FERRITIN  --  921*  TIBC  --  102*  IRON  --  13*  RETICCTPCT  --  1.3    Coagulation profile Recent Labs  Lab 05/13/20 1145  INR 1.3*    No results for input(s): DDIMER in the last 72 hours.  Cardiac Enzymes No results for input(s): CKMB, TROPONINI, MYOGLOBIN in the last 168 hours.  Invalid input(s): CK ------------------------------------------------------------------------------------------------------------------    Component Value Date/Time  BNP 149.8 (H) 05/15/2020 0750    Micro Results Recent Results (from the past 240 hour(s))  SARS Coronavirus 2 by RT PCR (hospital order, performed in [facility] hospital lab) Nasopharyngeal Nasopharyngeal Swab     Status: None   Collection Time: 05/13/20 10:59 AM   Specimen: Nasopharyngeal Swab  Result Value Ref Range Status   SARS  Coronavirus 2 NEGATIVE NEGATIVE Final    Comment: (NOTE) SARS-CoV-2 target nucleic acids are NOT DETECTED.  The SARS-CoV-2 RNA is generally detectable in upper and lower respiratory specimens during the acute phase of infection. The lowest concentration of SARS-CoV-2 viral copies this assay can detect is 250 copies / mL. A negative result does not preclude SARS-CoV-2 infection and should not be used as the sole basis for treatment or other patient management decisions.  A negative result may occur with improper specimen collection / handling, submission of specimen other than nasopharyngeal swab, presence of viral mutation(s) within the areas targeted by this assay, and inadequate number of viral copies (<250 copies / mL). A negative result must be combined with clinical observations, patient history, and epidemiological information.  Fact Sheet for Patients:   BoilerBrush.com.cy  Fact Sheet for Healthcare Providers: https://pope.com/  This test is not yet approved or  cleared by the [place] FDA and has been authorized for detection and/or diagnosis of SARS-CoV-2 by FDA under an Emergency Use Authorization (EUA).  This EUA will remain in effect (meaning this test can be used) for the duration of the COVID-19 declaration under Section 564(b)(1) of the Act, 21 U.S.C. section 360bbb-3(b)(1), unless the authorization is terminated or revoked sooner.  Performed at [facility], [address] [place], [place] [address]   Blood Culture (routine x 2)     Status: None (Preliminary result)   Collection Time: 05/13/20 11:45 AM   Specimen: BLOOD  Result Value Ref Range Status   Specimen Description BLOOD  Final   Special Requests NONE  Final   Culture  Setup Time   Final    GRAM POSITIVE COCCI AEROBIC BOTTLE ONLY Gram Stain Report Called to,Read Back By and Verified With: MICHELLE MOORE,RN  05/15/2020 KAY    Culture   Final     NO GROWTH < 24 HOURS Performed at [facility], [address], [place], [place] [address]    Report Status PENDING  Incomplete  Blood Culture (routine x 2)     Status: None (Preliminary result)   Collection Time: 05/13/20 11:45 AM   Specimen: BLOOD  Result Value Ref Range Status   Specimen Description BLOOD RIGHT ANTECUBITAL  Final   Special Requests   Final    BOTTLES DRAWN AEROBIC AND ANAEROBIC Blood Culture adequate volume   Culture   Final    NO GROWTH < 24 HOURS Performed at [facility], [address] [place], [place] [address]    Report Status PENDING  Incomplete    Radiology Reports DG Ankle 2 Views Right  Result Date: 05/13/2020 CLINICAL DATA:  Pressure wound. EXAM: RIGHT ANKLE - 2 VIEW COMPARISON:  No recent prior. FINDINGS: Large soft tissue wound noted over the medial aspect of the right ankle. No radiopaque foreign body. No underlying bony erosion. No evidence of fracture or dislocation. Diffuse osteopenia and degenerative change. IMPRESSION: 1. Large soft tissue wound noted over the medial aspect of the right ankle. No radiopaque foreign body. No underlying bony erosion. 2. No acute bony abnormality. Diffuse osteopenia and degenerative change. Electronically Signed   By: Maisie Fus  Register   On: 05/13/2020 12:33   MR FOOT LEFT W WO CONTRAST  Result Date: 05/13/2020 CLINICAL DATA:  Nonhealing pressure all ulcers of the left foot and ankle EXAM: MRI OF THE LEFT FOREFOOT WITHOUT AND WITH CONTRAST; MRI OF THE LEFT ANKLE WITHOUT AND WITH CONTRAST TECHNIQUE: Multiplanar, multisequence MR imaging of the left foot and ankle was performed both before and after administration of intravenous contrast. CONTRAST:  6mL GADAVIST GADOBUTROL 1 MMOL/ML IV SOLN COMPARISON:  X-ray 05/13/2020 FINDINGS: Bones: There is a deep soft tissue ulceration along the medial aspect of the ankle with ulcer base closely approximating the medial cortex of the medial malleolus and talus. Associated bone marrow edema  and enhancement within the peripheral aspect of the medial malleolus and medial talus with loss of cortical definition and low T1 bone marrow signal compatible with acute osteomyelitis (series 5 and 6, image 10). Susceptibility within the tibiotalar joint space suggesting air tracking intra-articularly. Trace tibiotalar joint effusion with enhancing synovium concerning for septic arthritis. Trace talonavicular joint effusion with marrow edema and cortical irregularity along both sides of the joint (series 8 and 9, image 12). Findings are suspicious for septic arthritis and osteomyelitis. Similar findings are present at the calcaneocuboid joint with small joint effusion and associated subchondral marrow edema (series 9, image 17). Marrow edema within the residual great toe distal phalanx (series 9, image 10) raises suspicion for osteomyelitis. There is evidence of previous resection of the distal tuft. Additional ulceration over the posterior calcaneus with marrow edema and low T1 signal along the peripheral margin of the posterior calcaneus suspicious for osteomyelitis (series 1 and 2, image 11). Tendons: The posterior tibialis and flexor digitorum longus tendons extend into the area of ulceration at the lateral ankle and are not well delineated, and may be torn. Flexor hallucis longus tendon intact. Tendinosis of the distal Achilles tendon with poor delineation of the distal insertion at site of likely pressure ulceration. Peroneal tendons appear intact. Ligaments: LisFranc ligament intact. Deltoid and spring ligament complex are poorly evaluated at site of ulceration, likely torn. Lateral ankle tendons grossly intact. Soft tissues: Deep soft tissue ulceration at the lateral ankle, as described above. Extensive surrounding soft tissue edema and enhancement compatible with cellulitis. No drainable fluid collection. Extensive pressure ulceration at the posterior hindfoot. There are extensive foci of susceptibility  tracking within the soft tissues of the lateral forefoot (series 8, images 22-23). Additional areas of more superficial ulceration along the plantar aspect of the midfoot and forefoot with surrounding cellulitis. IMPRESSION: 1. Deep soft tissue ulceration along the medial aspect of the ankle with ulcer base extending to the medial cortex of the medial malleolus and talus. Associated signal changes within the peripheral aspects of the medial malleolus and medial talus compatible with acute osteomyelitis. 2. Trace tibiotalar joint effusion with enhancing synovium concerning for septic arthritis. There appears to be air or gas within the tibiotalar joint, possibly extending from adjacent ulceration. 3. Trace talonavicular joint effusion with marrow edema and cortical irregularity along both sides of the joint suspicious for septic arthritis and osteomyelitis. 4. Similar findings at the calcaneocuboid joint also suspicious for septic arthritis and osteomyelitis. 5. Additional ulceration over the at the posterior hindfoot with acute osteomyelitis of the posterior calcaneus. 6. Marrow edema within the residual great toe distal phalanx which may reflect osteitis versus early osteomyelitis. 7. Extensive cellulitis with numerous foci of susceptibility tracking within the soft tissues of the lateral forefoot concerning for gas-forming infection. No drainable fluid  collection. 8. Tendinous and ligamentous abnormalities, as above. These results will be called to the ordering clinician or representative by the Radiologist Assistant, and communication documented in the PACS or [employer]. Electronically Signed   By: Duanne Guess D.O.   On: 05/13/2020 15:56   MR ANKLE LEFT W WO CONTRAST  Result Date: 05/13/2020 CLINICAL DATA:  Nonhealing pressure all ulcers of the left foot and ankle EXAM: MRI OF THE LEFT FOREFOOT WITHOUT AND WITH CONTRAST; MRI OF THE LEFT ANKLE WITHOUT AND WITH CONTRAST TECHNIQUE: Multiplanar,  multisequence MR imaging of the left foot and ankle was performed both before and after administration of intravenous contrast. CONTRAST:  6mL GADAVIST GADOBUTROL 1 MMOL/ML IV SOLN COMPARISON:  X-ray 05/13/2020 FINDINGS: Bones: There is a deep soft tissue ulceration along the medial aspect of the ankle with ulcer base closely approximating the medial cortex of the medial malleolus and talus. Associated bone marrow edema and enhancement within the peripheral aspect of the medial malleolus and medial talus with loss of cortical definition and low T1 bone marrow signal compatible with acute osteomyelitis (series 5 and 6, image 10). Susceptibility within the tibiotalar joint space suggesting air tracking intra-articularly. Trace tibiotalar joint effusion with enhancing synovium concerning for septic arthritis. Trace talonavicular joint effusion with marrow edema and cortical irregularity along both sides of the joint (series 8 and 9, image 12). Findings are suspicious for septic arthritis and osteomyelitis. Similar findings are present at the calcaneocuboid joint with small joint effusion and associated subchondral marrow edema (series 9, image 17). Marrow edema within the residual great toe distal phalanx (series 9, image 10) raises suspicion for osteomyelitis. There is evidence of previous resection of the distal tuft. Additional ulceration over the posterior calcaneus with marrow edema and low T1 signal along the peripheral margin of the posterior calcaneus suspicious for osteomyelitis (series 1 and 2, image 11). Tendons: The posterior tibialis and flexor digitorum longus tendons extend into the area of ulceration at the lateral ankle and are not well delineated, and may be torn. Flexor hallucis longus tendon intact. Tendinosis of the distal Achilles tendon with poor delineation of the distal insertion at site of likely pressure ulceration. Peroneal tendons appear intact. Ligaments: LisFranc ligament intact. Deltoid  and spring ligament complex are poorly evaluated at site of ulceration, likely torn. Lateral ankle tendons grossly intact. Soft tissues: Deep soft tissue ulceration at the lateral ankle, as described above. Extensive surrounding soft tissue edema and enhancement compatible with cellulitis. No drainable fluid collection. Extensive pressure ulceration at the posterior hindfoot. There are extensive foci of susceptibility tracking within the soft tissues of the lateral forefoot (series 8, images 22-23). Additional areas of more superficial ulceration along the plantar aspect of the midfoot and forefoot with surrounding cellulitis. IMPRESSION: 1. Deep soft tissue ulceration along the medial aspect of the ankle with ulcer base extending to the medial cortex of the medial malleolus and talus. Associated signal changes within the peripheral aspects of the medial malleolus and medial talus compatible with acute osteomyelitis. 2. Trace tibiotalar joint effusion with enhancing synovium concerning for septic arthritis. There appears to be air or gas within the tibiotalar joint, possibly extending from adjacent ulceration. 3. Trace talonavicular joint effusion with marrow edema and cortical irregularity along both sides of the joint suspicious for septic arthritis and osteomyelitis. 4. Similar findings at the calcaneocuboid joint also suspicious for septic arthritis and osteomyelitis. 5. Additional ulceration over the at the posterior hindfoot with acute osteomyelitis of the posterior calcaneus. 6.  Marrow edema within the residual great toe distal phalanx which may reflect osteitis versus early osteomyelitis. 7. Extensive cellulitis with numerous foci of susceptibility tracking within the soft tissues of the lateral forefoot concerning for gas-forming infection. No drainable fluid collection. 8. Tendinous and ligamentous abnormalities, as above. These results will be called to the ordering clinician or representative by the  Radiologist Assistant, and communication documented in the PACS or [employer]. Electronically Signed   By: Duanne Guess D.O.   On: 05/13/2020 15:56   DG Chest Port 1 View  Result Date: 05/13/2020 CLINICAL DATA:  Sepsis EXAM: PORTABLE CHEST 1 VIEW COMPARISON:  Portable exam 1041 hours without priors for comparison FINDINGS: Normal heart size, mediastinal contours, and pulmonary vascularity. Lungs clear. No pulmonary infiltrate, pleural effusion or pneumothorax. Bones demineralized. IMPRESSION: No acute abnormalities. Electronically Signed   By: Ulyses Southward M.D.   On: 05/13/2020 11:05   DG Foot Complete Left  Result Date: 05/13/2020 CLINICAL DATA:  Multiple nonhealing pressure ulcers, sepsis EXAM: LEFT FOOT - COMPLETE 4 VIEW COMPARISON:  None FINDINGS: Nonstandard positioning due to patient condition. Marked osseous demineralization. Diffuse soft tissue swelling LEFT foot with foci of soft tissue gas at the plantar aspect of the foot at the level of the fourth and fifth metatarsals. This could be due to open wound or infection by a gas-forming organism. 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 Advanced degenerative changes at the talonavicular joint with superior navicular subluxation, could represent inflammatory arthropathy or septic arthritis. Probable ankle joint effusion. Additional degenerative changes at the naviculocuneiform joint and at the calcaneocuboid joint. No acute fracture, dislocation, or additional bone destruction. Lucency projecting over ankle joint on lateral view likely represents an additional ulcer though this is less well localized due to nonstandard positioning. IMPRESSION: Soft tissue gas at plantar aspect of lateral LEFT foot at the level of the fourth and fifth metatarsals either due to open wound or infection by a gas-forming organism. Osseous demineralization with scattered degenerative changes as well as destructive changes and subluxation at  the talonavicular joint, could represent inflammatory arthropathy but is concerning for septic arthritis. Suspected ankle joint effusion. Findings called to Harlene Salts PA on 05/13/2020 at 1110 hours. Electronically Signed   By: Ulyses Southward M.D.   On: 05/13/2020 11:12    Time Spent in minutes  30   Susa Raring M.D on 05/15/2020 at 10:07 AM  To page go to [email or web address] - password [facility]

## 2020-05-15 NOTE — Progress Notes (Addendum)
Palliative Medicine Inpatient Follow Up Note   Reason for consult:  Goals of Care and Advanced Directives  HPI:  Per intake H&P --> Tracy Armstrong a 74 y.o.femalewith medical history significant ofmultiple sclerosis, dementia, hypertension and bedbound; who presented to emergency departmentsecondary to worsening left foot infection, decrease appetite and worsening confusion. Patient's daughter reports over the last month mom has been actively declining, eating less and with worsening drainage and appearance in her chronic left foot wound. No fever, no chills, no nausea, no vomiting, no CP, no diarrhea, no hematuria, no dysuria, no melena or any other complaints.   Palliative care was asked to get involved to aid in goals of care conversations.  Today's Discussion (05/15/2020): Chart reviewed. I met with patients RN at bedside, she shared that the patient has had a very good appetite since admission though she take some time to help with feeding. Patient herself was alert this morning asking me where her breakfast was.   Last evening patient had an e/o hypotension likely in the setting of bacteremia and the primary MD called her daughter. The patients code status was changed to DNR with no heroic interventions.   Patients daughter is still interested in pursuing amputation if this is offered. I expressed concerns that this will likely not be the only amputation her mother has to endure. Tracy Armstrong stated that she is "not ready to give up". I shared with her that I understand this and truly our goal is to respect the wishes of her mother if this is what she feels she would have wanted. We discussed her many medical conditions again. Tracy Armstrong said that this is not the first time she has "come back from the dead." She expressed that she looks forward to getting her home though she "wants home health to do their job". I listened to her discuss this for sometime. I shared with her that I would  inform TOC of her concerns moving forward. I asked if she would be interested in OP Palliative care following up which she was in agreement with.   Discussed the importance of continued conversation with family and their  medical providers regarding overall plan of care and treatment options, ensuring decisions are within the context of the patients values and GOCs.  Questions and concerns addressed   Vital Signs Vitals:   05/15/20 0424 05/15/20 0750  BP: 96/65   Pulse: 86   Resp: 18   Temp: 97.7 F (36.5 C) (!) 97.2 F (36.2 C)  SpO2: 100%     Intake/Output Summary (Last 24 hours) at 05/15/2020 0931 Last data filed at 05/15/2020 0400 Gross per 24 hour  Intake 2384.61 ml  Output 700 ml  Net 1684.61 ml   Last Weight  Most recent update: 05/13/2020 10:29 PM   Weight  63.2 kg (139 lb 5.3 oz)           Gen:  Alert elderly AA cachectic F in NAD HEENT: moist mucous membranes CV: Regular rate and rhythm, no murmurs rubs or gallops PULM: clear to auscultation bilaterally  ABD: soft/nontender  Neuro: Alert and oriented to self  SUMMARY OF RECOMMENDATIONS DNAR / DNI / NO heroic interventions -  Continue current medical treatment    Ongoing GOC conversations  TOC -> OP Palliative care  TOC -> Daughter concerned wound nurses are not as attentive as she would like  Chaplain support for advanced directive completion and support daughter has complicated grief from the loss of her father.  Time Spent: 35 Greater than 50% of the time was spent in counseling and coordination of care ______________________________________________________________________________________ [place] Team Team Cell Phone: [phone] Please utilize secure chat with additional questions, if there is no response within 30 minutes please call the above phone number  Palliative Medicine Team providers are available by phone from 7am to 7pm daily and can be reached  through the team cell phone.  Should this patient require assistance outside of these hours, please call the patient's attending physician.

## 2020-05-16 ENCOUNTER — Inpatient Hospital Stay (HOSPITAL_COMMUNITY): Payer: Medicare HMO

## 2020-05-16 DIAGNOSIS — A419 Sepsis, unspecified organism: Principal | ICD-10-CM

## 2020-05-16 DIAGNOSIS — G35 Multiple sclerosis: Secondary | ICD-10-CM

## 2020-05-16 DIAGNOSIS — I5033 Acute on chronic diastolic (congestive) heart failure: Secondary | ICD-10-CM

## 2020-05-16 DIAGNOSIS — M86272 Subacute osteomyelitis, left ankle and foot: Secondary | ICD-10-CM

## 2020-05-16 DIAGNOSIS — R6520 Severe sepsis without septic shock: Secondary | ICD-10-CM

## 2020-05-16 DIAGNOSIS — G9340 Encephalopathy, unspecified: Secondary | ICD-10-CM

## 2020-05-16 LAB — TYPE AND SCREEN
ABO/RH(D): B POS
Antibody Screen: NEGATIVE
Unit division: 0
Unit division: 0

## 2020-05-16 LAB — BRAIN NATRIURETIC PEPTIDE: B Natriuretic Peptide: 225.9 pg/mL — ABNORMAL HIGH (ref 0.0–100.0)

## 2020-05-16 LAB — C-REACTIVE PROTEIN: CRP: 19.1 mg/dL — ABNORMAL HIGH (ref <1.0)

## 2020-05-16 LAB — ECHOCARDIOGRAM COMPLETE
Height: 66 in
Weight: 2229.29 oz

## 2020-05-16 LAB — BPAM RBC
Blood Product Expiration Date: 202108112359
Blood Product Expiration Date: 202108122359
ISSUE DATE / TIME: 202107111143
ISSUE DATE / TIME: 202107112234
Unit Type and Rh: 7300
Unit Type and Rh: 7300

## 2020-05-16 LAB — COMPREHENSIVE METABOLIC PANEL
ALT: 9 U/L (ref 0–44)
AST: 10 U/L — ABNORMAL LOW (ref 15–41)
Albumin: 1.3 g/dL — ABNORMAL LOW (ref 3.5–5.0)
Alkaline Phosphatase: 86 U/L (ref 38–126)
Anion gap: 12 (ref 5–15)
BUN: 14 mg/dL (ref 8–23)
CO2: 22 mmol/L (ref 22–32)
Calcium: 7.6 mg/dL — ABNORMAL LOW (ref 8.9–10.3)
Chloride: 108 mmol/L (ref 98–111)
Creatinine, Ser: 0.58 mg/dL (ref 0.44–1.00)
GFR calc Af Amer: >60 mL/min (ref >60)
GFR calc non Af Amer: >60 mL/min (ref >60)
Glucose, Bld: 84 mg/dL (ref 70–99)
Potassium: 3.1 mmol/L — ABNORMAL LOW (ref 3.5–5.1)
Sodium: 142 mmol/L (ref 135–145)
Total Bilirubin: 0.9 mg/dL (ref 0.3–1.2)
Total Protein: 5.7 g/dL — ABNORMAL LOW (ref 6.5–8.1)

## 2020-05-16 LAB — CBC WITH DIFFERENTIAL/PLATELET
Abs Immature Granulocytes: 0.11 10*3/uL — ABNORMAL HIGH (ref 0.00–0.07)
Basophils Absolute: 0 10*3/uL (ref 0.0–0.1)
Basophils Relative: 0 %
Eosinophils Absolute: 0.1 10*3/uL (ref 0.0–0.5)
Eosinophils Relative: 1 %
HCT: 31.4 % — ABNORMAL LOW (ref 36.0–46.0)
Hemoglobin: 10 g/dL — ABNORMAL LOW (ref 12.0–15.0)
Immature Granulocytes: 1 %
Lymphocytes Relative: 16 %
Lymphs Abs: 1.8 10*3/uL (ref 0.7–4.0)
MCH: 26.2 pg (ref 26.0–34.0)
MCHC: 31.8 g/dL (ref 30.0–36.0)
MCV: 82.2 fL (ref 80.0–100.0)
Monocytes Absolute: 0.7 10*3/uL (ref 0.1–1.0)
Monocytes Relative: 6 %
Neutro Abs: 9.1 10*3/uL — ABNORMAL HIGH (ref 1.7–7.7)
Neutrophils Relative %: 76 %
Platelets: 165 10*3/uL (ref 150–400)
RBC: 3.82 MIL/uL — ABNORMAL LOW (ref 3.87–5.11)
RDW: 15.9 % — ABNORMAL HIGH (ref 11.5–15.5)
WBC: 11.7 10*3/uL — ABNORMAL HIGH (ref 4.0–10.5)
nRBC: 0 % (ref 0.0–0.2)

## 2020-05-16 LAB — PROCALCITONIN: Procalcitonin: <0.1 ng/mL

## 2020-05-16 LAB — VANCOMYCIN, TROUGH: Vancomycin Tr: 18 ug/mL (ref 15–20)

## 2020-05-16 LAB — MAGNESIUM: Magnesium: 1.8 mg/dL (ref 1.7–2.4)

## 2020-05-16 MED ORDER — SODIUM CHLORIDE 0.9 % IV SOLN
2.0000 g | Freq: Two times a day (BID) | INTRAVENOUS | Status: DC
  Administered 2020-05-16 – 2020-05-18 (×4): 2 g via INTRAVENOUS
  Filled 2020-05-16 (×5): qty 2

## 2020-05-16 MED ORDER — POTASSIUM CHLORIDE 20 MEQ PO PACK
40.0000 meq | PACK | Freq: Once | ORAL | Status: AC
  Administered 2020-05-16: 40 meq via ORAL
  Filled 2020-05-16: qty 2

## 2020-05-16 NOTE — Progress Notes (Signed)
The chaplain responded to a consult for Advanced Directive. The chaplain provided the patient with the AD brochure. The chaplain will follow-up tomorrow morning to sign and notarize the paperwork.  Lavone Neri Chaplain Resident For questions concerning this note please contact me by pager [phone]

## 2020-05-16 NOTE — Progress Notes (Signed)
Patient ID: Tracy Armstrong, female   DOB: 1946/10/01, 74 y.o.   MRN: [id number] I spoke with patient's daughter this morning.  Discussed the 2 options of either bilateral above-the-knee amputation or long-term IV antibiotics.  I feel the best option for long-term survival would be to proceed with the bilateral above-the-knee amputations.  The daughter states that she would like to speak with her family members and will let me know tomorrow if they wish to proceed with surgery.  Would plan for bilateral above-the-knee amputation surgery either Wednesday or Friday.

## 2020-05-16 NOTE — Progress Notes (Addendum)
Pharmacy Antibiotic Note  Tracy Armstrong is a 74 y.o. female admitted on 05/13/2020 with multiple non-healing pressure wounds.  Pharmacy was initially consulted for vancomycin and Zosyn dosing.  Asked to change today to vancomycin and cefepime.    Pt has MS and paraplegia likely leading to an overestimation of her renal function.  SCr 0.5, CrCl 59.5 ml/min (renal function stable); WBC down to 11.7, afebrile  Today is total abx day #4  Vancomycin trough level (pre-steady-state) drawn today prior to third dose of vancomycin 750 mg IV Q 24 hr regimen was 18 mg/L, which is within the trough goal range of 15-20 mg/L.  Plan: Continue vancomycin 750 mg IV Q 24 hrs Check steady-state vancomycin level if pt continues on vancomycin Continue cefepime 2 gm IV Q 12 hrs Monitor WBC, temp, clinical improvement, cultures, renal function, vancomycin levels as indicated F/U plans for bilateral AKA vs long term IV abx  Height: 5\' 6"  (167.6 cm) Weight: 63.2 kg (139 lb 5.3 oz) IBW/kg (Calculated) : 59.3  Temp (24hrs), Avg:98.1 F (36.7 C), Min:97.5 F (36.4 C), Max:99.5 F (37.5 C)  Recent Labs  Lab 05/13/20 1145 05/13/20 1851 05/14/20 0756 05/15/20 0750 05/16/20 0659 05/16/20 1554  WBC 22.0*  --  18.2* 17.2* 11.7*  --   CREATININE 0.55  --  0.55 0.50 0.58  --   LATICACIDVEN 2.8* <0.3*  --   --   --   --   VANCOTROUGH  --   --   --   --   --  18    Estimated Creatinine Clearance: 59.5 mL/min (by C-G formula based on SCr of 0.58 mg/dL).    No Known Allergies  Antimicrobials this admission: Vancomycin 7/9 >>  Zosyn 7/9>> 7/12 Cefepime 7/12 >>  Dose adjustments this admission: N/A  Microbiology results: 7/9 COVID: negative 7/9 HIV: negative 7/9  BC X 2:   1/4 coag neg staph (methicillin-susceptible) 7/11 MRSA PCR: negative  Thank you for allowing pharmacy to be a part of this patient's care.  9/11, PharmD, BCPS, [facility] Clinical Pharmacist 05/16/2020 4:32 PM

## 2020-05-16 NOTE — Progress Notes (Signed)
Occupational Therapy Evaluation Patient Details Name: Tracy Armstrong MRN: [id number] DOB: 1946-09-14 Today's Date: 05/16/2020    History of Present Illness 74 y.o. female admitted on 05/13/20 for decreased apetite, confusion and worsening left foot wound.  Pt dx with sepsis due to left foot cellulitis along with osteomyelitis.  Ortho consulted, wound care RN consulted.  Pt also with chronic deconditioning, cachexia, MS, severe anemia.  Pt with significant PMH of MS, dementia.    Clinical Impression   Patient lives at home with daughter who provides total assist with ADLs.  Patient is very contracted, both UE and LE (detailed UE below).  Patient able to wash face with max assist at bed level, but total for all other ADLs at this time.  She is able to follow simple commands and respond to simple questions.  Evaluated for resting hand splints but is not appropriate at this time due to hard end feels of joints and patient is able to extend fingers actively.  Would benefit from education on hand stretches to practice.  Will continue to follow with OT acutely to address the deficits listed below.      Follow Up Recommendations  Supervision/Assistance - 24 hour    Equipment Recommendations  None recommended by OT (Has all equipment according to chart)    Recommendations for Other Services       Precautions / Restrictions Precautions Precautions: Other (comment) Precaution Comments: multiple wounds  Restrictions Weight Bearing Restrictions: No      Mobility Bed Mobility Overal bed mobility: Needs Assistance             General bed mobility comments: did not attempt this session.  PT requiring total x2 assist yesterday.  Transfers                      Balance                                           ADL either performed or assessed with clinical judgement   ADL Overall ADL's : Needs assistance/impaired Eating/Feeding: Total assistance;Bed level    Grooming: Bed level;Wash/dry face;Maximal assistance Grooming Details (indicate cue type and reason): Able to wash some of face with wash cloth when cloth placed in L hand.                              Functional mobility during ADLs: Total assistance;+2 for physical assistance General ADL Comments: Patient is total assist for most ADLs (with exception of basic grooming tasks at bed level in which she is max assist).  Total assist is her baseline     Vision         Perception     Praxis      Pertinent Vitals/Pain Pain Assessment: Faces Faces Pain Scale: Hurts a little bit Pain Location: with R shoulder PROm Pain Descriptors / Indicators: Grimacing Pain Intervention(s): Limited activity within patient's tolerance;Repositioned     Hand Dominance Right   Extremity/Trunk Assessment Upper Extremity Assessment Upper Extremity Assessment: Generalized weakness;RUE deficits/detail;LUE deficits/detail RUE Deficits / Details: Patient very contracted and joint ROM limited by hard end feels.  Strength 2/5, able to move within the ROM she has. Shoulder flexion ~20 degrees. Elbow extension to 90 degrees. Wrist flexion to ~30 degrees and extension to zero.  Digits MCPs  cannot flex past 20, PIPs past 45.  LUE Deficits / Details: Patient very contracted and joint ROM limited by hard end feels.  Strength 2/5, able to move within the ROM she has.  Shoulder flex to ~30 degrees.  Elbow extension to 125 degrees.  Wrist flexion WFL and extension to zero.  Digits 4 and 5 contracted in flexion, but digits 1-3 cannot flex past 20 degrees (MCP) and 80 degrees (PIP)   Lower Extremity Assessment Lower Extremity Assessment: Defer to PT evaluation       Communication Communication Communication: Expressive difficulties (Speech mumbled. Not wearing dentures. Difficult to [place])   Cognition Arousal/Alertness: Awake/alert Behavior During Therapy: WFL for tasks assessed/performed Overall  Cognitive Status: History of cognitive impairments - at baseline                                 General Comments: Baseline of dementia.  Patient able to answer simple questions and follow simple commands   General Comments  BP 98/61    Exercises Exercises: General Upper Extremity General Exercises - Upper Extremity Shoulder Flexion: PROM;Both;5 reps Elbow Extension: PROM;Both;5 reps Digit Composite Flexion: PROM;Both;5 reps   Shoulder Instructions      Home Living Family/patient expects to be discharged to:: Private residence Living Arrangements: Children Available Help at Discharge: Family;Available 24 hours/day (pt has 3 daughters, one lives with her and is her caretaker) Type of Home: House Home Access: Ramped entrance     Home Layout: One level               Home Equipment: Wheelchair - manual;Hospital bed          Prior Functioning/Environment Level of Independence: Needs assistance  Gait / Transfers Assistance Needed: total assist for bed mobility, transfer to WC ADL's / Homemaking Assistance Needed: used to self feed, total care for bathing, dressing, toilets in her depends.             OT Problem List: Decreased strength;Decreased range of motion;Impaired balance (sitting and/or standing);Decreased coordination;Decreased cognition;Impaired UE functional use;Impaired tone      OT Treatment/Interventions: Self-care/ADL training;Therapeutic exercise;Therapeutic activities;Patient/family education;Manual therapy    OT Goals(Current goals can be found in the care plan section) Acute Rehab OT Goals Patient Stated Goal: did not state OT Goal Formulation: Patient unable to participate in goal setting Time For Goal Achievement: 05/30/20 Potential to Achieve Goals: Fair  OT Frequency: Min 2X/week   Barriers to D/C: Decreased caregiver support          Co-evaluation              AM-PAC OT "6 Clicks" Daily Activity     Outcome  Measure Help from another person eating meals?: Total Help from another person taking care of personal grooming?: A Lot Help from another person toileting, which includes using toliet, bedpan, or urinal?: Total Help from another person bathing (including washing, rinsing, drying)?: Total Help from another person to put on and taking off regular upper body clothing?: Total Help from another person to put on and taking off regular lower body clothing?: Total 6 Click Score: 7   End of Session Nurse Communication: Mobility status  Activity Tolerance: Patient tolerated treatment well Patient left: in bed;with call bell/phone within reach;with bed alarm set  OT Visit Diagnosis: Other abnormalities of gait and mobility (R26.89);Muscle weakness (generalized) (M62.81);Other symptoms and signs involving cognitive function;Feeding difficulties (R63.3)  Time: 1010-1025 OT Time Calculation (min): 15 min Charges:  OT General Charges $OT Visit: 1 Visit OT Evaluation $OT Eval Moderate Complexity: 1 [address], OTR/L   Adella Hare 05/16/2020, 1:37 PM

## 2020-05-16 NOTE — Progress Notes (Signed)
  Echocardiogram 2D Echocardiogram has been performed.  Masiyah Jorstad G Jahaziel Francois 05/16/2020, 10:06 AM

## 2020-05-16 NOTE — Progress Notes (Signed)
PROGRESS NOTE                                                                                                                                                                                                             Patient Demographics:    Tracy Armstrong, is a 74 y.o. female, DOB - [birth year], TYO:[id number]  Admit date - 05/13/2020   Admitting Physician Vassie Loll, MD  Outpatient Primary MD for the patient is [name], MD  LOS - 3  Chief Complaint  Patient presents with   Failure To Thrive       Brief Narrative -  Tracy Armstrong is a 74 y.o. female with medical history significant of multiple sclerosis, dementia, hypertension and bedbound; who presented to [facility] emergency department secondary to worsening left foot infection, decrease appetite and worsening confusion. Patient's daughter reports over the last month mom has been actively declining, eating less and with worsening drainage and appearance in her chronic left foot wound, other work-up showed possible osteomyelitis in the left foot and she was sent to [facility] for further care.   Subjective:   Patient in bed, appears comfortable, denies any headache, no fever, no chest pain or pressure, no shortness of breath , no abdominal pain. No focal weakness.    Assessment  & Plan :    1. Sepsis due to left foot cellulitis along with osteomyelitis chronic. On empiric IV antibiotics, has been hydrated and sepsis pathophysiology has resolved, orthopedics is suggested wound care and conservative treatment with maximum possibility of amputation if needed. Extremely poor candidate for any complex surgical intervention like revascularization etc. orthopedics evaluating for possible amputation, wound care will be involved.  Continue to monitor on empiric antibiotics changed to vancomycin and cefepime on 05/16/2020.  1 out of 2 blood cultures from [facility] looks like was contaminated with coag negative  staph.  Recent Labs  Lab 05/13/20 1145 05/13/20 1851 05/14/20 0756 05/15/20 0750 05/16/20 0659  WBC 22.0*  --  18.2* 17.2* 11.7*  PLT 192  --  173 199 165  CRP  --   --  20.9* 18.2* 19.1*  PROCALCITON  --   --  0.29 0.16 <0.10  LATICACIDVEN 2.8* <0.3*  --   --   --      2. Chronic deconditioning, cachexia, multiple sclerosis. She is bedbound, severe contractures in all 4 extremities especially lower extremities, continue supportive care.  Long discussion with patient's daughters on 05/14/2020, DNR, continue medical  treatment, possible amputation if orthopedics thinks it will benefit.  No heroics.  If significant decline then focus on full comfort.   3. Multiple unstageable decubitus ulcers and pressure wounds present on admission. Kindly see nursing note. Wound care consulted.  4. History of dementia. Continue Aricept and supportive care. At risk for delirium. Minimize narcotics and benzodiazepines.  5. Hypertension. Blood pressure currently soft, 2 units packed RBC on 05/15/2020 and monitor.  6. Severe PCM and deconditioning. Nutritionist consult, protein supplementations, PT OT.  7.  Hypokalemia.  Replaced will monitor.    8. Severe chronic microcytic anemia worse with heme dilution due to IV fluids.  Signs of ongoing active bleeding, PPI, iron and folic acid orally, 2 units of packed RBC on 05/15/2020, monitor.  Not a candidate for invasive testing or procedures.   Pressure Injury 05/13/20 Buttocks Medial;Bilateral Unstageable - Full thickness tissue loss in which the base of the injury is covered by slough (yellow, tan, gray, green or brown) and/or eschar (tan, brown or black) in the wound bed. (Active)  05/13/20 1030  Location: Buttocks  Location Orientation: Medial;Bilateral  Staging: Unstageable - Full thickness tissue loss in which the base of the injury is covered by slough (yellow, tan, gray, green or brown) and/or eschar (tan, brown or black) in the wound bed.  Wound  Description (Comments):   Present on Admission: Yes     Pressure Injury 05/13/20 Ankle Right Unstageable - Full thickness tissue loss in which the base of the injury is covered by slough (yellow, tan, gray, green or brown) and/or eschar (tan, brown or black) in the wound bed. (Active)  05/13/20 1030  Location: Ankle  Location Orientation: Right  Staging: Unstageable - Full thickness tissue loss in which the base of the injury is covered by slough (yellow, tan, gray, green or brown) and/or eschar (tan, brown or black) in the wound bed.  Wound Description (Comments):   Present on Admission: Yes     Pressure Injury 05/13/20 Ankle Left Unstageable - Full thickness tissue loss in which the base of the injury is covered by slough (yellow, tan, gray, green or brown) and/or eschar (tan, brown or black) in the wound bed. (Active)  05/13/20 1030  Location: Ankle  Location Orientation: Left  Staging: Unstageable - Full thickness tissue loss in which the base of the injury is covered by slough (yellow, tan, gray, green or brown) and/or eschar (tan, brown or black) in the wound bed.  Wound Description (Comments):   Present on Admission: Yes     Pressure Injury 05/13/20 Knee Right Unstageable - Full thickness tissue loss in which the base of the injury is covered by slough (yellow, tan, gray, green or brown) and/or eschar (tan, brown or black) in the wound bed. (Active)  05/13/20 1030  Location: Knee  Location Orientation: Right  Staging: Unstageable - Full thickness tissue loss in which the base of the injury is covered by slough (yellow, tan, gray, green or brown) and/or eschar (tan, brown or black) in the wound bed.  Wound Description (Comments):   Present on Admission:     Condition -   Guarded  Family Communication  : daughter Tracy Armstrong - [phone] will be called on 05/15/2020 at 6pm, detailed prognosis discussion, DNR, full Med Rx, no heroics.  Updated again on 05/15/2020, 05/16/20  Code Status  : DNR now  Consults  :  Ortho, Pall Care  Procedures  :    TTE  MRI L.Foot -  1. Deep soft  tissue ulceration along the medial aspect of the ankle with ulcer base extending to the medial cortex of the medial malleolus and talus. Associated signal changes within the peripheral aspects of the medial malleolus and medial talus compatible with acute osteomyelitis. 2. Trace tibiotalar joint effusion with enhancing synovium concerning for septic arthritis. There appears to be air or gas within the tibiotalar joint, possibly extending from adjacent ulceration. 3. Trace talonavicular joint effusion with marrow edema and cortical irregularity along both sides of the joint suspicious for septic arthritis and osteomyelitis. 4. Similar findings at the calcaneocuboid joint also suspicious for septic arthritis and osteomyelitis. 5. Additional ulceration over the at the posterior hindfoot with acute osteomyelitis of the posterior calcaneus. 6. Marrow edema within the residual great toe distal phalanx which may reflect osteitis versus early osteomyelitis. 7. Extensive cellulitis with numerous foci of susceptibility tracking within the soft tissues of the lateral forefoot concerning for gas-forming infection. No drainable fluid collection. 8. Tendinous and ligamentous abnormalities, as above  PUD Prophylaxis : PPI  Disposition Plan  :    Status is: Inpatient  Remains inpatient appropriate because:IV treatments appropriate due to intensity of illness or inability to take PO and Inpatient level of care appropriate due to severity of illness   Dispo: The patient is from: Home              Anticipated d/c is to: Home              Anticipated d/c date is: 3 days              Patient currently is not medically stable to d/c.   DVT Prophylaxis  :   Heparin  Lab Results  Component Value Date   PLT 165 05/16/2020    Diet :  Diet Order            DIET DYS 3 Room service appropriate? Yes; Fluid consistency:  Thin  Diet effective now                  Inpatient Medications Scheduled Meds:  (feeding supplement) PROSource Plus  30 mL Oral Daily   docusate sodium  200 mg Oral BID   donepezil  10 mg Oral QHS   feeding supplement (ENSURE ENLIVE)  237 mL Oral BID BM   ferrous sulfate  325 mg Oral BID WC   folic acid  1 mg Oral Daily   heparin  5,000 Units Subcutaneous Q8H   mirtazapine  7.5 mg Oral QHS   pantoprazole  40 mg Oral Daily   Continuous Infusions:  vancomycin Stopped (05/15/20 1815)   PRN Meds:.acetaminophen **OR** [DISCONTINUED] acetaminophen, [DISCONTINUED] ondansetron **OR** ondansetron (ZOFRAN) IV  Antibiotics  :   Anti-infectives (From admission, onward)   Start     Dose/Rate Route Frequency Ordered Stop   05/16/20 0200  piperacillin-tazobactam (ZOSYN) IVPB 3.375 g  Status:  Discontinued        3.375 g 12.5 mL/hr over 240 Minutes Intravenous Every 8 hours 05/15/20 2042 05/16/20 1134   05/14/20 1400  vancomycin (VANCOREADY) IVPB 750 mg/150 mL     Discontinue     750 mg 150 mL/hr over 60 Minutes Intravenous Every 24 hours 05/13/20 1234     05/13/20 2200  piperacillin-tazobactam (ZOSYN) IVPB 3.375 g  Status:  Discontinued        3.375 g 12.5 mL/hr over 240 Minutes Intravenous Every 8 hours 05/13/20 1236 05/15/20 2042   05/13/20 1130  piperacillin-tazobactam (ZOSYN) IVPB 3.375 g  Status:  Discontinued        3.375 g 100 mL/hr over 30 Minutes Intravenous  Once 05/13/20 1115 05/13/20 1751   05/13/20 1130  clindamycin (CLEOCIN) IVPB 600 mg  Status:  Discontinued        600 mg 100 mL/hr over 30 Minutes Intravenous  Once 05/13/20 1123 05/13/20 1751   05/13/20 1100  vancomycin (VANCOREADY) IVPB 1500 mg/300 mL        1,500 mg 150 mL/hr over 120 Minutes Intravenous  Once 05/13/20 1041 05/13/20 1811   05/13/20 1045  vancomycin (VANCOCIN) IVPB 1000 mg/200 mL premix  Status:  Discontinued        1,000 mg 200 mL/hr over 60 Minutes Intravenous  Once 05/13/20 1034 05/13/20  1040   05/13/20 1045  cefTRIAXone (ROCEPHIN) 2 g in sodium chloride 0.9 % 100 mL IVPB  Status:  Discontinued        2 g 200 mL/hr over 30 Minutes Intravenous  Once 05/13/20 1034 05/13/20 1113          Objective:   Vitals:   05/16/20 0115 05/16/20 0215 05/16/20 0400 05/16/20 0715  BP: 97/63 95/61  98/62  Pulse: 99 98 99 89  Resp: 16 18 14 15   Temp: 98.9 F (37.2 C) 97.8 F (36.6 C) 97.7 F (36.5 C) 98.1 F (36.7 C)  TempSrc: Oral Axillary Axillary Oral  SpO2: 100% 100% 100% 100%  Weight:      Height:        SpO2: 100 %  Wt Readings from Last 3 Encounters:  05/13/20 63.2 kg  07/19/11 59.9 kg     Intake/Output Summary (Last 24 hours) at 05/16/2020 1135 Last data filed at 05/16/2020 0406 Gross per 24 hour  Intake 988 ml  Output 1400 ml  Net -412 ml     Physical Exam  Awake Alert overall, minimally confused at times, extremely weak lower extremities from advanced MS, severely weak and cachectic, [place].AT,PERRAL Supple Neck,No JVD, No cervical lymphadenopathy appriciated.  Symmetrical Chest wall movement, Good air movement bilaterally, CTAB RRR,No Gallops, Rubs or new Murmurs, No Parasternal Heave +ve B.Sounds, Abd Soft, No tenderness, No organomegaly appriciated, No rebound - guarding or rigidity. Severe chronic lower extremity contractures, mild contractures in upper extremity right hand more than left, both feet under dressing    Pressure Injury 05/13/20 Buttocks Medial;Bilateral Unstageable - Full thickness tissue loss in which the base of the injury is covered by slough (yellow, tan, gray, green or brown) and/or eschar (tan, brown or black) in the wound bed. (Active)  05/13/20 1030  Location: Buttocks  Location Orientation: Medial;Bilateral  Staging: Unstageable - Full thickness tissue loss in which the base of the injury is covered by slough (yellow, tan, gray, green or brown) and/or eschar (tan, brown or black) in the wound bed.  Wound Description (Comments):    Present on Admission: Yes     Pressure Injury 05/13/20 Ankle Right Unstageable - Full thickness tissue loss in which the base of the injury is covered by slough (yellow, tan, gray, green or brown) and/or eschar (tan, brown or black) in the wound bed. (Active)  05/13/20 1030  Location: Ankle  Location Orientation: Right  Staging: Unstageable - Full thickness tissue loss in which the base of the injury is covered by slough (yellow, tan, gray, green or brown) and/or eschar (tan, brown or black) in the wound bed.  Wound Description (Comments):   Present on Admission: Yes     Pressure Injury 05/13/20 Ankle Left  Unstageable - Full thickness tissue loss in which the base of the injury is covered by slough (yellow, tan, gray, green or brown) and/or eschar (tan, brown or black) in the wound bed. (Active)  05/13/20 1030  Location: Ankle  Location Orientation: Left  Staging: Unstageable - Full thickness tissue loss in which the base of the injury is covered by slough (yellow, tan, gray, green or brown) and/or eschar (tan, brown or black) in the wound bed.  Wound Description (Comments):   Present on Admission: Yes     Pressure Injury 05/13/20 Knee Right Unstageable - Full thickness tissue loss in which the base of the injury is covered by slough (yellow, tan, gray, green or brown) and/or eschar (tan, brown or black) in the wound bed. (Active)  05/13/20 1030  Location: Knee  Location Orientation: Right  Staging: Unstageable - Full thickness tissue loss in which the base of the injury is covered by slough (yellow, tan, gray, green or brown) and/or eschar (tan, brown or black) in the wound bed.  Wound Description (Comments):   Present on Admission:      Data Review:    Recent Labs  Lab 05/13/20 1145 05/14/20 0756 05/15/20 0750 05/16/20 0659  WBC 22.0* 18.2* 17.2* 11.7*  HGB 8.4* 7.2* 6.2* 10.0*  HCT 29.9* 24.1* 21.5* 31.4*  PLT 192 173 199 165  MCV 88.5 81.1 83.0 82.2  MCH 24.9* 24.2*  23.9* 26.2  MCHC 28.1* 29.9* 28.8* 31.8  RDW 17.5* 17.4* 17.6* 15.9*  LYMPHSABS 1.6  --  2.6 1.8  MONOABS 1.0  --  0.9 0.7  EOSABS 0.0  --  0.1 0.1  BASOSABS 0.0  --  0.0 0.0    Recent Labs  Lab 05/13/20 1145 05/13/20 1849 05/14/20 0756 05/15/20 0750 05/16/20 0659  NA 135  --  138 140 142  K 3.9  --  3.5 3.5 3.1*  CL 103  --  107 108 108  CO2 21*  --  23 23 22   GLUCOSE 122*  --  81 77 84  BUN 20  --  16 12 14   CREATININE 0.55  --  0.55 0.50 0.58  CALCIUM 8.3*  --  8.1* 7.9* 7.6*  AST 13*  --   --  11* 10*  ALT 9  --   --  8 9  ALKPHOS 135*  --   --  94 86  BILITOT 0.5  --   --  0.2* 0.9  ALBUMIN 2.0*  --   --  1.3* 1.3*  MG  --  1.9 1.8 1.8 1.8  CRP  --   --  20.9* 18.2* 19.1*  PROCALCITON  --   --  0.29 0.16 <0.10  INR 1.3*  --   --   --   --   TSH  --  0.803  --   --   --   BNP  --   --  812.7* 149.8* 225.9*    Recent Labs  Lab 05/13/20 1059 05/14/20 0756 05/15/20 0750 05/16/20 0659  CRP  --  20.9* 18.2* 19.1*  BNP  --  812.7* 149.8* 225.9*  PROCALCITON  --  0.29 0.16 <0.10  SARSCOV2NAA NEGATIVE  --   --   --     ------------------------------------------------------------------------------------------------------------------ No results for input(s): CHOL, HDL, LDLCALC, TRIG, CHOLHDL, LDLDIRECT in the last 72 hours.  No results found for: HGBA1C ------------------------------------------------------------------------------------------------------------------ Recent Labs    05/13/20 1849  TSH 0.803   ------------------------------------------------------------------------------------------------------------------ Recent Labs  05/13/20 1849 05/14/20 1409  VITAMINB12 639 633  FOLATE  --  13.3  FERRITIN  --  921*  TIBC  --  102*  IRON  --  13*  RETICCTPCT  --  1.3    Coagulation profile Recent Labs  Lab 05/13/20 1145  INR 1.3*    No results for input(s): DDIMER in the last 72 hours.  Cardiac Enzymes No results for input(s): CKMB,  TROPONINI, MYOGLOBIN in the last 168 hours.  Invalid input(s): CK ------------------------------------------------------------------------------------------------------------------    Component Value Date/Time   BNP 225.9 (H) 05/16/2020 1610    Micro Results Recent Results (from the past 240 hour(s))  SARS Coronavirus 2 by RT PCR (hospital order, performed in [facility] hospital lab) Nasopharyngeal Nasopharyngeal Swab     Status: None   Collection Time: 05/13/20 10:59 AM   Specimen: Nasopharyngeal Swab  Result Value Ref Range Status   SARS Coronavirus 2 NEGATIVE NEGATIVE Final    Comment: (NOTE) SARS-CoV-2 target nucleic acids are NOT DETECTED.  The SARS-CoV-2 RNA is generally detectable in upper and lower respiratory specimens during the acute phase of infection. The lowest concentration of SARS-CoV-2 viral copies this assay can detect is 250 copies / mL. A negative result does not preclude SARS-CoV-2 infection and should not be used as the sole basis for treatment or other patient management decisions.  A negative result may occur with improper specimen collection / handling, submission of specimen other than nasopharyngeal swab, presence of viral mutation(s) within the areas targeted by this assay, and inadequate number of viral copies (<250 copies / mL). A negative result must be combined with clinical observations, patient history, and epidemiological information.  Fact Sheet for Patients:   BoilerBrush.com.cy  Fact Sheet for Healthcare Providers: https://pope.com/  This test is not yet approved or  cleared by the [place] FDA and has been authorized for detection and/or diagnosis of SARS-CoV-2 by FDA under an Emergency Use Authorization (EUA).  This EUA will remain in effect (meaning this test can be used) for the duration of the COVID-19 declaration under Section 564(b)(1) of the Act, 21 U.S.C. section  360bbb-3(b)(1), unless the authorization is terminated or revoked sooner.  Performed at [facility], [address], [place], [place] [address]   Blood Culture (routine x 2)     Status: Abnormal   Collection Time: 05/13/20 11:45 AM   Specimen: BLOOD RIGHT HAND  Result Value Ref Range Status   Specimen Description BLOOD RIGHT HAND  Final   Special Requests NONE  Final   Culture  Setup Time   Final    GRAM POSITIVE COCCI AEROBIC BOTTLE ONLY Gram Stain Report Called to,Read Back By and Verified With: MICHELLE MOORE,RN  05/15/2020 KAY CRITICAL RESULT CALLED TO, READ BACK BY AND VERIFIED WITH: PHARMD K PIERCE [id number]  AT 1335 BY CM    Culture (A)  Final    STAPHYLOCOCCUS SPECIES (COAGULASE NEGATIVE) THE SIGNIFICANCE OF ISOLATING THIS ORGANISM FROM A SINGLE SET OF BLOOD CULTURES WHEN MULTIPLE SETS ARE DRAWN IS UNCERTAIN. PLEASE NOTIFY THE MICROBIOLOGY DEPARTMENT WITHIN ONE WEEK IF SPECIATION AND SENSITIVITIES ARE REQUIRED. Performed at [facility], [address], [place], [place] [address]    Report Status 05/16/2020 FINAL  Final  Blood Culture (routine x 2)     Status: None (Preliminary result)   Collection Time: 05/13/20 11:45 AM   Specimen: BLOOD  Result Value Ref Range Status   Specimen Description BLOOD RIGHT ANTECUBITAL  Final   Special Requests  Final    BOTTLES DRAWN AEROBIC AND ANAEROBIC Blood Culture adequate volume   Culture   Final    NO GROWTH 3 DAYS Performed at [facility], [address] [place], [place] [address]    Report Status PENDING  Incomplete  Blood Culture ID Panel (Reflexed)     Status: Abnormal   Collection Time: 05/13/20 11:45 AM  Result Value Ref Range Status   Enterococcus species NOT DETECTED NOT DETECTED Final   Listeria monocytogenes NOT DETECTED NOT DETECTED Final   Staphylococcus species DETECTED (A) NOT DETECTED Final    Comment: Methicillin (oxacillin) susceptible coagulase negative staphylococcus. Possible blood culture  contaminant (unless isolated from more than one blood culture draw or clinical case suggests pathogenicity). No antibiotic treatment is indicated for blood  culture contaminants. CRITICAL RESULT CALLED TO, READ BACK BY AND VERIFIED WITH: PHARMD K PIERCE [id number] AT 1335 BY CM    Staphylococcus aureus (BCID) NOT DETECTED NOT DETECTED Final   Methicillin resistance NOT DETECTED NOT DETECTED Final   Streptococcus species NOT DETECTED NOT DETECTED Final   Streptococcus agalactiae NOT DETECTED NOT DETECTED Final   Streptococcus pneumoniae NOT DETECTED NOT DETECTED Final   Streptococcus pyogenes NOT DETECTED NOT DETECTED Final   Acinetobacter baumannii NOT DETECTED NOT DETECTED Final   Enterobacteriaceae species NOT DETECTED NOT DETECTED Final   Enterobacter cloacae complex NOT DETECTED NOT DETECTED Final   Escherichia coli NOT DETECTED NOT DETECTED Final   Klebsiella oxytoca NOT DETECTED NOT DETECTED Final   Klebsiella pneumoniae NOT DETECTED NOT DETECTED Final   Proteus species NOT DETECTED NOT DETECTED Final   Serratia marcescens NOT DETECTED NOT DETECTED Final   Haemophilus influenzae NOT DETECTED NOT DETECTED Final   Neisseria meningitidis NOT DETECTED NOT DETECTED Final   Pseudomonas aeruginosa NOT DETECTED NOT DETECTED Final   Candida albicans NOT DETECTED NOT DETECTED Final   Candida glabrata NOT DETECTED NOT DETECTED Final   Candida krusei NOT DETECTED NOT DETECTED Final   Candida parapsilosis NOT DETECTED NOT DETECTED Final   Candida tropicalis NOT DETECTED NOT DETECTED Final    Comment: Performed at [facility], [address] [place], [place] [address]  MRSA PCR Screening     Status: None   Collection Time: 05/15/20 10:17 AM   Specimen: Nasal Mucosa; Nasopharyngeal  Result Value Ref Range Status   MRSA by PCR NEGATIVE NEGATIVE Final    Comment:        The GeneXpert MRSA Assay (FDA approved for NASAL specimens only), is one component of a comprehensive MRSA  colonization surveillance program. It is not intended to diagnose MRSA infection nor to guide or monitor treatment for MRSA infections. Performed at [facility], [address], [place], [place] [address]     Radiology Reports DG Ankle 2 Views Right  Result Date: 05/13/2020 CLINICAL DATA:  Pressure wound. EXAM: RIGHT ANKLE - 2 VIEW COMPARISON:  No recent prior. FINDINGS: Large soft tissue wound noted over the medial aspect of the right ankle. No radiopaque foreign body. No underlying bony erosion. No evidence of fracture or dislocation. Diffuse osteopenia and degenerative change. IMPRESSION: 1. Large soft tissue wound noted over the medial aspect of the right ankle. No radiopaque foreign body. No underlying bony erosion. 2. No acute bony abnormality. Diffuse osteopenia and degenerative change. Electronically Signed   By: Maisie Fus  Register   On: 05/13/2020 12:33   MR FOOT LEFT W WO CONTRAST  Result Date: 05/13/2020 CLINICAL DATA:  Nonhealing pressure all ulcers  of the left foot and ankle EXAM: MRI OF THE LEFT FOREFOOT WITHOUT AND WITH CONTRAST; MRI OF THE LEFT ANKLE WITHOUT AND WITH CONTRAST TECHNIQUE: Multiplanar, multisequence MR imaging of the left foot and ankle was performed both before and after administration of intravenous contrast. CONTRAST:  6mL GADAVIST GADOBUTROL 1 MMOL/ML IV SOLN COMPARISON:  X-ray 05/13/2020 FINDINGS: Bones: There is a deep soft tissue ulceration along the medial aspect of the ankle with ulcer base closely approximating the medial cortex of the medial malleolus and talus. Associated bone marrow edema and enhancement within the peripheral aspect of the medial malleolus and medial talus with loss of cortical definition and low T1 bone marrow signal compatible with acute osteomyelitis (series 5 and 6, image 10). Susceptibility within the tibiotalar joint space suggesting air tracking intra-articularly. Trace tibiotalar joint effusion with enhancing synovium concerning  for septic arthritis. Trace talonavicular joint effusion with marrow edema and cortical irregularity along both sides of the joint (series 8 and 9, image 12). Findings are suspicious for septic arthritis and osteomyelitis. Similar findings are present at the calcaneocuboid joint with small joint effusion and associated subchondral marrow edema (series 9, image 17). Marrow edema within the residual great toe distal phalanx (series 9, image 10) raises suspicion for osteomyelitis. There is evidence of previous resection of the distal tuft. Additional ulceration over the posterior calcaneus with marrow edema and low T1 signal along the peripheral margin of the posterior calcaneus suspicious for osteomyelitis (series 1 and 2, image 11). Tendons: The posterior tibialis and flexor digitorum longus tendons extend into the area of ulceration at the lateral ankle and are not well delineated, and may be torn. Flexor hallucis longus tendon intact. Tendinosis of the distal Achilles tendon with poor delineation of the distal insertion at site of likely pressure ulceration. Peroneal tendons appear intact. Ligaments: LisFranc ligament intact. Deltoid and spring ligament complex are poorly evaluated at site of ulceration, likely torn. Lateral ankle tendons grossly intact. Soft tissues: Deep soft tissue ulceration at the lateral ankle, as described above. Extensive surrounding soft tissue edema and enhancement compatible with cellulitis. No drainable fluid collection. Extensive pressure ulceration at the posterior hindfoot. There are extensive foci of susceptibility tracking within the soft tissues of the lateral forefoot (series 8, images 22-23). Additional areas of more superficial ulceration along the plantar aspect of the midfoot and forefoot with surrounding cellulitis. IMPRESSION: 1. Deep soft tissue ulceration along the medial aspect of the ankle with ulcer base extending to the medial cortex of the medial malleolus and talus.  Associated signal changes within the peripheral aspects of the medial malleolus and medial talus compatible with acute osteomyelitis. 2. Trace tibiotalar joint effusion with enhancing synovium concerning for septic arthritis. There appears to be air or gas within the tibiotalar joint, possibly extending from adjacent ulceration. 3. Trace talonavicular joint effusion with marrow edema and cortical irregularity along both sides of the joint suspicious for septic arthritis and osteomyelitis. 4. Similar findings at the calcaneocuboid joint also suspicious for septic arthritis and osteomyelitis. 5. Additional ulceration over the at the posterior hindfoot with acute osteomyelitis of the posterior calcaneus. 6. Marrow edema within the residual great toe distal phalanx which may reflect osteitis versus early osteomyelitis. 7. Extensive cellulitis with numerous foci of susceptibility tracking within the soft tissues of the lateral forefoot concerning for gas-forming infection. No drainable fluid collection. 8. Tendinous and ligamentous abnormalities, as above. These results will be called to the ordering clinician or representative by the Radiologist Assistant, and communication  documented in the PACS or [employer]. Electronically Signed   By: Duanne Guess D.O.   On: 05/13/2020 15:56   MR ANKLE LEFT W WO CONTRAST  Result Date: 05/13/2020 CLINICAL DATA:  Nonhealing pressure all ulcers of the left foot and ankle EXAM: MRI OF THE LEFT FOREFOOT WITHOUT AND WITH CONTRAST; MRI OF THE LEFT ANKLE WITHOUT AND WITH CONTRAST TECHNIQUE: Multiplanar, multisequence MR imaging of the left foot and ankle was performed both before and after administration of intravenous contrast. CONTRAST:  72mL GADAVIST GADOBUTROL 1 MMOL/ML IV SOLN COMPARISON:  X-ray 05/13/2020 FINDINGS: Bones: There is a deep soft tissue ulceration along the medial aspect of the ankle with ulcer base closely approximating the medial cortex of the medial  malleolus and talus. Associated bone marrow edema and enhancement within the peripheral aspect of the medial malleolus and medial talus with loss of cortical definition and low T1 bone marrow signal compatible with acute osteomyelitis (series 5 and 6, image 10). Susceptibility within the tibiotalar joint space suggesting air tracking intra-articularly. Trace tibiotalar joint effusion with enhancing synovium concerning for septic arthritis. Trace talonavicular joint effusion with marrow edema and cortical irregularity along both sides of the joint (series 8 and 9, image 12). Findings are suspicious for septic arthritis and osteomyelitis. Similar findings are present at the calcaneocuboid joint with small joint effusion and associated subchondral marrow edema (series 9, image 17). Marrow edema within the residual great toe distal phalanx (series 9, image 10) raises suspicion for osteomyelitis. There is evidence of previous resection of the distal tuft. Additional ulceration over the posterior calcaneus with marrow edema and low T1 signal along the peripheral margin of the posterior calcaneus suspicious for osteomyelitis (series 1 and 2, image 11). Tendons: The posterior tibialis and flexor digitorum longus tendons extend into the area of ulceration at the lateral ankle and are not well delineated, and may be torn. Flexor hallucis longus tendon intact. Tendinosis of the distal Achilles tendon with poor delineation of the distal insertion at site of likely pressure ulceration. Peroneal tendons appear intact. Ligaments: LisFranc ligament intact. Deltoid and spring ligament complex are poorly evaluated at site of ulceration, likely torn. Lateral ankle tendons grossly intact. Soft tissues: Deep soft tissue ulceration at the lateral ankle, as described above. Extensive surrounding soft tissue edema and enhancement compatible with cellulitis. No drainable fluid collection. Extensive pressure ulceration at the posterior  hindfoot. There are extensive foci of susceptibility tracking within the soft tissues of the lateral forefoot (series 8, images 22-23). Additional areas of more superficial ulceration along the plantar aspect of the midfoot and forefoot with surrounding cellulitis. IMPRESSION: 1. Deep soft tissue ulceration along the medial aspect of the ankle with ulcer base extending to the medial cortex of the medial malleolus and talus. Associated signal changes within the peripheral aspects of the medial malleolus and medial talus compatible with acute osteomyelitis. 2. Trace tibiotalar joint effusion with enhancing synovium concerning for septic arthritis. There appears to be air or gas within the tibiotalar joint, possibly extending from adjacent ulceration. 3. Trace talonavicular joint effusion with marrow edema and cortical irregularity along both sides of the joint suspicious for septic arthritis and osteomyelitis. 4. Similar findings at the calcaneocuboid joint also suspicious for septic arthritis and osteomyelitis. 5. Additional ulceration over the at the posterior hindfoot with acute osteomyelitis of the posterior calcaneus. 6. Marrow edema within the residual great toe distal phalanx which may reflect osteitis versus early osteomyelitis. 7. Extensive cellulitis with numerous foci of susceptibility tracking  within the soft tissues of the lateral forefoot concerning for gas-forming infection. No drainable fluid collection. 8. Tendinous and ligamentous abnormalities, as above. These results will be called to the ordering clinician or representative by the Radiologist Assistant, and communication documented in the PACS or [employer]. Electronically Signed   By: Duanne Guess D.O.   On: 05/13/2020 15:56   DG Chest Port 1 View  Result Date: 05/13/2020 CLINICAL DATA:  Sepsis EXAM: PORTABLE CHEST 1 VIEW COMPARISON:  Portable exam 1041 hours without priors for comparison FINDINGS: Normal heart size, mediastinal  contours, and pulmonary vascularity. Lungs clear. No pulmonary infiltrate, pleural effusion or pneumothorax. Bones demineralized. IMPRESSION: No acute abnormalities. Electronically Signed   By: Ulyses Southward M.D.   On: 05/13/2020 11:05   DG Foot Complete Left  Result Date: 05/13/2020 CLINICAL DATA:  Multiple nonhealing pressure ulcers, sepsis EXAM: LEFT FOOT - COMPLETE 4 VIEW COMPARISON:  None FINDINGS: Nonstandard positioning due to patient condition. Marked osseous demineralization. Diffuse soft tissue swelling LEFT foot with foci of soft tissue gas at the plantar aspect of the foot at the level of the fourth and fifth metatarsals. This could be due to open wound or infection by a gas-forming organism. 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 Advanced degenerative changes at the talonavicular joint with superior navicular subluxation, could represent inflammatory arthropathy or septic arthritis. Probable ankle joint effusion. Additional degenerative changes at the naviculocuneiform joint and at the calcaneocuboid joint. No acute fracture, dislocation, or additional bone destruction. Lucency projecting over ankle joint on lateral view likely represents an additional ulcer though this is less well localized due to nonstandard positioning. IMPRESSION: Soft tissue gas at plantar aspect of lateral LEFT foot at the level of the fourth and fifth metatarsals either due to open wound or infection by a gas-forming organism. Osseous demineralization with scattered degenerative changes as well as destructive changes and subluxation at the talonavicular joint, could represent inflammatory arthropathy but is concerning for septic arthritis. Suspected ankle joint effusion. Findings called to Harlene Salts PA on 05/13/2020 at 1110 hours. Electronically Signed   By: Ulyses Southward M.D.   On: 05/13/2020 11:12    Time Spent in minutes  30   Susa Raring M.D on 05/16/2020 at 11:35 AM  To page go  to [email or web address] - password [facility]

## 2020-05-16 NOTE — Consult Note (Signed)
ORTHOPAEDIC CONSULTATION  REQUESTING PHYSICIAN: Leroy Sea, MD  Chief Complaint: Ulceration bilateral lower extremities with osteomyelitis involving the left hindfoot.  HPI: Tracy Armstrong is a 74 y.o. female who presents with contractures involving both lower extremities.  Patient lives at home she is cared for by family.  Patient has fixed contractures of both hips and knees.  Past Medical History:  Diagnosis Date  . Multiple sclerosis (HCC) 2009   Past Surgical History:  Procedure Laterality Date  . CHOLECYSTECTOMY     Social History   Socioeconomic History  . Marital status: Widowed    Spouse name: Not on file  . Number of children: Not on file  . Years of education: Not on file  . Highest education level: Not on file  Occupational History  . Not on file  Tobacco Use  . Smoking status: Never Smoker  . Smokeless tobacco: Never Used  Substance and Sexual Activity  . Alcohol use: No  . Drug use: No  . Sexual activity: Not on file  Other Topics Concern  . Not on file  Social History Narrative  . Not on file   Social Determinants of Health   Financial Resource Strain:   . Difficulty of Paying Living Expenses:   Food Insecurity:   . Worried About Programme researcher, broadcasting/film/video in the Last Year:   . Barista in the Last Year:   Transportation Needs:   . Freight forwarder (Medical):   Marland Kitchen Lack of Transportation (Non-Medical):   Physical Activity:   . Days of Exercise per Week:   . Minutes of Exercise per Session:   Stress:   . Feeling of Stress :   Social Connections:   . Frequency of Communication with Friends and Family:   . Frequency of Social Gatherings with Friends and Family:   . Attends Religious Services:   . Active Member of Clubs or Organizations:   . Attends Banker Meetings:   Marland Kitchen Marital Status:    History reviewed. No pertinent family history. - negative except otherwise stated in the family history section No Known  Allergies Prior to Admission medications   Medication Sig Start Date End Date Taking? Authorizing Provider  bisoprolol-hydrochlorothiazide (ZIAC) 5-6.25 MG tablet Take 1 tablet by mouth daily. 03/25/20  Yes [provider]  donepezil (ARICEPT) 10 MG tablet Take 10 mg by mouth at bedtime.  12/28/19  Yes [provider]  furosemide (LASIX) 20 MG tablet Take 20 mg by mouth daily. 03/25/20  Yes [provider]  mirtazapine (REMERON) 7.5 MG tablet Take 7.5 mg by mouth at bedtime. 05/02/20  Yes [provider]   No results found. - pertinent xrays, CT, MRI studies were reviewed and independently interpreted  Positive ROS: All other systems have been reviewed and were otherwise negative with the exception of those mentioned in the HPI and as above.  Physical Exam: General: Alert, no acute distress Psychiatric: Patient is competent for consent with normal mood and affect Lymphatic: No axillary or cervical lymphadenopathy Cardiovascular: No pedal edema Respiratory: No cyanosis, no use of accessory musculature GI: No organomegaly, abdomen is soft and non-tender    Images:  @ENCIMAGES @  Labs:  Lab Results  Component Value Date   CRP 19.1 (H) 05/16/2020   CRP 18.2 (H) 05/15/2020   CRP 20.9 (H) 05/14/2020   REPTSTATUS PENDING 05/13/2020   REPTSTATUS PENDING 05/13/2020   CULT GRAM POSITIVE COCCI 05/13/2020   CULT  05/13/2020  NO GROWTH 3 DAYS Performed at [facility], [address] [place], [place] [address]    Leanor Kail PNEUMONIAE 07/19/2011    Lab Results  Component Value Date   ALBUMIN 1.3 (L) 05/16/2020   ALBUMIN 1.3 (L) 05/15/2020   ALBUMIN 2.0 (L) 05/13/2020    Neurologic: Patient does not have protective sensation bilateral lower extremities.   MUSCULOSKELETAL:   Skin: Examination patient has ulceration involving both lower extremities.  MRI scan shows osteomyelitis involving the left hindfoot.  She has ulcerations  involving both knees as well.  The knees are fixed in flexion with her heels against her buttocks both legs are fixed in abduction and both hips are fixed in flexion.  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  Plan: Plan: Discussed with the patient and her best option is to proceed with bilateral above-the-knee amputations.  Discussed that this should allow for easier care easier hygiene and easier transfers.  I will discussed this with the patient's family today.  Thank you for the consult and the opportunity to see Ms. Marjory Lies, MD [facility] [phone] 8:20 AM

## 2020-05-16 NOTE — Progress Notes (Signed)
Pharmacy Antibiotic Note  Tracy Armstrong is a 74 y.o. female admitted on 05/13/2020 with multiple non-healing pressure wounds.  Pharmacy initially consulted for Vancomycin and Zosyn.  Asked to change today to Vancomycin and Cefepime.    Pt has MS and paraplegia likely leading to an overestimation of her renal function.  SCr 0.5.  Today is total abx day #4.  Plan: Check Vancomycin trough level before tonights dose (scheduled for 1600). Change Zosyn to Cefepime 2gm IV q12h. Follow-up plans for bilateral AKA vs long term IV abx  Height: 5\' 6"  (167.6 cm) Weight: 63.2 kg (139 lb 5.3 oz) IBW/kg (Calculated) : 59.3  Temp (24hrs), Avg:98 F (36.7 C), Min:97 F (36.1 C), Max:99.5 F (37.5 C)  Recent Labs  Lab 05/13/20 1145 05/13/20 1851 05/14/20 0756 05/15/20 0750 05/16/20 0659  WBC 22.0*  --  18.2* 17.2* 11.7*  CREATININE 0.55  --  0.55 0.50 0.58  LATICACIDVEN 2.8* <0.3*  --   --   --     Estimated Creatinine Clearance: 59.5 mL/min (by C-G formula based on SCr of 0.58 mg/dL).    No Known Allergies  Antimicrobials this admission: Vancomycin 7/9 >>  Zosyn 7/9>> 7/12 Cefepime 7/12 >>  Dose adjustments this admission:   Microbiology results: 7/9  [facility] x2:   1/4 coag neg staph   Thank you for allowing pharmacy to be a part of this patients care.  [facility] Pharm.D., BCPS Clinical Pharmacist Clinical phone for 05/16/2020 from 8:30-4:00 is [phone].  **Pharmacist phone directory can be found on [email or web address] listed under [facility].  05/16/2020 2:33 PM

## 2020-05-17 ENCOUNTER — Other Ambulatory Visit: Payer: Self-pay | Admitting: Physician Assistant

## 2020-05-17 LAB — CBC WITH DIFFERENTIAL/PLATELET
Abs Immature Granulocytes: 0.14 10*3/uL — ABNORMAL HIGH (ref 0.00–0.07)
Basophils Absolute: 0 10*3/uL (ref 0.0–0.1)
Basophils Relative: 0 %
Eosinophils Absolute: 0 10*3/uL (ref 0.0–0.5)
Eosinophils Relative: 0 %
HCT: 33.8 % — ABNORMAL LOW (ref 36.0–46.0)
Hemoglobin: 10.7 g/dL — ABNORMAL LOW (ref 12.0–15.0)
Immature Granulocytes: 1 %
Lymphocytes Relative: 16 %
Lymphs Abs: 2 10*3/uL (ref 0.7–4.0)
MCH: 26.4 pg (ref 26.0–34.0)
MCHC: 31.7 g/dL (ref 30.0–36.0)
MCV: 83.3 fL (ref 80.0–100.0)
Monocytes Absolute: 0.5 10*3/uL (ref 0.1–1.0)
Monocytes Relative: 4 %
Neutro Abs: 9.7 10*3/uL — ABNORMAL HIGH (ref 1.7–7.7)
Neutrophils Relative %: 79 %
Platelets: 185 10*3/uL (ref 150–400)
RBC: 4.06 MIL/uL (ref 3.87–5.11)
RDW: 16.3 % — ABNORMAL HIGH (ref 11.5–15.5)
WBC: 12.4 10*3/uL — ABNORMAL HIGH (ref 4.0–10.5)
nRBC: 0 % (ref 0.0–0.2)

## 2020-05-17 LAB — COMPREHENSIVE METABOLIC PANEL
ALT: 12 U/L (ref 0–44)
AST: 15 U/L (ref 15–41)
Albumin: 1.3 g/dL — ABNORMAL LOW (ref 3.5–5.0)
Alkaline Phosphatase: 95 U/L (ref 38–126)
Anion gap: 11 (ref 5–15)
BUN: 14 mg/dL (ref 8–23)
CO2: 22 mmol/L (ref 22–32)
Calcium: 8 mg/dL — ABNORMAL LOW (ref 8.9–10.3)
Chloride: 106 mmol/L (ref 98–111)
Creatinine, Ser: 0.47 mg/dL (ref 0.44–1.00)
GFR calc Af Amer: >60 mL/min (ref >60)
GFR calc non Af Amer: >60 mL/min (ref >60)
Glucose, Bld: 80 mg/dL (ref 70–99)
Potassium: 4.1 mmol/L (ref 3.5–5.1)
Sodium: 139 mmol/L (ref 135–145)
Total Bilirubin: 0.8 mg/dL (ref 0.3–1.2)
Total Protein: 5.8 g/dL — ABNORMAL LOW (ref 6.5–8.1)

## 2020-05-17 LAB — PROCALCITONIN: Procalcitonin: <0.1 ng/mL

## 2020-05-17 LAB — BRAIN NATRIURETIC PEPTIDE: B Natriuretic Peptide: 99 pg/mL (ref 0.0–100.0)

## 2020-05-17 LAB — C-REACTIVE PROTEIN: CRP: 18.8 mg/dL — ABNORMAL HIGH (ref <1.0)

## 2020-05-17 LAB — MAGNESIUM: Magnesium: 1.9 mg/dL (ref 1.7–2.4)

## 2020-05-17 MED ORDER — ORAL CARE MOUTH RINSE
15.0000 mL | Freq: Two times a day (BID) | OROMUCOSAL | Status: DC
  Administered 2020-05-17 – 2020-05-18 (×3): 15 mL via OROMUCOSAL

## 2020-05-17 MED ORDER — SODIUM CHLORIDE 0.9 % IV SOLN
INTRAVENOUS | Status: DC | PRN
  Administered 2020-05-17: 250 mL via INTRAVENOUS

## 2020-05-17 NOTE — Progress Notes (Signed)
Palliative Medicine RN Note: Rec'd request from PMT PA Clerance Lav to assist with referral to [facility], as daughter has a class at 1:00 and needs to talk to them before then. I spoke with Cassandra with [place] intake and provided her with the patient information.  Margret Chance Treesa Mccully, RN, BSN, [facility] Palliative Medicine Team 05/17/2020 11:10 AM Office [phone]

## 2020-05-17 NOTE — Progress Notes (Addendum)
Daily Progress Note   [name]       Date: 05/17/2020 DOB: 03/04/1946  Age: 74 y.o. MRN#: [id number] Attending Physician: Thurnell Lose, MD Primary Care Physician: Neale Burly, MD Admit Date: 05/13/2020  Reason for Consultation/Follow-up: To discuss complex medical decision making related to patient's goals of care  Subjective: Met Tracy Armstrong and talked to daughter Tracy Armstrong at bedside and then privately.  Patient reports she is comfortable.  She is very pleasantly demented.  She responds appropriately to my questions.  I ask how she feels about having surgery to remove the infection in her feet.  She says "I hope the don't take them".  I responded that the surgery would be to remove her feet as that is the only way to remove the infection that we fear may be life threatening.  Patient responded that she did not want surgery.  Tracy Armstrong and I talked at length and then Dr. Candiss Norse came to speak with [place] as well.  Tracy Armstrong stated that she is worried her mother will not heal after surgery and that the surgery will make the dementia worse.  I supported her statements as Tracy Armstrong only has an albumin of 1.3.  Tracy Armstrong states that her mother weighed 170 less than a year ago.  She has lost a tremendous amount of weight quickly and does not want to eat.    We discussed that her mother is not in pain now she is comfortable and happy.  Hospice will come to the house and tend to her wounds.  The infection will take her life in weeks to possibly months we don't know how long with any certainty.    Tracy Armstrong contacted her sister Tracy Armstrong and relayed our conversation along with the suggestion of hospice.  There does not seem to be a right answer - If the daughters go with surgery their mother will likely not heal and  have suffered the indignity and trauma of a bilateral amputation.  If they go with hospice they know the infection will eventually take their mother's life.     The daughters are comfortable avoiding surgery if that is the doctor's recommendation - however they have received different recommendations from different doctors.    After some discussion, I received a call back from [place] who asked  what the process was for taking her mother home with hospice.   Assessment: Very pleasantly demented female with contracted extremities who has osteomyelitis in her lower extremities.  Daughters have chosen to avoid surgery and take their mother home with hospice services and oral antibiotics.   Patient Profile/HPI:  Per intake H&P --> Tracy Armstrong a 74 y.o.femalewith medical history significant ofmultiple sclerosis, dementia, hypertension and bedbound; who presented to emergency departmentsecondary to worsening left foot infection, decrease appetite and worsening confusion. Patient's daughter reports over the last month mom has been actively declining, eating less and with worsening drainage and appearance in her chronic left foot wound. No fever, no chills, no nausea, no vomiting, no CP, no diarrhea, no hematuria, no dysuria, no melena or any other complaints.    Length of Stay: 4   Vital Signs: BP 112/69 (BP Location: Right Arm)    Pulse 75    Temp 98 F (36.7 C) (Axillary)    Resp 14    Ht '5\' 6"'$  (1.676 m)    Wt 63.2 kg    SpO2 100%    BMI 22.49 kg/m  SpO2: SpO2: 100 % O2 Device: O2 Device: Room Air O2 Flow Rate:         Palliative Assessment/Data:  20%     Palliative Care Plan    Recommendations/Plan:  Home with oral antibiotics and Hospice care.  Patient will need wound care and a special mattress due to wounds.  Code Status:  DNR  Prognosis:  Likely weeks to months given osteomyelitis, in the setting of advanced dementia, poor PO intake, and long standing multiple  sclerosis.  Discharge Planning:  Home with Hospice  Care plan was discussed with MD, family, [facility].  Thank you for allowing the Palliative Medicine Team to assist in the care of this patient.  Total time spent:  45 min.     Greater than 50%  of this time was spent counseling and coordinating care related to the above assessment and plan.  Florentina Jenny, PA-C Palliative Medicine  Please contact Palliative MedicineTeam phone at [phone] for questions and concerns between 7 am - 7 pm.   Please see AMION for individual provider pager numbers.

## 2020-05-17 NOTE — Progress Notes (Signed)
PROGRESS NOTE                                                                                                                                                                                                             Patient Demographics:    Tracy Armstrong, is a 74 y.o. female, DOB - 1945/12/29, ZOX:[id number]  Admit date - 05/13/2020   Admitting Physician Vassie Loll, MD  Outpatient Primary MD for the patient is [name], MD  LOS - 4  Chief Complaint  Patient presents with  . Failure To Thrive       Brief Narrative -  Tracy Armstrong is a 74 y.o. female with medical history significant of multiple sclerosis, dementia, hypertension and bedbound; who presented to [facility] emergency department secondary to worsening left foot infection, decrease appetite and worsening confusion. Patient's daughter reports over the last month mom has been actively declining, eating less and with worsening drainage and appearance in her chronic left foot wound, other work-up showed possible osteomyelitis in the left foot and she was sent to [facility] for further care.   Subjective:   Patient in bed, appears comfortable, denies any headache, no fever, no chest pain or pressure, no shortness of breath , no abdominal pain. No focal weakness.    Assessment  & Plan :    1. Sepsis due to left foot cellulitis along with osteomyelitis chronic. On empiric IV antibiotics, has been hydrated and sepsis pathophysiology has resolved, and out of 2 blood cultures positive for coag negative staph at [facility], orthopedics saw the patient and initially thought about bilateral BKA however after detailed discussion with palliative care, family it was decided that patient will be best served by being discharged with home hospice on oral antibiotics and goal of treatment being directed towards comfort.  Likely discharge on 05/18/2020.  Recent Labs  Lab 05/13/20 1145 05/13/20 1851 05/14/20 0756  05/15/20 0750 05/16/20 0659 05/17/20 0435  WBC 22.0*  --  18.2* 17.2* 11.7* 12.4*  PLT 192  --  173 199 165 185  CRP  --   --  20.9* 18.2* 19.1* 18.8*  PROCALCITON  --   --  0.29 0.16 <0.10 <0.10  LATICACIDVEN 2.8* <0.3*  --   --   --   --      2. Chronic deconditioning, cachexia, multiple sclerosis. She is bedbound, severe contractures in all 4 extremities especially lower extremities, continue supportive care.  Long discussion with patient's daughters  on 05/14/2020, DNR, continue medical treatment, gentle medical treatment directed towards comfort now.    3. Multiple unstageable decubitus ulcers and pressure wounds present on admission. Kindly see nursing note. Wound care consulted.  4. History of dementia. Continue Aricept and supportive care. At risk for delirium. Minimize narcotics and benzodiazepines.  5. Hypertension. Blood pressure currently soft, 2 units packed RBC on 05/15/2020 and monitor.  6. Severe PCM and deconditioning. Nutritionist consult, protein supplementations, PT OT.  7.  Hypokalemia.  Replaced will monitor.    8. Severe chronic microcytic anemia worse with heme dilution due to IV fluids.  Signs of ongoing active bleeding, PPI, iron and folic acid orally, 2 units of packed RBC on 05/15/2020, monitor.  Not a candidate for invasive testing or procedures.   Pressure Injury 05/13/20 Buttocks Medial;Bilateral Unstageable - Full thickness tissue loss in which the base of the injury is covered by slough (yellow, tan, gray, green or brown) and/or eschar (tan, brown or black) in the wound bed. (Active)  05/13/20 1030  Location: Buttocks  Location Orientation: Medial;Bilateral  Staging: Unstageable - Full thickness tissue loss in which the base of the injury is covered by slough (yellow, tan, gray, green or brown) and/or eschar (tan, brown or black) in the wound bed.  Wound Description (Comments):   Present on Admission: Yes     Pressure Injury 05/13/20 Ankle Right  Unstageable - Full thickness tissue loss in which the base of the injury is covered by slough (yellow, tan, gray, green or brown) and/or eschar (tan, brown or black) in the wound bed. (Active)  05/13/20 1030  Location: Ankle  Location Orientation: Right  Staging: Unstageable - Full thickness tissue loss in which the base of the injury is covered by slough (yellow, tan, gray, green or brown) and/or eschar (tan, brown or black) in the wound bed.  Wound Description (Comments):   Present on Admission: Yes     Pressure Injury 05/13/20 Ankle Left Unstageable - Full thickness tissue loss in which the base of the injury is covered by slough (yellow, tan, gray, green or brown) and/or eschar (tan, brown or black) in the wound bed. (Active)  05/13/20 1030  Location: Ankle  Location Orientation: Left  Staging: Unstageable - Full thickness tissue loss in which the base of the injury is covered by slough (yellow, tan, gray, green or brown) and/or eschar (tan, brown or black) in the wound bed.  Wound Description (Comments):   Present on Admission: Yes     Pressure Injury 05/13/20 Knee Right Unstageable - Full thickness tissue loss in which the base of the injury is covered by slough (yellow, tan, gray, green or brown) and/or eschar (tan, brown or black) in the wound bed. (Active)  05/13/20 1030  Location: Knee  Location Orientation: Right  Staging: Unstageable - Full thickness tissue loss in which the base of the injury is covered by slough (yellow, tan, gray, green or brown) and/or eschar (tan, brown or black) in the wound bed.  Wound Description (Comments):   Present on Admission:     Condition -   Guarded  Family Communication  : daughter Tracy Armstrong - [phone] will be called on 05/15/2020 at 6pm, detailed prognosis discussion, DNR, full Med Rx, no heroics.  Updated again on 05/15/2020, 05/16/20, 05/17/2020 bedside.  Code Status : DNR now  Consults  :  Ortho, Pall Care  Procedures  :    TTE  MRI  L.Foot -  1. Deep soft tissue ulceration along the medial  aspect of the ankle with ulcer base extending to the medial cortex of the medial malleolus and talus. Associated signal changes within the peripheral aspects of the medial malleolus and medial talus compatible with acute osteomyelitis. 2. Trace tibiotalar joint effusion with enhancing synovium concerning for septic arthritis. There appears to be air or gas within the tibiotalar joint, possibly extending from adjacent ulceration. 3. Trace talonavicular joint effusion with marrow edema and cortical irregularity along both sides of the joint suspicious for septic arthritis and osteomyelitis. 4. Similar findings at the calcaneocuboid joint also suspicious for septic arthritis and osteomyelitis. 5. Additional ulceration over the at the posterior hindfoot with acute osteomyelitis of the posterior calcaneus. 6. Marrow edema within the residual great toe distal phalanx which may reflect osteitis versus early osteomyelitis. 7. Extensive cellulitis with numerous foci of susceptibility tracking within the soft tissues of the lateral forefoot concerning for gas-forming infection. No drainable fluid collection. 8. Tendinous and ligamentous abnormalities, as above  PUD Prophylaxis : PPI  Disposition Plan  :    Status is: Inpatient  Remains inpatient appropriate because:IV treatments appropriate due to intensity of illness or inability to take PO and Inpatient level of care appropriate due to severity of illness   Dispo: The patient is from: Home               Anticipated d/c is to: Home with Hospice              Anticipated d/c date is: 1 days              Patient currently is medically stable to d/c.   DVT Prophylaxis  :   Heparin  Lab Results  Component Value Date   PLT 185 05/17/2020    Diet :  Diet Order            DIET DYS 3 Room service appropriate? Yes; Fluid consistency: Thin  Diet effective now                  Inpatient  Medications Scheduled Meds: . (feeding supplement) PROSource Plus  30 mL Oral Daily  . docusate sodium  200 mg Oral BID  . donepezil  10 mg Oral QHS  . feeding supplement (ENSURE ENLIVE)  237 mL Oral BID BM  . ferrous sulfate  325 mg Oral BID WC  . folic acid  1 mg Oral Daily  . heparin  5,000 Units Subcutaneous Q8H  . mouth rinse  15 mL Mouth Rinse BID  . mirtazapine  7.5 mg Oral QHS  . pantoprazole  40 mg Oral Daily   Continuous Infusions: . sodium chloride 10 mL/hr at 05/17/20 0700  . ceFEPime (MAXIPIME) IV Stopped (05/17/20 5027)  . vancomycin Stopped (05/16/20 1916)   PRN Meds:.sodium chloride, acetaminophen **OR** [DISCONTINUED] acetaminophen, [DISCONTINUED] ondansetron **OR** ondansetron (ZOFRAN) IV  Antibiotics  :   Anti-infectives (From admission, onward)   Start     Dose/Rate Route Frequency Ordered Stop   05/16/20 1800  ceFEPIme (MAXIPIME) 2 g in sodium chloride 0.9 % 100 mL IVPB     Discontinue     2 g 200 mL/hr over 30 Minutes Intravenous Every 12 hours 05/16/20 1434     05/16/20 0200  piperacillin-tazobactam (ZOSYN) IVPB 3.375 g  Status:  Discontinued        3.375 g 12.5 mL/hr over 240 Minutes Intravenous Every 8 hours 05/15/20 2042 05/16/20 1134   05/14/20 1400  vancomycin (VANCOREADY) IVPB 750 mg/150 mL  Discontinue     750 mg 150 mL/hr over 60 Minutes Intravenous Every 24 hours 05/13/20 1234     05/13/20 2200  piperacillin-tazobactam (ZOSYN) IVPB 3.375 g  Status:  Discontinued        3.375 g 12.5 mL/hr over 240 Minutes Intravenous Every 8 hours 05/13/20 1236 05/15/20 2042   05/13/20 1130  piperacillin-tazobactam (ZOSYN) IVPB 3.375 g  Status:  Discontinued        3.375 g 100 mL/hr over 30 Minutes Intravenous  Once 05/13/20 1115 05/13/20 1751   05/13/20 1130  clindamycin (CLEOCIN) IVPB 600 mg  Status:  Discontinued        600 mg 100 mL/hr over 30 Minutes Intravenous  Once 05/13/20 1123 05/13/20 1751   05/13/20 1100  vancomycin (VANCOREADY) IVPB 1500 mg/300  mL        1,500 mg 150 mL/hr over 120 Minutes Intravenous  Once 05/13/20 1041 05/13/20 1811   05/13/20 1045  vancomycin (VANCOCIN) IVPB 1000 mg/200 mL premix  Status:  Discontinued        1,000 mg 200 mL/hr over 60 Minutes Intravenous  Once 05/13/20 1034 05/13/20 1040   05/13/20 1045  cefTRIAXone (ROCEPHIN) 2 g in sodium chloride 0.9 % 100 mL IVPB  Status:  Discontinued        2 g 200 mL/hr over 30 Minutes Intravenous  Once 05/13/20 1034 05/13/20 1113          Objective:   Vitals:   05/16/20 2000 05/16/20 2200 05/17/20 0000 05/17/20 0400  BP: 109/66  95/63 112/69  Pulse: 96  91 75  Resp: 18 15 14 14   Temp: 97.9 F (36.6 C)   98 F (36.7 C)  TempSrc: Axillary   Axillary  SpO2: 98%  100% 100%  Weight:      Height:        SpO2: 100 %  Wt Readings from Last 3 Encounters:  05/13/20 63.2 kg  07/19/11 59.9 kg     Intake/Output Summary (Last 24 hours) at 05/17/2020 1138 Last data filed at 05/17/2020 0700 Gross per 24 hour  Intake 615.03 ml  Output 300 ml  Net 315.03 ml     Physical Exam  Awake Alert overall, minimally confused at times, extremely weak lower extremities from advanced MS, severely weak and cachectic, [place].AT,PERRAL Supple Neck,No JVD, No cervical lymphadenopathy appriciated.  Symmetrical Chest wall movement, Good air movement bilaterally, CTAB RRR,No Gallops, Rubs or new Murmurs, No Parasternal Heave +ve B.Sounds, Abd Soft, No tenderness, No organomegaly appriciated, No rebound - guarding or rigidity. Severe chronic lower extremity contractures, mild contractures in upper extremity right hand more than left, both feet under dressing    Pressure Injury 05/13/20 Buttocks Medial;Bilateral Unstageable - Full thickness tissue loss in which the base of the injury is covered by slough (yellow, tan, gray, green or brown) and/or eschar (tan, brown or black) in the wound bed. (Active)  05/13/20 1030  Location: Buttocks  Location Orientation: Medial;Bilateral    Staging: Unstageable - Full thickness tissue loss in which the base of the injury is covered by slough (yellow, tan, gray, green or brown) and/or eschar (tan, brown or black) in the wound bed.  Wound Description (Comments):   Present on Admission: Yes     Pressure Injury 05/13/20 Ankle Right Unstageable - Full thickness tissue loss in which the base of the injury is covered by slough (yellow, tan, gray, green or brown) and/or eschar (tan, brown or black) in the wound bed. (Active)  05/13/20  1030  Location: Ankle  Location Orientation: Right  Staging: Unstageable - Full thickness tissue loss in which the base of the injury is covered by slough (yellow, tan, gray, green or brown) and/or eschar (tan, brown or black) in the wound bed.  Wound Description (Comments):   Present on Admission: Yes     Pressure Injury 05/13/20 Ankle Left Unstageable - Full thickness tissue loss in which the base of the injury is covered by slough (yellow, tan, gray, green or brown) and/or eschar (tan, brown or black) in the wound bed. (Active)  05/13/20 1030  Location: Ankle  Location Orientation: Left  Staging: Unstageable - Full thickness tissue loss in which the base of the injury is covered by slough (yellow, tan, gray, green or brown) and/or eschar (tan, brown or black) in the wound bed.  Wound Description (Comments):   Present on Admission: Yes     Pressure Injury 05/13/20 Knee Right Unstageable - Full thickness tissue loss in which the base of the injury is covered by slough (yellow, tan, gray, green or brown) and/or eschar (tan, brown or black) in the wound bed. (Active)  05/13/20 1030  Location: Knee  Location Orientation: Right  Staging: Unstageable - Full thickness tissue loss in which the base of the injury is covered by slough (yellow, tan, gray, green or brown) and/or eschar (tan, brown or black) in the wound bed.  Wound Description (Comments):   Present on Admission:      Data Review:    Recent  Labs  Lab 05/13/20 1145 05/14/20 0756 05/15/20 0750 05/16/20 0659 05/17/20 0435  WBC 22.0* 18.2* 17.2* 11.7* 12.4*  HGB 8.4* 7.2* 6.2* 10.0* 10.7*  HCT 29.9* 24.1* 21.5* 31.4* 33.8*  PLT 192 173 199 165 185  MCV 88.5 81.1 83.0 82.2 83.3  MCH 24.9* 24.2* 23.9* 26.2 26.4  MCHC 28.1* 29.9* 28.8* 31.8 31.7  RDW 17.5* 17.4* 17.6* 15.9* 16.3*  LYMPHSABS 1.6  --  2.6 1.8 2.0  MONOABS 1.0  --  0.9 0.7 0.5  EOSABS 0.0  --  0.1 0.1 0.0  BASOSABS 0.0  --  0.0 0.0 0.0    Recent Labs  Lab 05/13/20 1145 05/13/20 1849 05/14/20 0756 05/15/20 0750 05/16/20 0659 05/17/20 0435  NA 135  --  138 140 142 139  K 3.9  --  3.5 3.5 3.1* 4.1  CL 103  --  107 108 108 106  CO2 21*  --  23 23 22 22   GLUCOSE 122*  --  81 77 84 80  BUN 20  --  16 12 14 14   CREATININE 0.55  --  0.55 0.50 0.58 0.47  CALCIUM 8.3*  --  8.1* 7.9* 7.6* 8.0*  AST 13*  --   --  11* 10* 15  ALT 9  --   --  8 9 12   ALKPHOS 135*  --   --  94 86 95  BILITOT 0.5  --   --  0.2* 0.9 0.8  ALBUMIN 2.0*  --   --  1.3* 1.3* 1.3*  MG  --  1.9 1.8 1.8 1.8 1.9  CRP  --   --  20.9* 18.2* 19.1* 18.8*  PROCALCITON  --   --  0.29 0.16 <0.10 <0.10  INR 1.3*  --   --   --   --   --   TSH  --  0.803  --   --   --   --   BNP  --   --  812.7* 149.8* 225.9* 99.0    Recent Labs  Lab 05/13/20 1059 05/14/20 0756 05/15/20 0750 05/16/20 0659 05/17/20 0435  CRP  --  20.9* 18.2* 19.1* 18.8*  BNP  --  812.7* 149.8* 225.9* 99.0  PROCALCITON  --  0.29 0.16 <0.10 <0.10  SARSCOV2NAA NEGATIVE  --   --   --   --     ------------------------------------------------------------------------------------------------------------------ No results for input(s): CHOL, HDL, LDLCALC, TRIG, CHOLHDL, LDLDIRECT in the last 72 hours.  No results found for: HGBA1C ------------------------------------------------------------------------------------------------------------------ No results for input(s): TSH, T4TOTAL, T3FREE, THYROIDAB in the last 72  hours.  Invalid input(s): FREET3 ------------------------------------------------------------------------------------------------------------------ Recent Labs    05/14/20 1409  VITAMINB12 633  FOLATE 13.3  FERRITIN 921*  TIBC 102*  IRON 13*  RETICCTPCT 1.3    Coagulation profile Recent Labs  Lab 05/13/20 1145  INR 1.3*    No results for input(s): DDIMER in the last 72 hours.  Cardiac Enzymes No results for input(s): CKMB, TROPONINI, MYOGLOBIN in the last 168 hours.  Invalid input(s): CK ------------------------------------------------------------------------------------------------------------------    Component Value Date/Time   BNP 99.0 05/17/2020 0435    Micro Results Recent Results (from the past 240 hour(s))  SARS Coronavirus 2 by RT PCR (hospital order, performed in [facility] hospital lab) Nasopharyngeal Nasopharyngeal Swab     Status: None   Collection Time: 05/13/20 10:59 AM   Specimen: Nasopharyngeal Swab  Result Value Ref Range Status   SARS Coronavirus 2 NEGATIVE NEGATIVE Final    Comment: (NOTE) SARS-CoV-2 target nucleic acids are NOT DETECTED.  The SARS-CoV-2 RNA is generally detectable in upper and lower respiratory specimens during the acute phase of infection. The lowest concentration of SARS-CoV-2 viral copies this assay can detect is 250 copies / mL. A negative result does not preclude SARS-CoV-2 infection and should not be used as the sole basis for treatment or other patient management decisions.  A negative result may occur with improper specimen collection / handling, submission of specimen other than nasopharyngeal swab, presence of viral mutation(s) within the areas targeted by this assay, and inadequate number of viral copies (<250 copies / mL). A negative result must be combined with clinical observations, patient history, and epidemiological information.  Fact Sheet for Patients:   BoilerBrush.com.cy  Fact  Sheet for Healthcare Providers: https://pope.com/  This test is not yet approved or  cleared by the [place] FDA and has been authorized for detection and/or diagnosis of SARS-CoV-2 by FDA under an Emergency Use Authorization (EUA).  This EUA will remain in effect (meaning this test can be used) for the duration of the COVID-19 declaration under Section 564(b)(1) of the Act, 21 U.S.C. section 360bbb-3(b)(1), unless the authorization is terminated or revoked sooner.  Performed at [facility], [address], [place], [place] [address]   Blood Culture (routine x 2)     Status: Abnormal   Collection Time: 05/13/20 11:45 AM   Specimen: BLOOD RIGHT HAND  Result Value Ref Range Status   Specimen Description BLOOD RIGHT HAND  Final   Special Requests NONE  Final   Culture  Setup Time   Final    GRAM POSITIVE COCCI AEROBIC BOTTLE ONLY Gram Stain Report Called to,Read Back By and Verified With: MICHELLE MOORE,RN @0831  05/15/2020 KAY CRITICAL RESULT CALLED TO, READ BACK BY AND VERIFIED WITH: PHARMD K PIERCE [id number]  AT 1335 BY CM    Culture (A)  Final    STAPHYLOCOCCUS SPECIES (COAGULASE NEGATIVE) THE SIGNIFICANCE OF ISOLATING THIS ORGANISM FROM A  SINGLE SET OF BLOOD CULTURES WHEN MULTIPLE SETS ARE DRAWN IS UNCERTAIN. PLEASE NOTIFY THE MICROBIOLOGY DEPARTMENT WITHIN ONE WEEK IF SPECIATION AND SENSITIVITIES ARE REQUIRED. Performed at [facility], [address], [place], [place] [address]    Report Status 05/16/2020 FINAL  Final  Blood Culture (routine x 2)     Status: None (Preliminary result)   Collection Time: 05/13/20 11:45 AM   Specimen: BLOOD  Result Value Ref Range Status   Specimen Description BLOOD RIGHT ANTECUBITAL  Final   Special Requests   Final    BOTTLES DRAWN AEROBIC AND ANAEROBIC Blood Culture adequate volume   Culture   Final    NO GROWTH 4 DAYS Performed at [facility], [address] [place], [place] [address]    Report Status  PENDING  Incomplete  Blood Culture ID Panel (Reflexed)     Status: Abnormal   Collection Time: 05/13/20 11:45 AM  Result Value Ref Range Status   Enterococcus species NOT DETECTED NOT DETECTED Final   Listeria monocytogenes NOT DETECTED NOT DETECTED Final   Staphylococcus species DETECTED (A) NOT DETECTED Final    Comment: Methicillin (oxacillin) susceptible coagulase negative staphylococcus. Possible blood culture contaminant (unless isolated from more than one blood culture draw or clinical case suggests pathogenicity). No antibiotic treatment is indicated for blood  culture contaminants. CRITICAL RESULT CALLED TO, READ BACK BY AND VERIFIED WITH: PHARMD K PIERCE [id number] AT 1335 BY CM    Staphylococcus aureus (BCID) NOT DETECTED NOT DETECTED Final   Methicillin resistance NOT DETECTED NOT DETECTED Final   Streptococcus species NOT DETECTED NOT DETECTED Final   Streptococcus agalactiae NOT DETECTED NOT DETECTED Final   Streptococcus pneumoniae NOT DETECTED NOT DETECTED Final   Streptococcus pyogenes NOT DETECTED NOT DETECTED Final   Acinetobacter baumannii NOT DETECTED NOT DETECTED Final   Enterobacteriaceae species NOT DETECTED NOT DETECTED Final   Enterobacter cloacae complex NOT DETECTED NOT DETECTED Final   Escherichia coli NOT DETECTED NOT DETECTED Final   Klebsiella oxytoca NOT DETECTED NOT DETECTED Final   Klebsiella pneumoniae NOT DETECTED NOT DETECTED Final   Proteus species NOT DETECTED NOT DETECTED Final   Serratia marcescens NOT DETECTED NOT DETECTED Final   Haemophilus influenzae NOT DETECTED NOT DETECTED Final   Neisseria meningitidis NOT DETECTED NOT DETECTED Final   Pseudomonas aeruginosa NOT DETECTED NOT DETECTED Final   Candida albicans NOT DETECTED NOT DETECTED Final   Candida glabrata NOT DETECTED NOT DETECTED Final   Candida krusei NOT DETECTED NOT DETECTED Final   Candida parapsilosis NOT DETECTED NOT DETECTED Final   Candida tropicalis NOT DETECTED NOT DETECTED  Final    Comment: Performed at [facility], [address], [place], [place] [address]  MRSA PCR Screening     Status: None   Collection Time: 05/15/20 10:17 AM   Specimen: Nasal Mucosa; Nasopharyngeal  Result Value Ref Range Status   MRSA by PCR NEGATIVE NEGATIVE Final    Comment:        The GeneXpert MRSA Assay (FDA approved for NASAL specimens only), is one component of a comprehensive MRSA colonization surveillance program. It is not intended to diagnose MRSA infection nor to guide or monitor treatment for MRSA infections. Performed at [facility], [address] [place], [place] [address]     Radiology Reports DG Ankle 2 Views Right  Result Date: 05/13/2020 CLINICAL DATA:  Pressure wound. EXAM: RIGHT ANKLE - 2 VIEW COMPARISON:  No recent prior. FINDINGS: Large soft tissue  wound noted over the medial aspect of the right ankle. No radiopaque foreign body. No underlying bony erosion. No evidence of fracture or dislocation. Diffuse osteopenia and degenerative change. IMPRESSION: 1. Large soft tissue wound noted over the medial aspect of the right ankle. No radiopaque foreign body. No underlying bony erosion. 2. No acute bony abnormality. Diffuse osteopenia and degenerative change. Electronically Signed   By: Maisie Fus  Register   On: 05/13/2020 12:33   MR FOOT LEFT W WO CONTRAST  Result Date: 05/13/2020 CLINICAL DATA:  Nonhealing pressure all ulcers of the left foot and ankle EXAM: MRI OF THE LEFT FOREFOOT WITHOUT AND WITH CONTRAST; MRI OF THE LEFT ANKLE WITHOUT AND WITH CONTRAST TECHNIQUE: Multiplanar, multisequence MR imaging of the left foot and ankle was performed both before and after administration of intravenous contrast. CONTRAST:  45mL GADAVIST GADOBUTROL 1 MMOL/ML IV SOLN COMPARISON:  X-ray 05/13/2020 FINDINGS: Bones: There is a deep soft tissue ulceration along the medial aspect of the ankle with ulcer base closely approximating the medial cortex of the medial  malleolus and talus. Associated bone marrow edema and enhancement within the peripheral aspect of the medial malleolus and medial talus with loss of cortical definition and low T1 bone marrow signal compatible with acute osteomyelitis (series 5 and 6, image 10). Susceptibility within the tibiotalar joint space suggesting air tracking intra-articularly. Trace tibiotalar joint effusion with enhancing synovium concerning for septic arthritis. Trace talonavicular joint effusion with marrow edema and cortical irregularity along both sides of the joint (series 8 and 9, image 12). Findings are suspicious for septic arthritis and osteomyelitis. Similar findings are present at the calcaneocuboid joint with small joint effusion and associated subchondral marrow edema (series 9, image 17). Marrow edema within the residual great toe distal phalanx (series 9, image 10) raises suspicion for osteomyelitis. There is evidence of previous resection of the distal tuft. Additional ulceration over the posterior calcaneus with marrow edema and low T1 signal along the peripheral margin of the posterior calcaneus suspicious for osteomyelitis (series 1 and 2, image 11). Tendons: The posterior tibialis and flexor digitorum longus tendons extend into the area of ulceration at the lateral ankle and are not well delineated, and may be torn. Flexor hallucis longus tendon intact. Tendinosis of the distal Achilles tendon with poor delineation of the distal insertion at site of likely pressure ulceration. Peroneal tendons appear intact. Ligaments: LisFranc ligament intact. Deltoid and spring ligament complex are poorly evaluated at site of ulceration, likely torn. Lateral ankle tendons grossly intact. Soft tissues: Deep soft tissue ulceration at the lateral ankle, as described above. Extensive surrounding soft tissue edema and enhancement compatible with cellulitis. No drainable fluid collection. Extensive pressure ulceration at the posterior  hindfoot. There are extensive foci of susceptibility tracking within the soft tissues of the lateral forefoot (series 8, images 22-23). Additional areas of more superficial ulceration along the plantar aspect of the midfoot and forefoot with surrounding cellulitis. IMPRESSION: 1. Deep soft tissue ulceration along the medial aspect of the ankle with ulcer base extending to the medial cortex of the medial malleolus and talus. Associated signal changes within the peripheral aspects of the medial malleolus and medial talus compatible with acute osteomyelitis. 2. Trace tibiotalar joint effusion with enhancing synovium concerning for septic arthritis. There appears to be air or gas within the tibiotalar joint, possibly extending from adjacent ulceration. 3. Trace talonavicular joint effusion with marrow edema and cortical irregularity along both sides of the joint suspicious for septic arthritis and osteomyelitis. 4. Similar  findings at the calcaneocuboid joint also suspicious for septic arthritis and osteomyelitis. 5. Additional ulceration over the at the posterior hindfoot with acute osteomyelitis of the posterior calcaneus. 6. Marrow edema within the residual great toe distal phalanx which may reflect osteitis versus early osteomyelitis. 7. Extensive cellulitis with numerous foci of susceptibility tracking within the soft tissues of the lateral forefoot concerning for gas-forming infection. No drainable fluid collection. 8. Tendinous and ligamentous abnormalities, as above. These results will be called to the ordering clinician or representative by the Radiologist Assistant, and communication documented in the PACS or [employer]. Electronically Signed   By: Duanne Guess D.O.   On: 05/13/2020 15:56   MR ANKLE LEFT W WO CONTRAST  Result Date: 05/13/2020 CLINICAL DATA:  Nonhealing pressure all ulcers of the left foot and ankle EXAM: MRI OF THE LEFT FOREFOOT WITHOUT AND WITH CONTRAST; MRI OF THE LEFT ANKLE  WITHOUT AND WITH CONTRAST TECHNIQUE: Multiplanar, multisequence MR imaging of the left foot and ankle was performed both before and after administration of intravenous contrast. CONTRAST:  83mL GADAVIST GADOBUTROL 1 MMOL/ML IV SOLN COMPARISON:  X-ray 05/13/2020 FINDINGS: Bones: There is a deep soft tissue ulceration along the medial aspect of the ankle with ulcer base closely approximating the medial cortex of the medial malleolus and talus. Associated bone marrow edema and enhancement within the peripheral aspect of the medial malleolus and medial talus with loss of cortical definition and low T1 bone marrow signal compatible with acute osteomyelitis (series 5 and 6, image 10). Susceptibility within the tibiotalar joint space suggesting air tracking intra-articularly. Trace tibiotalar joint effusion with enhancing synovium concerning for septic arthritis. Trace talonavicular joint effusion with marrow edema and cortical irregularity along both sides of the joint (series 8 and 9, image 12). Findings are suspicious for septic arthritis and osteomyelitis. Similar findings are present at the calcaneocuboid joint with small joint effusion and associated subchondral marrow edema (series 9, image 17). Marrow edema within the residual great toe distal phalanx (series 9, image 10) raises suspicion for osteomyelitis. There is evidence of previous resection of the distal tuft. Additional ulceration over the posterior calcaneus with marrow edema and low T1 signal along the peripheral margin of the posterior calcaneus suspicious for osteomyelitis (series 1 and 2, image 11). Tendons: The posterior tibialis and flexor digitorum longus tendons extend into the area of ulceration at the lateral ankle and are not well delineated, and may be torn. Flexor hallucis longus tendon intact. Tendinosis of the distal Achilles tendon with poor delineation of the distal insertion at site of likely pressure ulceration. Peroneal tendons appear  intact. Ligaments: LisFranc ligament intact. Deltoid and spring ligament complex are poorly evaluated at site of ulceration, likely torn. Lateral ankle tendons grossly intact. Soft tissues: Deep soft tissue ulceration at the lateral ankle, as described above. Extensive surrounding soft tissue edema and enhancement compatible with cellulitis. No drainable fluid collection. Extensive pressure ulceration at the posterior hindfoot. There are extensive foci of susceptibility tracking within the soft tissues of the lateral forefoot (series 8, images 22-23). Additional areas of more superficial ulceration along the plantar aspect of the midfoot and forefoot with surrounding cellulitis. IMPRESSION: 1. Deep soft tissue ulceration along the medial aspect of the ankle with ulcer base extending to the medial cortex of the medial malleolus and talus. Associated signal changes within the peripheral aspects of the medial malleolus and medial talus compatible with acute osteomyelitis. 2. Trace tibiotalar joint effusion with enhancing synovium concerning for septic arthritis. There  appears to be air or gas within the tibiotalar joint, possibly extending from adjacent ulceration. 3. Trace talonavicular joint effusion with marrow edema and cortical irregularity along both sides of the joint suspicious for septic arthritis and osteomyelitis. 4. Similar findings at the calcaneocuboid joint also suspicious for septic arthritis and osteomyelitis. 5. Additional ulceration over the at the posterior hindfoot with acute osteomyelitis of the posterior calcaneus. 6. Marrow edema within the residual great toe distal phalanx which may reflect osteitis versus early osteomyelitis. 7. Extensive cellulitis with numerous foci of susceptibility tracking within the soft tissues of the lateral forefoot concerning for gas-forming infection. No drainable fluid collection. 8. Tendinous and ligamentous abnormalities, as above. These results will be called to  the ordering clinician or representative by the Radiologist Assistant, and communication documented in the PACS or [employer]. Electronically Signed   By: Duanne Guess D.O.   On: 05/13/2020 15:56   DG Chest Port 1 View  Result Date: 05/13/2020 CLINICAL DATA:  Sepsis EXAM: PORTABLE CHEST 1 VIEW COMPARISON:  Portable exam 1041 hours without priors for comparison FINDINGS: Normal heart size, mediastinal contours, and pulmonary vascularity. Lungs clear. No pulmonary infiltrate, pleural effusion or pneumothorax. Bones demineralized. IMPRESSION: No acute abnormalities. Electronically Signed   By: Ulyses Southward M.D.   On: 05/13/2020 11:05   DG Foot Complete Left  Result Date: 05/13/2020 CLINICAL DATA:  Multiple nonhealing pressure ulcers, sepsis EXAM: LEFT FOOT - COMPLETE 4 VIEW COMPARISON:  None FINDINGS: Nonstandard positioning due to patient condition. Marked osseous demineralization. Diffuse soft tissue swelling LEFT foot with foci of soft tissue gas at the plantar aspect of the foot at the level of the fourth and fifth metatarsals. This could be due to open wound or infection by a gas-forming organism. 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 Advanced degenerative changes at the talonavicular joint with superior navicular subluxation, could represent inflammatory arthropathy or septic arthritis. Probable ankle joint effusion. Additional degenerative changes at the naviculocuneiform joint and at the calcaneocuboid joint. No acute fracture, dislocation, or additional bone destruction. Lucency projecting over ankle joint on lateral view likely represents an additional ulcer though this is less well localized due to nonstandard positioning. IMPRESSION: Soft tissue gas at plantar aspect of lateral LEFT foot at the level of the fourth and fifth metatarsals either due to open wound or infection by a gas-forming organism. Osseous demineralization with scattered degenerative changes  as well as destructive changes and subluxation at the talonavicular joint, could represent inflammatory arthropathy but is concerning for septic arthritis. Suspected ankle joint effusion. Findings called to Harlene Salts PA on 05/13/2020 at 1110 hours. Electronically Signed   By: Ulyses Southward M.D.   On: 05/13/2020 11:12   ECHOCARDIOGRAM COMPLETE  Result Date: 05/16/2020    ECHOCARDIOGRAM REPORT   Patient Name:   LASHUNA TAMASHIRO Date of Exam: 05/16/2020 Medical Rec #:  [id number]      Height:       66.0 in Accession #:    [id number]     Weight:       139.3 lb Date of Birth:  07-04-1946      BSA:          1.715 m Patient Age:    74 years       BP:           89/55 mmHg Patient Gender: F              HR:  97 bpm. Exam Location:  Inpatient Procedure: 2D Echo, Cardiac Doppler and Color Doppler Indications:    I50.33 Acute on chronic diastolic (congestive) heart failure  History:        Patient has no prior history of Echocardiogram examinations.                 Multiple Sclerosis.  Sonographer:    Elmarie Shiley Dance Referring Phys: Heide Scales [facility]  Sonographer Comments: Suboptimal subcostal window and Technically difficult study due to poor echo windows. IMPRESSIONS  1. Left ventricular ejection fraction, by estimation, is 60 to 65%. The left ventricle has normal function. The left ventricle has no regional wall motion abnormalities. Left ventricular diastolic parameters are consistent with Grade I diastolic dysfunction (impaired relaxation).  2. Right ventricular systolic function is normal. The right ventricular size is normal.  3. The mitral valve is normal in structure. Trivial mitral valve regurgitation. No evidence of mitral stenosis.  4. The aortic valve is tricuspid. Aortic valve regurgitation is not visualized. No aortic stenosis is present.  5. Aortic dilatation noted. There is mild dilatation of the aortic root measuring 39 mm. FINDINGS  Left Ventricle: Left ventricular ejection fraction, by  estimation, is 60 to 65%. The left ventricle has normal function. The left ventricle has no regional wall motion abnormalities. The left ventricular internal cavity size was normal in size. There is  no left ventricular hypertrophy. Left ventricular diastolic parameters are consistent with Grade I diastolic dysfunction (impaired relaxation). Right Ventricle: The right ventricular size is normal. Right ventricular systolic function is normal. Left Atrium: Left atrial size was normal in size. Right Atrium: Right atrial size was normal in size. Pericardium: There is no evidence of pericardial effusion. Mitral Valve: The mitral valve is normal in structure. Normal mobility of the mitral valve leaflets. Trivial mitral valve regurgitation. No evidence of mitral valve stenosis. Tricuspid Valve: The tricuspid valve is normal in structure. Tricuspid valve regurgitation is mild . No evidence of tricuspid stenosis. Aortic Valve: The aortic valve is tricuspid. Aortic valve regurgitation is not visualized. No aortic stenosis is present. Pulmonic Valve: The pulmonic valve was not well visualized. Pulmonic valve regurgitation is not visualized. No evidence of pulmonic stenosis. Aorta: Aortic dilatation noted. There is mild dilatation of the aortic root measuring 39 mm. Venous: The inferior vena cava was not well visualized.  LEFT VENTRICLE PLAX 2D LVIDd:         3.70 cm  Diastology LVIDs:         2.60 cm  LV e' lateral:   7.40 cm/s LV PW:         0.70 cm  LV E/e' lateral: 9.4 LV IVS:        0.80 cm  LV e' medial:    5.98 cm/s LVOT diam:     2.00 cm  LV E/e' medial:  11.6 LV SV:         65 LV SV Index:   38 LVOT Area:     3.14 cm  RIGHT VENTRICLE RV Basal diam:  2.00 cm RV S prime:     12.00 cm/s TAPSE (M-mode): 1.0 cm LEFT ATRIUM             Index       RIGHT ATRIUM          Index LA diam:        2.50 cm 1.46 cm/m  RA Area:     5.60 cm LA Vol (A2C):   6.9  ml  4.02 ml/m  RA Volume:   8.63 ml  5.03 ml/m LA Vol (A4C):   19.3 ml  11.25 ml/m LA Biplane Vol: 12.2 ml 7.11 ml/m  AORTIC VALVE LVOT Vmax:   106.00 cm/s LVOT Vmean:  67.200 cm/s LVOT VTI:    0.207 m  AORTA Ao Root diam: 3.80 cm Ao Asc diam:  3.90 cm MITRAL VALVE               TRICUSPID VALVE MV Area (PHT): 2.86 cm    TR Peak grad:   23.2 mmHg MV Decel Time: 265 msec    TR Vmax:        241.00 cm/s MV E velocity: 69.40 cm/s MV A velocity: 79.70 cm/s  SHUNTS MV E/A ratio:  0.87        Systemic VTI:  0.21 m                            Systemic Diam: 2.00 cm Olga Millers MD Electronically signed by Olga Millers MD Signature Date/Time: 05/16/2020/1:34:44 PM    Final     Time Spent in minutes  30   Susa Raring M.D on 05/17/2020 at 11:38 AM  To page go to [email or web address] - password [facility]

## 2020-05-18 LAB — CULTURE, BLOOD (ROUTINE X 2)
Culture: NO GROWTH
Special Requests: ADEQUATE
Special Requests: ADEQUATE

## 2020-05-18 MED ORDER — MORPHINE SULFATE 20 MG/5ML PO SOLN: 2.5000 mg | ORAL | 0 refills | Status: DC | PRN

## 2020-05-18 MED ORDER — DOXYCYCLINE HYCLATE 100 MG PO TABS
100.0000 mg | ORAL_TABLET | Freq: Two times a day (BID) | ORAL | 0 refills | Status: DC
Start: 2020-05-18 — End: 2020-06-14

## 2020-05-18 MED ORDER — PANTOPRAZOLE SODIUM 40 MG PO TBEC: 40.0000 mg | DELAYED_RELEASE_TABLET | Freq: Every day | ORAL | 0 refills | Status: DC

## 2020-05-18 MED ORDER — CEPHALEXIN 500 MG PO CAPS: 500.0000 mg | ORAL_CAPSULE | Freq: Three times a day (TID) | ORAL | 0 refills | Status: DC

## 2020-05-18 NOTE — Discharge Summary (Signed)
Tracy Armstrong, is a 74 y.o. female  DOB Aug 26, 1946  MRN [id number].  Admission date:  05/13/2020  Admitting Physician  No admitting provider for patient encounter.  Discharge Date:  05/18/2020   Primary MD  Neale Burly, MD  Recommendations for primary care physician for things to follow:  -Management per [facility], patient has been discharged on oral antibiotics, and the course to be extended if she can tolerate oral intake.   Admission Diagnosis  Sepsis ([place]) [A41.9] Sepsis without acute organ dysfunction, due to unspecified organism [facility]) [A41.9]   Discharge Diagnosis  Sepsis ([place]) [A41.9] Sepsis without acute organ dysfunction, due to unspecified organism [facility]) [A41.9]    Principal Problem:   Sepsis ([place]) Active Problems:   Multiple sclerosis ([place])   Unstageable pressure injury of skin and tissue ([place])   Dementia without behavioral disturbance (HCC)   HTN (hypertension)   Lactic acidosis   Palliative care by specialist   Goals of care, counseling/discussion   DNR (do not resuscitate) discussion   Subacute osteomyelitis, left ankle and foot ([place])      Past Medical History:  Diagnosis Date  . Multiple sclerosis ([place]) 2009    Past Surgical History:  Procedure Laterality Date  . CHOLECYSTECTOMY         History of present illness and  Hospital Course:     Kindly see H&P for history of present illness and admission details, please review complete Labs, Consult reports and Test reports for all details in brief  HPI  from the history and physical done on the day of admission 05/13/2020   HPI: Tracy Armstrong is a 74 y.o. female with medical history significant of multiple sclerosis, dementia, hypertension and bedbound; who presented to emergency department secondary to worsening left foot infection, decrease appetite and worsening confusion. Patient's daughter reports over  the last month mom has been actively declining, eating less and with worsening drainage and appearance in her chronic left foot wound. No fever, no chills, no nausea, no vomiting, no CP, no diarrhea, no hematuria, no dysuria, no melena or any other complaints.   *Patient met sepsis criteria on presentation with tachycardia, leukocytosis, lactic acidosis, worsening mentation (despite underlying hx of dementia) and source of infection left foot osteomyelitis.   ED Course: Blood cultures has been checked, patient with positive sepsis criteria in the setting of left foot osteomyelitis.  Broad-spectrum antibiotic has been started; orthopedic surgery consulted and recommendation to transfer to [facility] after MRI for further evaluation and management.  Patient may end up requiring to have amputation (left BKA).  TRH has been consulted to facilitate patient's admission, transferring and further care.  Hospital Course   Sepsis due to left foot cellulitis along with osteomyelitis chronic.  - On empiric IV antibiotics, has been hydrated and sepsis pathophysiology has resolved, and out of 2 blood cultures positive for coag negative staph at [facility], orthopedics saw the patient and initially thought about bilateral BKA however after detailed discussion with palliative care, family  it was decided that patient will be best served by being discharged with home hospice on oral antibiotics and goal of treatment being directed towards comfort.  She will be discharged on Keflex and doxycycline.  Chronic deconditioning, cachexia, multiple sclerosis. She is bedbound, severe contractures in all 4 extremities especially lower extremities, palliative medicine input greatly appreciated, plan to discharge home with home hospice.   History of dementia. Continue Aricept and supportive care.   Hypertension.  Ziac and Lasix has been stopped on discharge  Severe PCM and deconditioning. Nutritionist  consult,  Hypokalemia.  Replaced.    Severe chronic microcytic anemia worse with heme dilution due to IV fluids.  No signs of ongoing active bleeding,  2 units of packed RBC on 05/15/2020.  Not a candidate for invasive testing or procedures.   Multiple unstageable decubitus ulcers and pressure wounds present on admission. - Wound care consulted.  Pressure Injury 05/13/20 Buttocks Medial;Bilateral Unstageable - Full thickness tissue loss in which the base of the injury is covered by slough (yellow, tan, gray, green or brown) and/or eschar (tan, brown or black) in the wound bed. (Active)  05/13/20 1030  Location: Buttocks  Location Orientation: Medial;Bilateral  Staging: Unstageable - Full thickness tissue loss in which the base of the injury is covered by slough (yellow, tan, gray, green or brown) and/or eschar (tan, brown or black) in the wound bed.  Wound Description (Comments):   Present on Admission: Yes     Pressure Injury 05/13/20 Ankle Right Unstageable - Full thickness tissue loss in which the base of the injury is covered by slough (yellow, tan, gray, green or brown) and/or eschar (tan, brown or black) in the wound bed. (Active)  05/13/20 1030  Location: Ankle  Location Orientation: Right  Staging: Unstageable - Full thickness tissue loss in which the base of the injury is covered by slough (yellow, tan, gray, green or brown) and/or eschar (tan, brown or black) in the wound bed.  Wound Description (Comments):   Present on Admission: Yes     Pressure Injury 05/13/20 Ankle Left Unstageable - Full thickness tissue loss in which the base of the injury is covered by slough (yellow, tan, gray, green or brown) and/or eschar (tan, brown or black) in the wound bed. (Active)  05/13/20 1030  Location: Ankle  Location Orientation: Left  Staging: Unstageable - Full thickness tissue loss in which the base of the injury is covered by slough (yellow, tan, gray, green or brown) and/or eschar  (tan, brown or black) in the wound bed.  Wound Description (Comments):   Present on Admission: Yes     Pressure Injury 05/13/20 Knee Right Unstageable - Full thickness tissue loss in which the base of the injury is covered by slough (yellow, tan, gray, green or brown) and/or eschar (tan, brown or black) in the wound bed. (Active)  05/13/20 1030  Location: Knee  Location Orientation: Right  Staging: Unstageable - Full thickness tissue loss in which the base of the injury is covered by slough (yellow, tan, gray, green or brown) and/or eschar (tan, brown or black) in the wound bed.  Wound Description (Comments):   Present on Admission:         Discharge Condition:  - Hospice      Discharge Instructions  and  Discharge Medications    Discharge Instructions    Diet - low sodium heart healthy   Complete by: As directed    Discharge instructions   Complete by: As directed  Management per [facility], patient on dysphagia 3 diet with thin liquids.   Discharge wound care:   Complete by: As directed    Wound care to left LE wounds:  cleanse with NS, pat dry. Cover with saline moistened gauze 4x4s, top with dry gauze/ABD and secure with Kerlix/paper tape.  Change daily and PRN drainage strike-through onto exterior of dressing.    Wound care to RLE full thickness wound:  Cleanse with NS, pat dry Cover with size appropriate piece of silver hydrofiber (Aquacel Advantage, [employer] # [id number]) , top with dry gauze and secure with a few turns of Kerlix roll gauze/paper tape. Change daily.   Increase activity slowly   Complete by: As directed      Allergies as of 05/18/2020   No Known Allergies     Medication List    STOP taking these medications   bisoprolol-hydrochlorothiazide 5-6.25 MG tablet Commonly known as: ZIAC   furosemide 20 MG tablet Commonly known as: LASIX     TAKE these medications   cephALEXin 500 MG capsule Commonly known as: KEFLEX Take 1 capsule (500 mg  total) by mouth 3 (three) times daily for 14 days.   donepezil 10 MG tablet Commonly known as: ARICEPT Take 10 mg by mouth at bedtime.   doxycycline 100 MG tablet Commonly known as: VIBRA-TABS Take 1 tablet (100 mg total) by mouth 2 (two) times daily for 14 days.   mirtazapine 7.5 MG tablet Commonly known as: REMERON Take 7.5 mg by mouth at bedtime.   morphine 20 MG/5ML solution Take 0.6 mLs (2.4 mg total) by mouth every 4 (four) hours as needed for pain (or dyspnea). Hospice patient   pantoprazole 40 MG tablet Commonly known as: PROTONIX Take 1 tablet (40 mg total) by mouth daily. Start taking on: May 19, 2020            Discharge Care Instructions  (From admission, onward)         Start     Ordered   05/18/20 0000  Discharge wound care:       Comments: Wound care to left LE wounds:  cleanse with NS, pat dry. Cover with saline moistened gauze 4x4s, top with dry gauze/ABD and secure with Kerlix/paper tape.  Change daily and PRN drainage strike-through onto exterior of dressing.    Wound care to RLE full thickness wound:  Cleanse with NS, pat dry Cover with size appropriate piece of silver hydrofiber (Aquacel Advantage, [employer] # [id number]) , top with dry gauze and secure with a few turns of Kerlix roll gauze/paper tape. Change daily.   05/18/20 1205            Diet and Activity recommendation: See Discharge Instructions above   Consults obtained -  Palliative  Ortho  Major procedures and Radiology Reports - PLEASE review detailed and final reports for all details, in brief -     DG Ankle 2 Views Right  Result Date: 05/13/2020 CLINICAL DATA:  Pressure wound. EXAM: RIGHT ANKLE - 2 VIEW COMPARISON:  No recent prior. FINDINGS: Large soft tissue wound noted over the medial aspect of the right ankle. No radiopaque foreign body. No underlying bony erosion. No evidence of fracture or dislocation. Diffuse osteopenia and degenerative change. IMPRESSION: 1. Large soft tissue  wound noted over the medial aspect of the right ankle. No radiopaque foreign body. No underlying bony erosion. 2. No acute bony abnormality. Diffuse osteopenia and degenerative change. Electronically Signed   By: Marcello Moores  Register  On: 05/13/2020 12:33   MR FOOT LEFT W WO CONTRAST  Result Date: 05/13/2020 CLINICAL DATA:  Nonhealing pressure all ulcers of the left foot and ankle EXAM: MRI OF THE LEFT FOREFOOT WITHOUT AND WITH CONTRAST; MRI OF THE LEFT ANKLE WITHOUT AND WITH CONTRAST TECHNIQUE: Multiplanar, multisequence MR imaging of the left foot and ankle was performed both before and after administration of intravenous contrast. CONTRAST:  47m GADAVIST GADOBUTROL 1 MMOL/ML IV SOLN COMPARISON:  X-ray 05/13/2020 FINDINGS: Bones: There is a deep soft tissue ulceration along the medial aspect of the ankle with ulcer base closely approximating the medial cortex of the medial malleolus and talus. Associated bone marrow edema and enhancement within the peripheral aspect of the medial malleolus and medial talus with loss of cortical definition and low T1 bone marrow signal compatible with acute osteomyelitis (series 5 and 6, image 10). Susceptibility within the tibiotalar joint space suggesting air tracking intra-articularly. Trace tibiotalar joint effusion with enhancing synovium concerning for septic arthritis. Trace talonavicular joint effusion with marrow edema and cortical irregularity along both sides of the joint (series 8 and 9, image 12). Findings are suspicious for septic arthritis and osteomyelitis. Similar findings are present at the calcaneocuboid joint with small joint effusion and associated subchondral marrow edema (series 9, image 17). Marrow edema within the residual great toe distal phalanx (series 9, image 10) raises suspicion for osteomyelitis. There is evidence of previous resection of the distal tuft. Additional ulceration over the posterior calcaneus with marrow edema and low T1 signal along the  peripheral margin of the posterior calcaneus suspicious for osteomyelitis (series 1 and 2, image 11). Tendons: The posterior tibialis and flexor digitorum longus tendons extend into the area of ulceration at the lateral ankle and are not well delineated, and may be torn. Flexor hallucis longus tendon intact. Tendinosis of the distal Achilles tendon with poor delineation of the distal insertion at site of likely pressure ulceration. Peroneal tendons appear intact. Ligaments: LisFranc ligament intact. Deltoid and spring ligament complex are poorly evaluated at site of ulceration, likely torn. Lateral ankle tendons grossly intact. Soft tissues: Deep soft tissue ulceration at the lateral ankle, as described above. Extensive surrounding soft tissue edema and enhancement compatible with cellulitis. No drainable fluid collection. Extensive pressure ulceration at the posterior hindfoot. There are extensive foci of susceptibility tracking within the soft tissues of the lateral forefoot (series 8, images 22-23). Additional areas of more superficial ulceration along the plantar aspect of the midfoot and forefoot with surrounding cellulitis. IMPRESSION: 1. Deep soft tissue ulceration along the medial aspect of the ankle with ulcer base extending to the medial cortex of the medial malleolus and talus. Associated signal changes within the peripheral aspects of the medial malleolus and medial talus compatible with acute osteomyelitis. 2. Trace tibiotalar joint effusion with enhancing synovium concerning for septic arthritis. There appears to be air or gas within the tibiotalar joint, possibly extending from adjacent ulceration. 3. Trace talonavicular joint effusion with marrow edema and cortical irregularity along both sides of the joint suspicious for septic arthritis and osteomyelitis. 4. Similar findings at the calcaneocuboid joint also suspicious for septic arthritis and osteomyelitis. 5. Additional ulceration over the at the  posterior hindfoot with acute osteomyelitis of the posterior calcaneus. 6. Marrow edema within the residual great toe distal phalanx which may reflect osteitis versus early osteomyelitis. 7. Extensive cellulitis with numerous foci of susceptibility tracking within the soft tissues of the lateral forefoot concerning for gas-forming infection. No drainable fluid collection. 8. Tendinous  and ligamentous abnormalities, as above. These results will be called to the ordering clinician or representative by the Radiologist Assistant, and communication documented in the PACS or [employer]. Electronically Signed   By: Davina Poke D.O.   On: 05/13/2020 15:56   MR ANKLE LEFT W WO CONTRAST  Result Date: 05/13/2020 CLINICAL DATA:  Nonhealing pressure all ulcers of the left foot and ankle EXAM: MRI OF THE LEFT FOREFOOT WITHOUT AND WITH CONTRAST; MRI OF THE LEFT ANKLE WITHOUT AND WITH CONTRAST TECHNIQUE: Multiplanar, multisequence MR imaging of the left foot and ankle was performed both before and after administration of intravenous contrast. CONTRAST:  79m GADAVIST GADOBUTROL 1 MMOL/ML IV SOLN COMPARISON:  X-ray 05/13/2020 FINDINGS: Bones: There is a deep soft tissue ulceration along the medial aspect of the ankle with ulcer base closely approximating the medial cortex of the medial malleolus and talus. Associated bone marrow edema and enhancement within the peripheral aspect of the medial malleolus and medial talus with loss of cortical definition and low T1 bone marrow signal compatible with acute osteomyelitis (series 5 and 6, image 10). Susceptibility within the tibiotalar joint space suggesting air tracking intra-articularly. Trace tibiotalar joint effusion with enhancing synovium concerning for septic arthritis. Trace talonavicular joint effusion with marrow edema and cortical irregularity along both sides of the joint (series 8 and 9, image 12). Findings are suspicious for septic arthritis and osteomyelitis.  Similar findings are present at the calcaneocuboid joint with small joint effusion and associated subchondral marrow edema (series 9, image 17). Marrow edema within the residual great toe distal phalanx (series 9, image 10) raises suspicion for osteomyelitis. There is evidence of previous resection of the distal tuft. Additional ulceration over the posterior calcaneus with marrow edema and low T1 signal along the peripheral margin of the posterior calcaneus suspicious for osteomyelitis (series 1 and 2, image 11). Tendons: The posterior tibialis and flexor digitorum longus tendons extend into the area of ulceration at the lateral ankle and are not well delineated, and may be torn. Flexor hallucis longus tendon intact. Tendinosis of the distal Achilles tendon with poor delineation of the distal insertion at site of likely pressure ulceration. Peroneal tendons appear intact. Ligaments: LisFranc ligament intact. Deltoid and spring ligament complex are poorly evaluated at site of ulceration, likely torn. Lateral ankle tendons grossly intact. Soft tissues: Deep soft tissue ulceration at the lateral ankle, as described above. Extensive surrounding soft tissue edema and enhancement compatible with cellulitis. No drainable fluid collection. Extensive pressure ulceration at the posterior hindfoot. There are extensive foci of susceptibility tracking within the soft tissues of the lateral forefoot (series 8, images 22-23). Additional areas of more superficial ulceration along the plantar aspect of the midfoot and forefoot with surrounding cellulitis. IMPRESSION: 1. Deep soft tissue ulceration along the medial aspect of the ankle with ulcer base extending to the medial cortex of the medial malleolus and talus. Associated signal changes within the peripheral aspects of the medial malleolus and medial talus compatible with acute osteomyelitis. 2. Trace tibiotalar joint effusion with enhancing synovium concerning for septic  arthritis. There appears to be air or gas within the tibiotalar joint, possibly extending from adjacent ulceration. 3. Trace talonavicular joint effusion with marrow edema and cortical irregularity along both sides of the joint suspicious for septic arthritis and osteomyelitis. 4. Similar findings at the calcaneocuboid joint also suspicious for septic arthritis and osteomyelitis. 5. Additional ulceration over the at the posterior hindfoot with acute osteomyelitis of the posterior calcaneus. 6. Marrow edema within  the residual great toe distal phalanx which may reflect osteitis versus early osteomyelitis. 7. Extensive cellulitis with numerous foci of susceptibility tracking within the soft tissues of the lateral forefoot concerning for gas-forming infection. No drainable fluid collection. 8. Tendinous and ligamentous abnormalities, as above. These results will be called to the ordering clinician or representative by the Radiologist Assistant, and communication documented in the PACS or [employer]. Electronically Signed   By: Davina Poke D.O.   On: 05/13/2020 15:56   DG Chest Port 1 View  Result Date: 05/13/2020 CLINICAL DATA:  Sepsis EXAM: PORTABLE CHEST 1 VIEW COMPARISON:  Portable exam 1041 hours without priors for comparison FINDINGS: Normal heart size, mediastinal contours, and pulmonary vascularity. Lungs clear. No pulmonary infiltrate, pleural effusion or pneumothorax. Bones demineralized. IMPRESSION: No acute abnormalities. Electronically Signed   By: Lavonia Dana M.D.   On: 05/13/2020 11:05   DG Foot Complete Left  Result Date: 05/13/2020 CLINICAL DATA:  Multiple nonhealing pressure ulcers, sepsis EXAM: LEFT FOOT - COMPLETE 4 VIEW COMPARISON:  None FINDINGS: Nonstandard positioning due to patient condition. Marked osseous demineralization. Diffuse soft tissue swelling LEFT foot with foci of soft tissue gas at the plantar aspect of the foot at the level of the fourth and fifth metatarsals. This  could be due to open wound or infection by a gas-forming organism. 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 Advanced degenerative changes at the talonavicular joint with superior navicular subluxation, could represent inflammatory arthropathy or septic arthritis. Probable ankle joint effusion. Additional degenerative changes at the naviculocuneiform joint and at the calcaneocuboid joint. No acute fracture, dislocation, or additional bone destruction. Lucency projecting over ankle joint on lateral view likely represents an additional ulcer though this is less well localized due to nonstandard positioning. IMPRESSION: Soft tissue gas at plantar aspect of lateral LEFT foot at the level of the fourth and fifth metatarsals either due to open wound or infection by a gas-forming organism. Osseous demineralization with scattered degenerative changes as well as destructive changes and subluxation at the talonavicular joint, could represent inflammatory arthropathy but is concerning for septic arthritis. Suspected ankle joint effusion. Findings called to Nuala Alpha PA on 05/13/2020 at 1110 hours. Electronically Signed   By: Lavonia Dana M.D.   On: 05/13/2020 11:12   ECHOCARDIOGRAM COMPLETE  Result Date: 05/16/2020    ECHOCARDIOGRAM REPORT   Patient Name:   DELORISE HUNKELE Date of Exam: 05/16/2020 Medical Rec #:  [id number]      Height:       66.0 in Accession #:    [id number]     Weight:       139.3 lb Date of Birth:  1946-10-04      BSA:          1.715 m Patient Age:    47 years       BP:           89/55 mmHg Patient Gender: F              HR:           97 bpm. Exam Location:  Inpatient Procedure: 2D Echo, Cardiac Doppler and Color Doppler Indications:    I50.33 Acute on chronic diastolic (congestive) heart failure  History:        Patient has no prior history of Echocardiogram examinations.                 Multiple Sclerosis.  Sonographer:    Photographer  Referring Phys: [id number] Margaree Mackintosh  [facility]  Sonographer Comments: Suboptimal subcostal window and Technically difficult study due to poor echo windows. IMPRESSIONS  1. Left ventricular ejection fraction, by estimation, is 60 to 65%. The left ventricle has normal function. The left ventricle has no regional wall motion abnormalities. Left ventricular diastolic parameters are consistent with Grade I diastolic dysfunction (impaired relaxation).  2. Right ventricular systolic function is normal. The right ventricular size is normal.  3. The mitral valve is normal in structure. Trivial mitral valve regurgitation. No evidence of mitral stenosis.  4. The aortic valve is tricuspid. Aortic valve regurgitation is not visualized. No aortic stenosis is present.  5. Aortic dilatation noted. There is mild dilatation of the aortic root measuring 39 mm. FINDINGS  Left Ventricle: Left ventricular ejection fraction, by estimation, is 60 to 65%. The left ventricle has normal function. The left ventricle has no regional wall motion abnormalities. The left ventricular internal cavity size was normal in size. There is  no left ventricular hypertrophy. Left ventricular diastolic parameters are consistent with Grade I diastolic dysfunction (impaired relaxation). Right Ventricle: The right ventricular size is normal. Right ventricular systolic function is normal. Left Atrium: Left atrial size was normal in size. Right Atrium: Right atrial size was normal in size. Pericardium: There is no evidence of pericardial effusion. Mitral Valve: The mitral valve is normal in structure. Normal mobility of the mitral valve leaflets. Trivial mitral valve regurgitation. No evidence of mitral valve stenosis. Tricuspid Valve: The tricuspid valve is normal in structure. Tricuspid valve regurgitation is mild . No evidence of tricuspid stenosis. Aortic Valve: The aortic valve is tricuspid. Aortic valve regurgitation is not visualized. No aortic stenosis is present. Pulmonic Valve: The pulmonic  valve was not well visualized. Pulmonic valve regurgitation is not visualized. No evidence of pulmonic stenosis. Aorta: Aortic dilatation noted. There is mild dilatation of the aortic root measuring 39 mm. Venous: The inferior vena cava was not well visualized.  LEFT VENTRICLE PLAX 2D LVIDd:         3.70 cm  Diastology LVIDs:         2.60 cm  LV e' lateral:   7.40 cm/s LV PW:         0.70 cm  LV E/e' lateral: 9.4 LV IVS:        0.80 cm  LV e' medial:    5.98 cm/s LVOT diam:     2.00 cm  LV E/e' medial:  11.6 LV SV:         65 LV SV Index:   38 LVOT Area:     3.14 cm  RIGHT VENTRICLE RV Basal diam:  2.00 cm RV S prime:     12.00 cm/s TAPSE (M-mode): 1.0 cm LEFT ATRIUM             Index       RIGHT ATRIUM          Index LA diam:        2.50 cm 1.46 cm/m  RA Area:     5.60 cm LA Vol (A2C):   6.9 ml  4.02 ml/m  RA Volume:   8.63 ml  5.03 ml/m LA Vol (A4C):   19.3 ml 11.25 ml/m LA Biplane Vol: 12.2 ml 7.11 ml/m  AORTIC VALVE LVOT Vmax:   106.00 cm/s LVOT Vmean:  67.200 cm/s LVOT VTI:    0.207 m  AORTA Ao Root diam: 3.80 cm Ao Asc diam:  3.90 cm MITRAL VALVE  TRICUSPID VALVE MV Area (PHT): 2.86 cm    TR Peak grad:   23.2 mmHg MV Decel Time: 265 msec    TR Vmax:        241.00 cm/s MV E velocity: 69.40 cm/s MV A velocity: 79.70 cm/s  SHUNTS MV E/A ratio:  0.87        Systemic VTI:  0.21 m                            Systemic Diam: 2.00 cm Kirk Ruths MD Electronically signed by Kirk Ruths MD Signature Date/Time: 05/16/2020/1:34:44 PM    Final     Micro Results    Recent Results (from the past 240 hour(s))  SARS Coronavirus 2 by RT PCR (hospital order, performed in [facility] hospital lab) Nasopharyngeal Nasopharyngeal Swab     Status: None   Collection Time: 05/13/20 10:59 AM   Specimen: Nasopharyngeal Swab  Result Value Ref Range Status   SARS Coronavirus 2 NEGATIVE NEGATIVE Final    Comment: (NOTE) SARS-CoV-2 target nucleic acids are NOT DETECTED.  The SARS-CoV-2 RNA is generally  detectable in upper and lower respiratory specimens during the acute phase of infection. The lowest concentration of SARS-CoV-2 viral copies this assay can detect is 250 copies / mL. A negative result does not preclude SARS-CoV-2 infection and should not be used as the sole basis for treatment or other patient management decisions.  A negative result may occur with improper specimen collection / handling, submission of specimen other than nasopharyngeal swab, presence of viral mutation(s) within the areas targeted by this assay, and inadequate number of viral copies (<250 copies / mL). A negative result must be combined with clinical observations, patient history, and epidemiological information.  Fact Sheet for Patients:   StrictlyIdeas.no  Fact Sheet for Healthcare Providers: BankingDealers.co.za  This test is not yet approved or  cleared by the [place] FDA and has been authorized for detection and/or diagnosis of SARS-CoV-2 by FDA under an Emergency Use Authorization (EUA).  This EUA will remain in effect (meaning this test can be used) for the duration of the COVID-19 declaration under Section 564(b)(1) of the Act, 21 U.S.C. section 360bbb-3(b)(1), unless the authorization is terminated or revoked sooner.  Performed at [facility], [address], [place], [place] [address]   Blood Culture (routine x 2)     Status: Abnormal   Collection Time: 05/13/20 11:45 AM   Specimen: BLOOD RIGHT HAND  Result Value Ref Range Status   Specimen Description   Final    BLOOD RIGHT HAND Performed at [facility], [place] [address], [place], [place] [address]    Special Requests   Final    BOTTLES DRAWN AEROBIC ONLY Blood Culture adequate volume Performed at [facility], [address] [place], [place] [address]    Culture  Setup Time   Final    GRAM POSITIVE COCCI AEROBIC BOTTLE ONLY Gram Stain Report Called to,Read Back By and  Verified With: MICHELLE MOORE,RN '@[id number]'$  05/15/2020 KAY CRITICAL RESULT CALLED TO, READ BACK BY AND VERIFIED WITH: PHARMD K PIERCE [id number]  AT 1335 BY CM    Culture (A)  Final    STAPHYLOCOCCUS SPECIES (COAGULASE NEGATIVE) THE SIGNIFICANCE OF ISOLATING THIS ORGANISM FROM A SINGLE SET OF BLOOD CULTURES WHEN MULTIPLE SETS ARE DRAWN IS UNCERTAIN. PLEASE NOTIFY THE MICROBIOLOGY DEPARTMENT WITHIN ONE WEEK IF SPECIATION AND SENSITIVITIES ARE REQUIRED. Performed at [facility], [address] [place], [place] [address]    Report Status 05/16/2020 FINAL  Final  Blood Culture (routine x 2)     Status: None   Collection Time: 05/13/20 11:45 AM   Specimen: BLOOD  Result Value Ref Range Status   Specimen Description BLOOD RIGHT ANTECUBITAL  Final   Special Requests   Final    BOTTLES DRAWN AEROBIC AND ANAEROBIC Blood Culture adequate volume   Culture   Final    NO GROWTH 5 DAYS Performed at [facility], [address], [place], [place] [address]    Report Status 05/18/2020 FINAL  Final  Blood Culture ID Panel (Reflexed)     Status: Abnormal   Collection Time: 05/13/20 11:45 AM  Result Value Ref Range Status   Enterococcus species NOT DETECTED NOT DETECTED Final   Listeria monocytogenes NOT DETECTED NOT DETECTED Final   Staphylococcus species DETECTED (A) NOT DETECTED Final    Comment: Methicillin (oxacillin) susceptible coagulase negative staphylococcus. Possible blood culture contaminant (unless isolated from more than one blood culture draw or clinical case suggests pathogenicity). No antibiotic treatment is indicated for blood  culture contaminants. CRITICAL RESULT CALLED TO, READ BACK BY AND VERIFIED WITH: PHARMD K PIERCE [id number] AT 1335 BY CM    Staphylococcus aureus (BCID) NOT DETECTED NOT DETECTED Final   Methicillin resistance NOT DETECTED NOT DETECTED Final   Streptococcus species NOT DETECTED NOT DETECTED Final   Streptococcus agalactiae NOT DETECTED NOT DETECTED Final    Streptococcus pneumoniae NOT DETECTED NOT DETECTED Final   Streptococcus pyogenes NOT DETECTED NOT DETECTED Final   Acinetobacter baumannii NOT DETECTED NOT DETECTED Final   Enterobacteriaceae species NOT DETECTED NOT DETECTED Final   Enterobacter cloacae complex NOT DETECTED NOT DETECTED Final   Escherichia coli NOT DETECTED NOT DETECTED Final   Klebsiella oxytoca NOT DETECTED NOT DETECTED Final   Klebsiella pneumoniae NOT DETECTED NOT DETECTED Final   Proteus species NOT DETECTED NOT DETECTED Final   Serratia marcescens NOT DETECTED NOT DETECTED Final   Haemophilus influenzae NOT DETECTED NOT DETECTED Final   Neisseria meningitidis NOT DETECTED NOT DETECTED Final   Pseudomonas aeruginosa NOT DETECTED NOT DETECTED Final   Candida albicans NOT DETECTED NOT DETECTED Final   Candida glabrata NOT DETECTED NOT DETECTED Final   Candida krusei NOT DETECTED NOT DETECTED Final   Candida parapsilosis NOT DETECTED NOT DETECTED Final   Candida tropicalis NOT DETECTED NOT DETECTED Final    Comment: Performed at [facility], [place]. [address] [place], [place] [address]  MRSA PCR Screening     Status: None   Collection Time: 05/15/20 10:17 AM   Specimen: Nasal Mucosa; Nasopharyngeal  Result Value Ref Range Status   MRSA by PCR NEGATIVE NEGATIVE Final    Comment:        The GeneXpert MRSA Assay (FDA approved for NASAL specimens only), is one component of a comprehensive MRSA colonization surveillance program. It is not intended to diagnose MRSA infection nor to guide or monitor treatment for MRSA infections. Performed at [facility], [place] [address], [place], [place] [address]        Today   Subjective:   Arletha Marschke today with no significant events overnight as discussed with staff .  Objective:   Blood pressure 120/82, pulse 75, temperature 97.7 F (36.5 C), temperature source Oral, resp. rate 13, height '5\' 6"'$  (1.676 m), weight 63.2 kg, SpO2 100 %.   Intake/Output  Summary (Last 24 hours) at 05/18/2020 1206 Last data  filed at 05/18/2020 0700 Gross per 24 hour  Intake 755.59 ml  Output 150 ml  Net 605.59 ml    Exam  Awake Alert, thin appearing, chronically ill-appearing, cachectic, advanced MS, some intermittent confusions, Symmetrical Chest wall movement, Good air movement bilaterally, CTAB RRR,No Gallops,Rubs or new Murmurs, No Parasternal Heave +ve B.Sounds, Abd Soft, Non tender, No organomegaly appriciated, No rebound -guarding or rigidity. Patient with severe chronic lower extremity contractures, with some milder contracture in upper extremities.  Both feet with dressing.  Data Review   CBC w Diff:  Lab Results  Component Value Date   WBC 12.4 (H) 05/17/2020   HGB 10.7 (L) 05/17/2020   HCT 33.8 (L) 05/17/2020   PLT 185 05/17/2020   LYMPHOPCT 16 05/17/2020   MONOPCT 4 05/17/2020   EOSPCT 0 05/17/2020   BASOPCT 0 05/17/2020    CMP:  Lab Results  Component Value Date   NA 139 05/17/2020   K 4.1 05/17/2020   CL 106 05/17/2020   CO2 22 05/17/2020   BUN 14 05/17/2020   CREATININE 0.47 05/17/2020   PROT 5.8 (L) 05/17/2020   ALBUMIN 1.3 (L) 05/17/2020   BILITOT 0.8 05/17/2020   ALKPHOS 95 05/17/2020   AST 15 05/17/2020   ALT 12 05/17/2020  .   Total Time in preparing paper work, data evaluation and todays exam - 31 minutes  Phillips Climes M.D on 05/18/2020 at 12:06 PM  [facility]   Office  [phone]

## 2020-05-18 NOTE — TOC Initial Note (Signed)
Transition of Care [facility]) - Initial/Assessment Note    Patient Details  Name: Tracy Armstrong MRN: [id number] Date of Birth: 1945/12/21  Transition of [facility]) CM/SW Contact:    Kingsley Plan, RN Phone Number: 05/18/2020, 11:43 AM  Clinical Narrative:                 Sherron Monday to Cassandra with [facility]. Referral for home hospice was accepted and DME ordered. They are aware discharge is planned for today. Requesting prescriptions to be sent to [facility].   Spoke to daughter Tracy Armstrong. Dme was delivered , [facility] ready for her mother to be discharged today. Requesting PTAR for 4 pm today and requesting nurse to call when PTAR arrives at hospital. Secure chat sent to MD and nurse.   Expected Discharge Plan: Home w Hospice Care Barriers to Discharge: No Barriers Identified   Patient Goals and CMS Choice     Choice offered to / list presented to : Adult Children  Expected Discharge Plan and Services Expected Discharge Plan: Home w Hospice Care   Discharge Planning Services: CM Consult Post Acute Care Choice: Hospice Living arrangements for the past 2 months: Single Family Home                             HH Agency: [facility] Date [employer] Agency Contacted: 05/18/20 Time HH Agency Contacted: 1143 Representative spoke with at [facility] Agency: Cassandra  Prior Living Arrangements/Services Living arrangements for the past 2 months: Single Family Home Lives with:: Adult Children                   Activities of Daily Living Home Assistive Devices/Equipment: Hospital bed ADL Screening (condition at time of admission) Patient's cognitive ability adequate to safely complete daily activities?: No Is the patient deaf or have difficulty hearing?: No Does the patient have difficulty seeing, even when wearing glasses/contacts?: No Does the patient have difficulty concentrating, remembering, or making decisions?: Yes Patient able to  express need for assistance with ADLs?: Yes Does the patient have difficulty dressing or bathing?: Yes Independently performs ADLs?: No Communication: Independent Dressing (OT): Dependent Is this a change from baseline?: Pre-admission baseline Grooming: Dependent Is this a change from baseline?: Pre-admission baseline Feeding: Dependent Is this a change from baseline?: Pre-admission baseline Bathing: Dependent Is this a change from baseline?: Pre-admission baseline Toileting: Dependent Is this a change from baseline?: Pre-admission baseline In/Out Bed: Dependent Is this a change from baseline?: Pre-admission baseline Walks in Home: Dependent Is this a change from baseline?: Pre-admission baseline Does the patient have difficulty walking or climbing stairs?: Yes Weakness of Legs: Both Weakness of Arms/Hands: Both  Permission Sought/Granted                  Emotional Assessment              Admission diagnosis:  Sepsis (HCC) [A41.9] Sepsis without acute organ dysfunction, due to unspecified organism [facility]) [A41.9] Patient Active Problem List   Diagnosis Date Noted  . Subacute osteomyelitis, left ankle and foot (HCC)   . Palliative care by specialist   . Goals of care, counseling/discussion   . DNR (do not resuscitate) discussion   . Sepsis (HCC) 05/13/2020  . Multiple sclerosis (HCC) 05/13/2020  . Unstageable pressure injury of skin and tissue (HCC) 05/13/2020  . Dementia without behavioral disturbance (HCC) 05/13/2020  . HTN (hypertension) 05/13/2020  .  Lactic acidosis 05/13/2020   PCP:  [name], MD Pharmacy:   [facility] [address] [place] - [address] [place] [address] Phone: [phone] Fax: [phone]     Social Determinants of Health (SDOH) Interventions    Readmission Risk Interventions No flowsheet data found.

## 2020-05-18 NOTE — Progress Notes (Signed)
Pt. Discharged home via [place] , daugther  Frederich Chick called and notified, stated they do have a ramp at the home. PTAR updated. IV removed, vitals checked and belongings including denture upper sent, no clothes or other personal items at bedside.

## 2020-05-18 NOTE — Discharge Instructions (Signed)
Management by [facility] hospice.

## 2020-05-18 NOTE — Progress Notes (Deleted)
[facility] ([facility])  Hospital Liaison: RN note         Notified by TOC manager of patient/family request for [facility] Palliative services at home after discharge.               [facility] Palliative team will follow up with patient after discharge.         Please call with any hospice or palliative related questions.         Melissa O'Bryant, RN,BSN [facility] Hospital Liaison (listed on [facility] under Hospice/[facility])    [phone]   

## 2020-05-19 ENCOUNTER — Telehealth: Payer: Self-pay | Admitting: Internal Medicine

## 2020-05-19 DIAGNOSIS — I10 Essential (primary) hypertension: Secondary | ICD-10-CM | POA: Diagnosis not present

## 2020-05-19 DIAGNOSIS — R531 Weakness: Secondary | ICD-10-CM | POA: Diagnosis not present

## 2020-05-19 DIAGNOSIS — R64 Cachexia: Secondary | ICD-10-CM | POA: Diagnosis not present

## 2020-05-19 DIAGNOSIS — G35 Multiple sclerosis: Secondary | ICD-10-CM | POA: Diagnosis not present

## 2020-05-19 DIAGNOSIS — G309 Alzheimer's disease, unspecified: Secondary | ICD-10-CM | POA: Diagnosis not present

## 2020-05-19 DIAGNOSIS — R112 Nausea with vomiting, unspecified: Secondary | ICD-10-CM | POA: Diagnosis not present

## 2020-05-19 DIAGNOSIS — R52 Pain, unspecified: Secondary | ICD-10-CM | POA: Diagnosis not present

## 2020-05-19 DIAGNOSIS — K219 Gastro-esophageal reflux disease without esophagitis: Secondary | ICD-10-CM | POA: Diagnosis not present

## 2020-05-19 NOTE — Telephone Encounter (Signed)
Scheduled [facility] Palliative visit for 05-24-20 at 8:30.

## 2020-05-20 DIAGNOSIS — R112 Nausea with vomiting, unspecified: Secondary | ICD-10-CM | POA: Diagnosis not present

## 2020-05-20 DIAGNOSIS — R52 Pain, unspecified: Secondary | ICD-10-CM | POA: Diagnosis not present

## 2020-05-20 DIAGNOSIS — R531 Weakness: Secondary | ICD-10-CM | POA: Diagnosis not present

## 2020-05-20 DIAGNOSIS — G35 Multiple sclerosis: Secondary | ICD-10-CM | POA: Diagnosis not present

## 2020-05-20 DIAGNOSIS — R64 Cachexia: Secondary | ICD-10-CM | POA: Diagnosis not present

## 2020-05-20 DIAGNOSIS — I10 Essential (primary) hypertension: Secondary | ICD-10-CM | POA: Diagnosis not present

## 2020-05-20 SURGERY — AMPUTATION, ABOVE KNEE
Anesthesia: General | Site: Knee | Laterality: Bilateral

## 2020-05-24 ENCOUNTER — Other Ambulatory Visit: Payer: Self-pay | Admitting: Internal Medicine

## 2020-05-24 DIAGNOSIS — I10 Essential (primary) hypertension: Secondary | ICD-10-CM | POA: Diagnosis not present

## 2020-05-24 DIAGNOSIS — R531 Weakness: Secondary | ICD-10-CM | POA: Diagnosis not present

## 2020-05-24 DIAGNOSIS — R64 Cachexia: Secondary | ICD-10-CM | POA: Diagnosis not present

## 2020-05-24 DIAGNOSIS — R52 Pain, unspecified: Secondary | ICD-10-CM | POA: Diagnosis not present

## 2020-05-24 DIAGNOSIS — G35 Multiple sclerosis: Secondary | ICD-10-CM | POA: Diagnosis not present

## 2020-05-24 DIAGNOSIS — R112 Nausea with vomiting, unspecified: Secondary | ICD-10-CM | POA: Diagnosis not present

## 2020-05-31 ENCOUNTER — Inpatient Hospital Stay (HOSPITAL_COMMUNITY)
Admission: EM | Admit: 2020-05-31 | Discharge: 2020-06-14 | DRG: 853 | Disposition: A | Discharge status: Skilled Nursing Facility | Payer: Medicare Other | Attending: Family Medicine | Admitting: Family Medicine

## 2020-05-31 ENCOUNTER — Emergency Department (HOSPITAL_COMMUNITY): Payer: Medicare Other

## 2020-05-31 ENCOUNTER — Other Ambulatory Visit: Payer: Self-pay

## 2020-05-31 ENCOUNTER — Encounter (HOSPITAL_COMMUNITY): Payer: Self-pay | Admitting: Emergency Medicine

## 2020-05-31 DIAGNOSIS — L89890 Pressure ulcer of other site, unstageable: Secondary | ICD-10-CM | POA: Diagnosis present

## 2020-05-31 DIAGNOSIS — F0390 Unspecified dementia without behavioral disturbance: Secondary | ICD-10-CM | POA: Diagnosis present

## 2020-05-31 DIAGNOSIS — L97519 Non-pressure chronic ulcer of other part of right foot with unspecified severity: Secondary | ICD-10-CM | POA: Diagnosis present

## 2020-05-31 DIAGNOSIS — L03116 Cellulitis of left lower limb: Secondary | ICD-10-CM | POA: Diagnosis not present

## 2020-05-31 DIAGNOSIS — Z515 Encounter for palliative care: Secondary | ICD-10-CM | POA: Diagnosis not present

## 2020-05-31 DIAGNOSIS — Z79899 Other long term (current) drug therapy: Secondary | ICD-10-CM

## 2020-05-31 DIAGNOSIS — Z7401 Bed confinement status: Secondary | ICD-10-CM | POA: Diagnosis not present

## 2020-05-31 DIAGNOSIS — L8944 Pressure ulcer of contiguous site of back, buttock and hip, stage 4: Secondary | ICD-10-CM | POA: Diagnosis not present

## 2020-05-31 DIAGNOSIS — I96 Gangrene, not elsewhere classified: Secondary | ICD-10-CM | POA: Diagnosis not present

## 2020-05-31 DIAGNOSIS — A419 Sepsis, unspecified organism: Secondary | ICD-10-CM | POA: Diagnosis not present

## 2020-05-31 DIAGNOSIS — L97529 Non-pressure chronic ulcer of other part of left foot with unspecified severity: Secondary | ICD-10-CM | POA: Diagnosis present

## 2020-05-31 DIAGNOSIS — I5033 Acute on chronic diastolic (congestive) heart failure: Secondary | ICD-10-CM | POA: Diagnosis present

## 2020-05-31 DIAGNOSIS — I110 Hypertensive heart disease with heart failure: Secondary | ICD-10-CM | POA: Diagnosis present

## 2020-05-31 DIAGNOSIS — R531 Weakness: Secondary | ICD-10-CM | POA: Diagnosis not present

## 2020-05-31 DIAGNOSIS — K625 Hemorrhage of anus and rectum: Secondary | ICD-10-CM | POA: Diagnosis present

## 2020-05-31 DIAGNOSIS — F0280 Dementia in other diseases classified elsewhere without behavioral disturbance: Secondary | ICD-10-CM | POA: Diagnosis not present

## 2020-05-31 DIAGNOSIS — L8990 Pressure ulcer of unspecified site, unspecified stage: Secondary | ICD-10-CM | POA: Diagnosis present

## 2020-05-31 DIAGNOSIS — G35 Multiple sclerosis: Secondary | ICD-10-CM | POA: Diagnosis not present

## 2020-05-31 DIAGNOSIS — R6520 Severe sepsis without septic shock: Secondary | ICD-10-CM | POA: Diagnosis not present

## 2020-05-31 DIAGNOSIS — L8995 Pressure ulcer of unspecified site, unstageable: Secondary | ICD-10-CM | POA: Diagnosis not present

## 2020-05-31 DIAGNOSIS — I959 Hypotension, unspecified: Secondary | ICD-10-CM | POA: Diagnosis present

## 2020-05-31 DIAGNOSIS — D62 Acute posthemorrhagic anemia: Secondary | ICD-10-CM | POA: Diagnosis not present

## 2020-05-31 DIAGNOSIS — M869 Osteomyelitis, unspecified: Secondary | ICD-10-CM | POA: Diagnosis present

## 2020-05-31 DIAGNOSIS — L089 Local infection of the skin and subcutaneous tissue, unspecified: Secondary | ICD-10-CM | POA: Diagnosis not present

## 2020-05-31 DIAGNOSIS — Z8249 Family history of ischemic heart disease and other diseases of the circulatory system: Secondary | ICD-10-CM | POA: Diagnosis not present

## 2020-05-31 DIAGNOSIS — J90 Pleural effusion, not elsewhere classified: Secondary | ICD-10-CM | POA: Diagnosis not present

## 2020-05-31 DIAGNOSIS — M87875 Other osteonecrosis, left foot: Secondary | ICD-10-CM | POA: Diagnosis not present

## 2020-05-31 DIAGNOSIS — E870 Hyperosmolality and hypernatremia: Secondary | ICD-10-CM | POA: Diagnosis present

## 2020-05-31 DIAGNOSIS — E872 Acidosis, unspecified: Secondary | ICD-10-CM | POA: Diagnosis present

## 2020-05-31 DIAGNOSIS — L89153 Pressure ulcer of sacral region, stage 3: Secondary | ICD-10-CM | POA: Diagnosis not present

## 2020-05-31 DIAGNOSIS — R000 Tachycardia, unspecified: Secondary | ICD-10-CM | POA: Diagnosis not present

## 2020-05-31 DIAGNOSIS — Z89611 Acquired absence of right leg above knee: Secondary | ICD-10-CM | POA: Diagnosis not present

## 2020-05-31 DIAGNOSIS — Z89612 Acquired absence of left leg above knee: Secondary | ICD-10-CM | POA: Diagnosis not present

## 2020-05-31 DIAGNOSIS — Z4781 Encounter for orthopedic aftercare following surgical amputation: Secondary | ICD-10-CM | POA: Diagnosis not present

## 2020-05-31 DIAGNOSIS — E876 Hypokalemia: Secondary | ICD-10-CM | POA: Diagnosis not present

## 2020-05-31 DIAGNOSIS — G9340 Encephalopathy, unspecified: Secondary | ICD-10-CM | POA: Diagnosis not present

## 2020-05-31 DIAGNOSIS — M2550 Pain in unspecified joint: Secondary | ICD-10-CM | POA: Diagnosis not present

## 2020-05-31 DIAGNOSIS — Z20822 Contact with and (suspected) exposure to covid-19: Secondary | ICD-10-CM | POA: Diagnosis not present

## 2020-05-31 DIAGNOSIS — Z9049 Acquired absence of other specified parts of digestive tract: Secondary | ICD-10-CM

## 2020-05-31 DIAGNOSIS — L8945 Pressure ulcer of contiguous site of back, buttock and hip, unstageable: Secondary | ICD-10-CM | POA: Diagnosis not present

## 2020-05-31 DIAGNOSIS — I70202 Unspecified atherosclerosis of native arteries of extremities, left leg: Secondary | ICD-10-CM | POA: Diagnosis not present

## 2020-05-31 DIAGNOSIS — I10 Essential (primary) hypertension: Secondary | ICD-10-CM | POA: Diagnosis present

## 2020-05-31 DIAGNOSIS — I70201 Unspecified atherosclerosis of native arteries of extremities, right leg: Secondary | ICD-10-CM | POA: Diagnosis not present

## 2020-05-31 DIAGNOSIS — G309 Alzheimer's disease, unspecified: Secondary | ICD-10-CM | POA: Diagnosis not present

## 2020-05-31 DIAGNOSIS — M86152 Other acute osteomyelitis, left femur: Secondary | ICD-10-CM | POA: Diagnosis not present

## 2020-05-31 DIAGNOSIS — M86171 Other acute osteomyelitis, right ankle and foot: Secondary | ICD-10-CM | POA: Diagnosis not present

## 2020-05-31 LAB — CBC WITH DIFFERENTIAL/PLATELET
Abs Immature Granulocytes: 0.11 10*3/uL — ABNORMAL HIGH (ref 0.00–0.07)
Basophils Absolute: 0 10*3/uL (ref 0.0–0.1)
Basophils Relative: 0 %
Eosinophils Absolute: 0.1 10*3/uL (ref 0.0–0.5)
Eosinophils Relative: 0 %
HCT: 37.7 % (ref 36.0–46.0)
Hemoglobin: 10.5 g/dL — ABNORMAL LOW (ref 12.0–15.0)
Immature Granulocytes: 1 %
Lymphocytes Relative: 12 %
Lymphs Abs: 1.9 10*3/uL (ref 0.7–4.0)
MCH: 25.1 pg — ABNORMAL LOW (ref 26.0–34.0)
MCHC: 27.9 g/dL — ABNORMAL LOW (ref 30.0–36.0)
MCV: 90.2 fL (ref 80.0–100.0)
Monocytes Absolute: 0.8 10*3/uL (ref 0.1–1.0)
Monocytes Relative: 5 %
Neutro Abs: 12.3 10*3/uL — ABNORMAL HIGH (ref 1.7–7.7)
Neutrophils Relative %: 82 %
Platelets: 179 10*3/uL (ref 150–400)
RBC: 4.18 MIL/uL (ref 3.87–5.11)
RDW: 16.7 % — ABNORMAL HIGH (ref 11.5–15.5)
WBC: 15.2 10*3/uL — ABNORMAL HIGH (ref 4.0–10.5)
nRBC: 0 % (ref 0.0–0.2)

## 2020-05-31 LAB — COMPREHENSIVE METABOLIC PANEL
ALT: 10 U/L (ref 0–44)
AST: 14 U/L — ABNORMAL LOW (ref 15–41)
Albumin: 1.8 g/dL — ABNORMAL LOW (ref 3.5–5.0)
Alkaline Phosphatase: 107 U/L (ref 38–126)
Anion gap: 8 (ref 5–15)
BUN: 10 mg/dL (ref 8–23)
CO2: 23 mmol/L (ref 22–32)
Calcium: 8.6 mg/dL — ABNORMAL LOW (ref 8.9–10.3)
Chloride: 108 mmol/L (ref 98–111)
Creatinine, Ser: 0.54 mg/dL (ref 0.44–1.00)
GFR calc Af Amer: >60 mL/min (ref >60)
GFR calc non Af Amer: >60 mL/min (ref >60)
Glucose, Bld: 94 mg/dL (ref 70–99)
Potassium: 4.2 mmol/L (ref 3.5–5.1)
Sodium: 139 mmol/L (ref 135–145)
Total Bilirubin: 0.8 mg/dL (ref 0.3–1.2)
Total Protein: 7.4 g/dL (ref 6.5–8.1)

## 2020-05-31 LAB — LACTIC ACID, PLASMA: Lactic Acid, Venous: 2.4 mmol/L (ref 0.5–1.9)

## 2020-05-31 MED ORDER — VANCOMYCIN HCL 500 MG/100ML IV SOLN
500.0000 mg | Freq: Two times a day (BID) | INTRAVENOUS | Status: AC
  Administered 2020-06-01 – 2020-06-03 (×6): 500 mg via INTRAVENOUS
  Filled 2020-05-31 (×6): qty 100

## 2020-05-31 MED ORDER — SODIUM CHLORIDE 0.9 % IV BOLUS
500.0000 mL | Freq: Once | INTRAVENOUS | Status: AC
  Administered 2020-05-31: 500 mL via INTRAVENOUS

## 2020-05-31 MED ORDER — VANCOMYCIN HCL IN DEXTROSE 1-5 GM/200ML-% IV SOLN: 1000.0000 mg | Freq: Once | INTRAVENOUS | Status: DC

## 2020-05-31 MED ORDER — SODIUM CHLORIDE 0.9 % IV SOLN: INTRAVENOUS | Status: DC

## 2020-05-31 MED ORDER — SODIUM CHLORIDE 0.9 % IV SOLN
2.0000 g | Freq: Once | INTRAVENOUS | Status: AC
  Administered 2020-05-31: 2 g via INTRAVENOUS
  Filled 2020-05-31: qty 2

## 2020-05-31 MED ORDER — SODIUM CHLORIDE 0.9 % IV SOLN
2.0000 g | Freq: Two times a day (BID) | INTRAVENOUS | Status: AC
  Administered 2020-06-01 – 2020-06-03 (×6): 2 g via INTRAVENOUS
  Filled 2020-05-31 (×6): qty 2

## 2020-05-31 MED ORDER — LACTATED RINGERS IV BOLUS (SEPSIS)
1000.0000 mL | Freq: Once | INTRAVENOUS | Status: AC
  Administered 2020-05-31: 1000 mL via INTRAVENOUS

## 2020-05-31 MED ORDER — ONDANSETRON HCL 4 MG/2ML IJ SOLN
4.0000 mg | Freq: Once | INTRAMUSCULAR | Status: AC
  Administered 2020-05-31: 4 mg via INTRAVENOUS
  Filled 2020-05-31: qty 2

## 2020-05-31 MED ORDER — VANCOMYCIN HCL 1250 MG/250ML IV SOLN
1250.0000 mg | Freq: Once | INTRAVENOUS | Status: AC
  Administered 2020-05-31: 1250 mg via INTRAVENOUS
  Filled 2020-05-31: qty 250

## 2020-05-31 MED ORDER — SODIUM CHLORIDE 0.9 % IV BOLUS: 1000.0000 mL | Freq: Once | INTRAVENOUS | Status: DC

## 2020-05-31 MED ORDER — SODIUM CHLORIDE 0.9% FLUSH: 3.0000 mL | Freq: Once | INTRAVENOUS | Status: DC

## 2020-05-31 MED ORDER — METRONIDAZOLE IN NACL 5-0.79 MG/ML-% IV SOLN
500.0000 mg | Freq: Once | INTRAVENOUS | Status: AC
  Administered 2020-05-31: 500 mg via INTRAVENOUS
  Filled 2020-05-31: qty 100

## 2020-05-31 NOTE — ED Triage Notes (Signed)
Pt arrives to ED with her daughter due to a chronic poor healing ulcers on right and left foot worse on left. Pt lives with daughter and reports poor appetite and very weak.

## 2020-05-31 NOTE — ED Notes (Signed)
Due to pt hard stick, second set 2nd set of cultures unable to be drawn. abx were not delayed. MD aware and agreed.

## 2020-05-31 NOTE — ED Provider Notes (Signed)
[facility] EMERGENCY DEPARTMENT Provider Note   CSN: [id number] Arrival date & time: 05/31/20  1553     History Chief Complaint  Patient presents with  . Wound Infection    Tracy Armstrong is a 74 y.o. female.  All history provided by daughter.  Patient was admitted to the hospital for sepsis and wound infections was degree of osteomyelitis on July 9 through July 14.  There was consideration for bilateral below the knee amputations by Dr. [name].  But they got canceled due to her overall health.  Past medical history significant for MS dementia hypertension and bilateral foot infections.  Left worse than right.  Patient on the antibiotic Keflex which was due to and on July 28.  Patient with palliative hospice care at home but is a full code.  Daughter contacted Dr. Due to he knew he she was coming in.  Is planning on seeing her in the hospital tomorrow.  As well the daughter stated there was poor appetite and very weak very similar to her last admission.        Past Medical History:  Diagnosis Date  . Multiple sclerosis (HCC) 2009    Patient Active Problem List   Diagnosis Date Noted  . Gangrene (HCC) 05/31/2020  . Cellulitis of left foot 05/31/2020  . Subacute osteomyelitis, left ankle and foot (HCC)   . Palliative care by specialist   . Goals of care, counseling/discussion   . DNR (do not resuscitate) discussion   . Sepsis (HCC) 05/13/2020  . Multiple sclerosis (HCC) 05/13/2020  . Unstageable pressure injury of skin and tissue (HCC) 05/13/2020  . Dementia without behavioral disturbance (HCC) 05/13/2020  . HTN (hypertension) 05/13/2020  . Lactic acidosis 05/13/2020    Past Surgical History:  Procedure Laterality Date  . CHOLECYSTECTOMY       OB History   No obstetric history on file.     No family history on file.  Social History   Tobacco Use  . Smoking status: Never Smoker  . Smokeless tobacco: Never Used  Substance Use Topics  .  Alcohol use: No  . Drug use: No    Home Medications Prior to Admission medications   Medication Sig Start Date End Date Taking? Authorizing Provider  cephALEXin (KEFLEX) 500 MG capsule Take 1 capsule (500 mg total) by mouth 3 (three) times daily for 14 days. 05/18/20 06/01/20  Elgergawy, Leana Roe, MD  donepezil (ARICEPT) 10 MG tablet Take 10 mg by mouth at bedtime.  12/28/19   [provider]  doxycycline (VIBRA-TABS) 100 MG tablet Take 1 tablet (100 mg total) by mouth 2 (two) times daily for 14 days. 05/18/20 06/01/20  Elgergawy, Leana Roe, MD  mirtazapine (REMERON) 7.5 MG tablet Take 7.5 mg by mouth at bedtime. 05/02/20   [provider]  morphine 20 MG/5ML solution Take 0.6 mLs (2.4 mg total) by mouth every 4 (four) hours as needed for pain (or dyspnea). Hospice patient 05/18/20   Elgergawy, Leana Roe, MD  pantoprazole (PROTONIX) 40 MG tablet Take 1 tablet (40 mg total) by mouth daily. 05/19/20   Elgergawy, Leana Roe, MD    Allergies    Patient has no known allergies.  Review of Systems   Review of Systems  Unable to perform ROS: Dementia    Physical Exam Updated Vital Signs BP (!) 88/65   Pulse (!) 129   Temp 97.8 F (36.6 C) (Oral)   Resp 22   Ht 1.676 m (5\' 6" )  Wt 63 kg   SpO2 96%   BMI 22.42 kg/m   Physical Exam Vitals and nursing note reviewed.  Constitutional:      General: She is not in acute distress.    Appearance: She is well-developed.  HENT:     Head: Normocephalic and atraumatic.  Eyes:     Extraocular Movements: Extraocular movements intact.     Conjunctiva/sclera: Conjunctivae normal.     Pupils: Pupils are equal, round, and reactive to light.  Cardiovascular:     Rate and Rhythm: Normal rate and regular rhythm.     Heart sounds: No murmur heard.   Pulmonary:     Effort: Pulmonary effort is normal. No respiratory distress.     Breath sounds: Normal breath sounds.  Abdominal:     Palpations: Abdomen is soft.     Tenderness: There is no  abdominal tenderness.  Musculoskeletal:     Cervical back: Neck supple.     Comments: Patient's left foot gangrenous in nature with peeling skin to the bottom of the foot and large open wounds.  Right foot with wounds mostly around the heel but has pretty good cap refill to the toes.  Everything is ankle and below.  Skin:    General: Skin is warm and dry.  Neurological:     Mental Status: She is alert. Mental status is at baseline.     Comments: According to daughter patient's mental status is baseline.     ED Results / Procedures / Treatments   Labs (all labs ordered are listed, but only abnormal results are displayed) Labs Reviewed  LACTIC ACID, PLASMA - Abnormal; Notable for the following components:      Result Value   Lactic Acid, Venous 2.4 (*)    All other components within normal limits  COMPREHENSIVE METABOLIC PANEL - Abnormal; Notable for the following components:   Calcium 8.6 (*)    Albumin 1.8 (*)    AST 14 (*)    All other components within normal limits  CBC WITH DIFFERENTIAL/PLATELET - Abnormal; Notable for the following components:   WBC 15.2 (*)    Hemoglobin 10.5 (*)    MCH 25.1 (*)    MCHC 27.9 (*)    RDW 16.7 (*)    Neutro Abs 12.3 (*)    Abs Immature Granulocytes 0.11 (*)    All other components within normal limits  CULTURE, BLOOD (ROUTINE X 2)  CULTURE, BLOOD (ROUTINE X 2)  URINE CULTURE  SARS CORONAVIRUS 2 BY RT PCR (HOSPITAL ORDER, PERFORMED IN [facility])  LACTIC ACID, PLASMA  URINALYSIS, ROUTINE W REFLEX MICROSCOPIC  APTT  PROTIME-INR    EKG EKG Interpretation  Date/Time:  Tuesday May 31 2020 21:54:33 EDT Ventricular Rate:  125 PR Interval:    QRS Duration: 92 QT Interval:  307 QTC Calculation: 445 R Axis:   32 Text Interpretation: Sinus tachycardia Posterior infarct, old Borderline repolarization abnormality Artifact in lead(s) I aVR aVL aVF V1 V2 V3 V4 V5 V6 Confirmed by Vanetta Mulders [phone]) on 05/31/2020 10:31:01  PM   Radiology DG Chest Port 1 View  Result Date: 05/31/2020 CLINICAL DATA:  Sepsis EXAM: PORTABLE CHEST 1 VIEW COMPARISON:  05/13/2020 FINDINGS: No focal opacity or pleural effusion. Normal cardiomediastinal silhouette. No pneumothorax. IMPRESSION: No active disease. Electronically Signed   By: Jasmine Pang M.D.   On: 05/31/2020 22:44    Procedures Procedures (including critical care time)  CRITICAL CARE Performed by: Vanetta Mulders Total critical care time:  60 minutes Critical care time was exclusive of separately billable procedures and treating other patients. Critical care was necessary to treat or prevent imminent or life-threatening deterioration. Critical care was time spent personally by me on the following activities: development of treatment plan with patient and/or surrogate as well as nursing, discussions with consultants, evaluation of patient's response to treatment, examination of patient, obtaining history from patient or surrogate, ordering and performing treatments and interventions, ordering and review of laboratory studies, ordering and review of radiographic studies, pulse oximetry and re-evaluation of patient's condition.   Medications Ordered in ED Medications  sodium chloride flush (NS) 0.9 % injection 3 mL (has no administration in time range)  0.9 %  sodium chloride infusion (has no administration in time range)  vancomycin (VANCOREADY) IVPB 1250 mg/250 mL (1,250 mg Intravenous New Bag/Given 05/31/20 2315)  ceFEPIme (MAXIPIME) 2 g in sodium chloride 0.9 % 100 mL IVPB (has no administration in time range)  vancomycin (VANCOREADY) IVPB 500 mg/100 mL (has no administration in time range)  lactated ringers bolus 1,000 mL (has no administration in time range)    And  lactated ringers bolus 1,000 mL (has no administration in time range)  sodium chloride 0.9 % bolus 1,000 mL (has no administration in time range)  ceFEPIme (MAXIPIME) 2 g in sodium chloride 0.9 % 100  mL IVPB (0 g Intravenous Stopped 05/31/20 2313)  metroNIDAZOLE (FLAGYL) IVPB 500 mg (0 mg Intravenous Stopped 05/31/20 2335)  sodium chloride 0.9 % bolus 500 mL (0 mLs Intravenous Stopped 05/31/20 2331)    ED Course  I have reviewed the triage vital signs and the nursing notes.  Pertinent labs & imaging results that were available during my care of the patient were reviewed by me and considered in my medical decision making (see chart for details).    MDM Rules/Calculators/A&P                          Patient here arriving with vital signs consistent with sepsis parameters.  Lactic acid elevated some but below 4.  And also white count is greater than 14,000.  I think everything has to do with the foot infection particularly the left gangrenous foot.  Patient started on broad-spectrum antibiotics based on this chest x-ray is negative.  Patient has not had any of the Covid vaccines Covid testing ending.  His blood pressures have been good with systolics in the 100-1 15 range.  But remains tachycardic.  Patient's mental status is baseline according to daughter.   Patient was on the hospitalist service before they were contacted again for readmission.  In addition on-call for Dr. Audrie Lia group was contacted and Dr. [name] will see the patient in consultation in the morning.   X-rays of both feet are pending.  Final Clinical Impression(s) / ED Diagnoses Final diagnoses:  Sepsis, due to unspecified organism, unspecified whether acute organ dysfunction present [facility])  Gangrene of left foot [facility])    Rx / DC Orders ED Discharge Orders    None       Vanetta Mulders, MD 05/31/20 [phone]

## 2020-05-31 NOTE — H&P (Addendum)
PCP:   Neale Burly, MD   Chief Complaint:  Worsening lower extremity ulcers  HPI: This is a 74 year old female who is recently admitted, discharged on 7/14 with a diagnosis of coag negative staph bacteremia from left lower extremity cellulitis with osteomyelitis that is chronic as well as chronic decubiti right lower extremity.  She was discharged home with working on hospice and oral antibiotics.  During the patient there was discussion with regarding bilateral BKA however after discussion with palliative this was not done given the patient many comorbidities.  She was discharged with treatment and goals directed towards comfort.  She presents today with worsening left lower extremity decubital/ulcer.  Per daughter was present at bedside hospice states they have done everything they can.  Daughter was advised to bring the patient back to the ER.  Patient left lower extremity appears gangrenous.  There is no report of any fevers, chills or significant pain at home.  The patient does continue a poor but improved appetite.  Per daughter the patient had met the worsening confusion.  In the ER she developed nauseaandvomiting.   Prior to coming to the ER the patient spoke with Dr. Sharol Given, he is aware she has been hospitalized and states he will see her in the a.m.  History provided by patient's daughter.  Review of Systems:  The patient denies anorexia, fever, weight loss,, vision loss, decreased hearing, hoarseness, chest pain, syncope, dyspnea on exertion, peripheral edema, balance deficits, hemoptysis, abdominal pain, melena, hematochezia, severe indigestion/heartburn, hematuria, incontinence, genital sores, muscle weakness, suspicious skin lesions, transient blindness, difficulty walking, depression, unusual weight change, abnormal bleeding, enlarged lymph nodes, angioedema, and breast masses.  Positive for: Lower extremity gangrene, nausea and vomiting  Past Medical History: Past Medical History:   Diagnosis Date  . Multiple sclerosis ([place]) 2009   Past Surgical History:  Procedure Laterality Date  . CHOLECYSTECTOMY      Medications: Prior to Admission medications   Medication Sig Start Date End Date Taking? Authorizing Provider  cephALEXin (KEFLEX) 500 MG capsule Take 1 capsule (500 mg total) by mouth 3 (three) times daily for 14 days. 05/18/20 06/01/20  Elgergawy, Silver Huguenin, MD  donepezil (ARICEPT) 10 MG tablet Take 10 mg by mouth at bedtime.  12/28/19   [provider]  doxycycline (VIBRA-TABS) 100 MG tablet Take 1 tablet (100 mg total) by mouth 2 (two) times daily for 14 days. 05/18/20 06/01/20  Elgergawy, Silver Huguenin, MD  mirtazapine (REMERON) 7.5 MG tablet Take 7.5 mg by mouth at bedtime. 05/02/20   [provider]  morphine 20 MG/5ML solution Take 0.6 mLs (2.4 mg total) by mouth every 4 (four) hours as needed for pain (or dyspnea). Hospice patient 05/18/20   Elgergawy, Silver Huguenin, MD  pantoprazole (PROTONIX) 40 MG tablet Take 1 tablet (40 mg total) by mouth daily. 05/19/20   Elgergawy, Silver Huguenin, MD    Allergies:  No Known Allergies  Social History:  reports that she has never smoked. She has never used smokeless tobacco. She reports that she does not drink alcohol and does not use drugs.  Family History:  hypertension  Physical Exam: Vitals:   05/31/20 2130 05/31/20 2230 05/31/20 2300 05/31/20 2317  BP: (!) 117/88 113/83  116/72  Pulse: (!) 128 (!) 122  (!) 116  Resp: 21 (!) 26  17  Temp:      TempSrc:      SpO2: 98% 95%  98%  Weight:   63 kg   Height:  $'5\' 6"'s$  (1.676 m)     General:  Alert and oriented times three, well developed and nourished, no acute distress Eyes: PERRLA, pink conjunctiva, no scleral icterus ENT: Moist oral mucosa, neck supple, no thyromegaly Lungs: clear to ascultation, no wheeze, no crackles, no use of accessory muscles Cardiovascular: regular rate and rhythm, no regurgitation, no gallops, no murmurs. No carotid bruits, no  JVD Abdomen: soft, positive BS, non-tender, non-distended, no organomegaly, not an acute abdomen GU: not examined Neuro: CN II - XII grossly intact, sensation intact Musculoskeletal: strength 5/5 all extremities, no clubbing, cyanosis or edema Skin: no rash, no subcutaneous crepitation, no decubitus Psych: appropriate patient Gen: cachectic female Ext: wet gangrene left foot. Right foot with deep stage 4 decubitus ulcer. Sacral decub  Labs on Admission:  Recent Labs    05/31/20 1633  NA 139  K 4.2  CL 108  CO2 23  GLUCOSE 94  BUN 10  CREATININE 0.54  CALCIUM 8.6*   Recent Labs    05/31/20 1633  AST 14*  ALT 10  ALKPHOS 107  BILITOT 0.8  PROT 7.4  ALBUMIN 1.8*   No results for input(s): LIPASE, AMYLASE in the last 72 hours. Recent Labs    05/31/20 1633  WBC 15.2*  NEUTROABS 12.3*  HGB 10.5*  HCT 37.7  MCV 90.2  PLT 179   No results for input(s): CKTOTAL, CKMB, CKMBINDEX, TROPONINI in the last 72 hours. Invalid input(s): POCBNP No results for input(s): DDIMER in the last 72 hours. No results for input(s): HGBA1C in the last 72 hours. No results for input(s): CHOL, HDL, LDLCALC, TRIG, CHOLHDL, LDLDIRECT in the last 72 hours. No results for input(s): TSH, T4TOTAL, T3FREE, THYROIDAB in the last 72 hours.  Invalid input(s): FREET3 No results for input(s): VITAMINB12, FOLATE, FERRITIN, TIBC, IRON, RETICCTPCT in the last 72 hours.  Micro Results: No results found for this or any previous visit (from the past 240 hour(s)).   Radiological Exams on Admission: DG Chest Port 1 View  Result Date: 05/31/2020 CLINICAL DATA:  Sepsis EXAM: PORTABLE CHEST 1 VIEW COMPARISON:  05/13/2020 FINDINGS: No focal opacity or pleural effusion. Normal cardiomediastinal silhouette. No pneumothorax. IMPRESSION: No active disease. Electronically Signed   By: Donavan Foil M.D.   On: 05/31/2020 22:44    Assessment/Plan Present on Admission: . Gangrene ([place]) . Cellulitis of left  foot . Lactic acidosis -Admit to med telemetry -Blood cultures x2 collected -IV antibiotics cefepime and Vanco ordered, pharmacy to dose -Orthopedic consult placed by ER physician\ -Patient was treated initially for sepsis with fluid bolus and IV antibiotics. -N.p.o. at midnight  . HTN (hypertension) -Second episode of hypotension.  Home medications resumed with hold parameters.  . Dementia without behavioral disturbance (HCC) -Mild per daughter.  Benazepril ordered  . Unstageable pressure injury of skin and tissue (HCC) Bilateral lower extremity and sacrum.  Wound care consult placed  Stacia Feazell 05/31/2020, 11:31 PM

## 2020-05-31 NOTE — Progress Notes (Signed)
Pharmacy Antibiotic Note  Tracy Armstrong is a 74 y.o. female admitted on 05/31/2020 with sepsis.  Pharmacy has been consulted for Cefepime and Vancomycin dosing.   Height: 5\' 6"  (167.6 cm) Weight: 63 kg (138 lb 14.2 oz) IBW/kg (Calculated) : 59.3  Temp (24hrs), Avg:97.8 F (36.6 C), Min:97.8 F (36.6 C), Max:97.8 F (36.6 C)  Recent Labs  Lab 05/31/20 1633  WBC 15.2*  CREATININE 0.54  LATICACIDVEN 2.4*    Estimated Creatinine Clearance: 59.5 mL/min (by C-G formula based on SCr of 0.54 mg/dL).    No Known Allergies  Antimicrobials this admission: 7/27 Cefepime >>  7/27 Vancomycin >>   Dose adjustments this admission: N/a  Microbiology results: Pending   Plan:  - Cefepime 2g IV q12h - Vancomycin 1250mg  IV x 1 dose  - Followed by Vancomycin 500mg  IV q12h ( nomogram dosing)  - Monitor patients renal function and urine output  - De-escalate ABX when appropriate   Thank you for allowing pharmacy to be a part of this patient's care.  8/27 PharmD. BCPS 05/31/2020 11:04 PM

## 2020-05-31 NOTE — ED Notes (Signed)
Pts blood cultures were drawn by IV Team and sent to the lab. IV antibiotics were started after cultures were sent to the lab, however, the primary RN noticed the did not click off the labs and clicked them off.

## 2020-06-01 ENCOUNTER — Other Ambulatory Visit: Payer: Self-pay | Admitting: Physician Assistant

## 2020-06-01 DIAGNOSIS — A419 Sepsis, unspecified organism: Principal | ICD-10-CM

## 2020-06-01 DIAGNOSIS — F0280 Dementia in other diseases classified elsewhere without behavioral disturbance: Secondary | ICD-10-CM

## 2020-06-01 DIAGNOSIS — G9340 Encephalopathy, unspecified: Secondary | ICD-10-CM

## 2020-06-01 DIAGNOSIS — G309 Alzheimer's disease, unspecified: Secondary | ICD-10-CM

## 2020-06-01 DIAGNOSIS — I10 Essential (primary) hypertension: Secondary | ICD-10-CM

## 2020-06-01 DIAGNOSIS — L8995 Pressure ulcer of unspecified site, unstageable: Secondary | ICD-10-CM

## 2020-06-01 DIAGNOSIS — L03116 Cellulitis of left lower limb: Secondary | ICD-10-CM

## 2020-06-01 DIAGNOSIS — R6520 Severe sepsis without septic shock: Secondary | ICD-10-CM

## 2020-06-01 DIAGNOSIS — Z515 Encounter for palliative care: Secondary | ICD-10-CM

## 2020-06-01 DIAGNOSIS — E872 Acidosis: Secondary | ICD-10-CM

## 2020-06-01 LAB — CBC
HCT: 31.9 % — ABNORMAL LOW (ref 36.0–46.0)
Hemoglobin: 9.3 g/dL — ABNORMAL LOW (ref 12.0–15.0)
MCH: 26.3 pg (ref 26.0–34.0)
MCHC: 29.2 g/dL — ABNORMAL LOW (ref 30.0–36.0)
MCV: 90.4 fL (ref 80.0–100.0)
Platelets: 128 10*3/uL — ABNORMAL LOW (ref 150–400)
RBC: 3.53 MIL/uL — ABNORMAL LOW (ref 3.87–5.11)
RDW: 16.5 % — ABNORMAL HIGH (ref 11.5–15.5)
WBC: 12.5 10*3/uL — ABNORMAL HIGH (ref 4.0–10.5)
nRBC: 0 % (ref 0.0–0.2)

## 2020-06-01 LAB — LACTIC ACID, PLASMA: Lactic Acid, Venous: 1.1 mmol/L (ref 0.5–1.9)

## 2020-06-01 LAB — BASIC METABOLIC PANEL
Anion gap: 8 (ref 5–15)
BUN: 13 mg/dL (ref 8–23)
CO2: 23 mmol/L (ref 22–32)
Calcium: 8.1 mg/dL — ABNORMAL LOW (ref 8.9–10.3)
Chloride: 108 mmol/L (ref 98–111)
Creatinine, Ser: 0.51 mg/dL (ref 0.44–1.00)
GFR calc Af Amer: >60 mL/min (ref >60)
GFR calc non Af Amer: >60 mL/min (ref >60)
Glucose, Bld: 105 mg/dL — ABNORMAL HIGH (ref 70–99)
Potassium: 3.9 mmol/L (ref 3.5–5.1)
Sodium: 139 mmol/L (ref 135–145)

## 2020-06-01 LAB — MAGNESIUM: Magnesium: 1.7 mg/dL (ref 1.7–2.4)

## 2020-06-01 LAB — SARS CORONAVIRUS 2 BY RT PCR (HOSPITAL ORDER, PERFORMED IN ~~LOC~~ HOSPITAL LAB): SARS Coronavirus 2: NEGATIVE

## 2020-06-01 MED ORDER — ACETAMINOPHEN 325 MG PO TABS
650.0000 mg | ORAL_TABLET | Freq: Four times a day (QID) | ORAL | Status: DC | PRN
  Administered 2020-06-06 – 2020-06-07 (×2): 650 mg via ORAL
  Filled 2020-06-01 (×2): qty 2

## 2020-06-01 MED ORDER — ACETAMINOPHEN 650 MG RE SUPP: 650.0000 mg | Freq: Four times a day (QID) | RECTAL | Status: DC | PRN

## 2020-06-01 MED ORDER — HEPARIN SODIUM (PORCINE) 5000 UNIT/ML IJ SOLN
5000.0000 [IU] | Freq: Three times a day (TID) | INTRAMUSCULAR | Status: DC
  Administered 2020-06-01 – 2020-06-06 (×14): 5000 [IU] via SUBCUTANEOUS
  Filled 2020-06-01 (×14): qty 1

## 2020-06-01 MED ORDER — ONDANSETRON HCL 4 MG/2ML IJ SOLN
4.0000 mg | Freq: Four times a day (QID) | INTRAMUSCULAR | Status: DC | PRN
  Administered 2020-06-11: 4 mg via INTRAVENOUS
  Filled 2020-06-01: qty 2

## 2020-06-01 NOTE — Progress Notes (Signed)
PROGRESS NOTE  Tracy Armstrong JKD:[id number] DOB: February 07, 1946 DOA: 05/31/2020 PCP: [name], MD   LOS: 1 day   Brief narrative: As per HPI,  This is a 74 year old female with past medical history of multiple sclerosis, dementia, hypertension and chronic bedbound status who was recently  discharged on 7/14 with a diagnosis of coag negative staph bacteremia from left lower extremity cellulitis with osteomyelitis that is chronic as well as chronic decubiti right lower extremity.  She was discharged home with working on hospice and oral antibiotics.    During the previous admission there was discussion about bilateral BKA however after discussion with palliative this was not done given the patient many comorbidities.  She was discharged with treatment and goals directed towards comfort.  Patient presented this time with worsening lower extremity decubital/ulcer.  Per daughter was present at bedside hospice states they have done everything they can.  Daughter was advised to bring the patient back to the ER.  Patient's left lower extremity appeared gangrenous.  Prior to coming to the ER the patient spoke with Dr. [name], orthopedics.  In the ED, patient was given broad-spectrum antibiotic.  Patient was then admitted to hospital for further evaluation and treatment.  Assessment/Plan:  Principal Problem:   Gangrene (HCC) Active Problems:   Sepsis (HCC)   Unstageable pressure injury of skin and tissue (HCC)   Dementia without behavioral disturbance (HCC)   HTN (hypertension)   Lactic acidosis   Palliative care by specialist   Cellulitis of left foot  left lower extremity gangrene/cellulitis.   Patient received a septic fluid bolus in the ED and has been started on broad-spectrum iV antibiotic.   Follow orthopedic recommendations.  Follow blood cultures. Notified Dr [name].  History of multiple sclerosis bedbound status and severe limb contractures.  Continue supportive care. Very advanced  disease.  Currently on hospice care   Unstageable pressure injury of skin and tissue  -Multiple pressure ulcers over the sacral/ buttocks area and lower extremities.  Present on admission.  Wound care has been consulted as well.   Dementia without behavioral disturbance  Continue Aricept.  Supportive care.  Essential hypertension. Patient received IV fluid for marginally low blood pressure.  Continue to hold antihypertensives.  Lactic acidosis Improved after fluid bolus.  Latest lactic acid of 1.1.  DVT prophylaxis: heparin injection 5,000 Units Start: 06/01/20 0030  Code Status: Full code  Family Communication: With the patient's daughter at bedside  Status is: Inpatient  Remains inpatient appropriate because:Unsafe d/c plan, IV treatments appropriate due to intensity of illness or inability to take PO and Inpatient level of care appropriate due to severity of illness, possible need for amputation    Dispo: The patient is from: Home hospice              Anticipated d/c is to: Home hospice              Anticipated d/c date is: 3 days              Patient currently is not medically stable to d/c.  Consultants:  Orthopedics Dr. [name]  Procedures:  None so far  Antibiotics:  . Vancomycin and cefepime  Anti-infectives (From admission, onward)   Start     Dose/Rate Route Frequency Ordered Stop   06/01/20 1200  vancomycin (VANCOREADY) IVPB 500 mg/100 mL     Discontinue     500 mg 100 mL/hr over 60 Minutes Intravenous Every 12 hours 05/31/20 2304  06/01/20 1030  ceFEPIme (MAXIPIME) 2 g in sodium chloride 0.9 % 100 mL IVPB     Discontinue     2 g 200 mL/hr over 30 Minutes Intravenous Every 12 hours 05/31/20 2304     05/31/20 2215  vancomycin (VANCOREADY) IVPB 1250 mg/250 mL        1,250 mg 166.7 mL/hr over 90 Minutes Intravenous  Once 05/31/20 2200 06/01/20 0047   05/31/20 2200  ceFEPIme (MAXIPIME) 2 g in sodium chloride 0.9 % 100 mL IVPB        2 g 200 mL/hr  over 30 Minutes Intravenous  Once 05/31/20 2151 05/31/20 2313   05/31/20 2200  metroNIDAZOLE (FLAGYL) IVPB 500 mg        500 mg 100 mL/hr over 60 Minutes Intravenous  Once 05/31/20 2151 05/31/20 2335   05/31/20 2200  vancomycin (VANCOCIN) IVPB 1000 mg/200 mL premix  Status:  Discontinued        1,000 mg 200 mL/hr over 60 Minutes Intravenous  Once 05/31/20 2151 05/31/20 2200     Subjective: Today, patient was seen and examined at bedside.  Poor historian, patient denies any overt leg pain.  Denies shortness of breath fever chills or rigor.  Objective: Vitals:   06/01/20 0516 06/01/20 0632  BP: 114/71 (!) 108/64  Pulse: 98 92  Resp: 17 17  Temp:    SpO2: 94% 100%   No intake or output data in the 24 hours ending 06/01/20 0813 Filed Weights   05/31/20 2300  Weight: 63 kg   Body mass index is 22.42 kg/m.   Physical Exam: GENERAL: Patient is alert awake and communicative. Not in obvious distress.  Thinly built. HENT: Mild pallor noted.  Pupils equally reactive to light. Oral mucosa is moist NECK: is supple, no gross swelling noted. CHEST: Clear to auscultation. No crackles or wheezes.  Diminished breath sounds bilaterally. CVS: S1 and S2 heard, no murmur. Regular rate and rhythm.  ABDOMEN: Soft, non-tender, bowel sounds are present. EXTREMITIES: Gangrene of the left foot.  Right foot with deep stage IV decubitus ulceration.  Cool to palpation of the left foot. CNS: Cranial nerves are intact.  Chronic bedbound status. SKIN: Decubitus ulceration present on admission.  Left foot ischemic gangrene.  Data Review: I have personally reviewed the following laboratory data and studies,  CBC: Recent Labs  Lab 05/31/20 1633 06/01/20 0514  WBC 15.2* 12.5*  NEUTROABS 12.3*  --   HGB 10.5* 9.3*  HCT 37.7 31.9*  MCV 90.2 90.4  PLT 179 128*   Basic Metabolic Panel: Recent Labs  Lab 05/31/20 1633 06/01/20 0514  NA 139 139  K 4.2 3.9  CL 108 108  CO2 23 23  GLUCOSE 94 105*    BUN 10 13  CREATININE 0.54 0.51  CALCIUM 8.6* 8.1*  MG  --  1.7   Liver Function Tests: Recent Labs  Lab 05/31/20 1633  AST 14*  ALT 10  ALKPHOS 107  BILITOT 0.8  PROT 7.4  ALBUMIN 1.8*   No results for input(s): LIPASE, AMYLASE in the last 168 hours. No results for input(s): AMMONIA in the last 168 hours. Cardiac Enzymes: No results for input(s): CKTOTAL, CKMB, CKMBINDEX, TROPONINI in the last 168 hours. BNP (last 3 results) Recent Labs    05/15/20 0750 05/16/20 0659 05/17/20 0435  BNP 149.8* 225.9* 99.0    ProBNP (last 3 results) No results for input(s): PROBNP in the last 8760 hours.  CBG: No results for input(s): GLUCAP in the  last 168 hours. Recent Results (from the past 240 hour(s))  SARS Coronavirus 2 by RT PCR (hospital order, performed in [facility] hospital lab) Nasopharyngeal Nasopharyngeal Swab     Status: None   Collection Time: 05/31/20 11:16 PM   Specimen: Nasopharyngeal Swab  Result Value Ref Range Status   SARS Coronavirus 2 NEGATIVE NEGATIVE Final    Comment: (NOTE) SARS-CoV-2 target nucleic acids are NOT DETECTED.  The SARS-CoV-2 RNA is generally detectable in upper and lower respiratory specimens during the acute phase of infection. The lowest concentration of SARS-CoV-2 viral copies this assay can detect is 250 copies / mL. A negative result does not preclude SARS-CoV-2 infection and should not be used as the sole basis for treatment or other patient management decisions.  A negative result may occur with improper specimen collection / handling, submission of specimen other than nasopharyngeal swab, presence of viral mutation(s) within the areas targeted by this assay, and inadequate number of viral copies (<250 copies / mL). A negative result must be combined with clinical observations, patient history, and epidemiological information.  Fact Sheet for Patients:   BoilerBrush.com.cy  Fact Sheet for Healthcare  Providers: https://pope.com/  This test is not yet approved or  cleared by the [place] FDA and has been authorized for detection and/or diagnosis of SARS-CoV-2 by FDA under an Emergency Use Authorization (EUA).  This EUA will remain in effect (meaning this test can be used) for the duration of the COVID-19 declaration under Section 564(b)(1) of the Act, 21 U.S.C. section 360bbb-3(b)(1), unless the authorization is terminated or revoked sooner.  Performed at [facility], [address], [place], [place] [address]      Studies: DG Chest Port 1 View  Result Date: 05/31/2020 CLINICAL DATA:  Sepsis EXAM: PORTABLE CHEST 1 VIEW COMPARISON:  05/13/2020 FINDINGS: No focal opacity or pleural effusion. Normal cardiomediastinal silhouette. No pneumothorax. IMPRESSION: No active disease. Electronically Signed   By: Jasmine Pang M.D.   On: 05/31/2020 22:44      Joycelyn Das, MD  [facility] 06/01/2020

## 2020-06-01 NOTE — Progress Notes (Signed)
Patient has left foot gangrene, right foot and heal full thickness ulcerations, and stage 3 on buttocks. Dressing changed and in place, dry and intact.   Right Buttocks 3x1 Left Buttocks 2x2 Stage 3 left buttocks 5x2 0.7 Right leg 2x2 Right outer leg 2x3 Right lower outer leg 2x2 Right heel 3x3 Right Ankle 5x3 Right outer heel 4x3 Right upper thigh 2x1

## 2020-06-01 NOTE — Consult Note (Signed)
WOC Nurse wound consult note Consultation was completed by review of records, images and assistance from the bedside nurse/clinical staff.  WOC Nurse has reviewed record and this patient has a positive xray or MRI for osteomyelitis,  Re-consult if only topical wound care needed after orthopedic or surgical evaluation Charistopher Rumble [facility] MSN, RN, [place], CNS, Wonda Cerise [phone]  Reason for Consult: foot ulcers and sacrum Seen by [facility] nursing team 05/14/20 She is severely contracted and was on palliative care at home with MS  Recommended for bilateral BKAs  Per Dr. [name]. L>R wound appearance. From ED provider: Patient's left foot gangrenous in nature with peeling skin to the bottom of the foot and large open wounds.  Right foot with wounds mostly around the heel but has pretty good cap refill to the toes.  Everything is ankle and below.  Sacral wounds clean from image dated 05/13/20. Conservative topical care will be ordered for sacrum and right heel until Dr. [name] can evaluate patient Wound type: Stage 3 Pressure injuries: sacrum Arterial/pressure ulceration; right and left feet; with known osteomyelitis  Needs orthopedic consultation again for the LLE  Pressure Injury POA: Yes Measurement:see nursing flow sheets  Wound bed: necrotic gangrenous left foot; full thickness ulcerations right heel  Drainage (amount, consistency, odor) foul, large amounts noted in the images from the last admission from the left foot; see nursing flow sheets for sacrum and right foot.  Dressing procedure/placement/frequency: Dry dressings to the left foot/heel until orthopedic evaluation Moist saline dressings to the right heel until orthopedic evaluation  Silicone foam to the sacral wounds, lift to inspect each shift for acute changes Air mattress overlay for moisture management and pressure redistribution.  Discussed POC with patient and bedside nurse.  Re consult if needed, will not follow at this time. Thanks  Dena Esperanza  [employer], RN,CWOCN, CNS, CWON-AP [phone])

## 2020-06-01 NOTE — Progress Notes (Signed)
NEW ADMISSION NOTE  Arrival Method: bed Mental Orientation: oriented to self, poor historian Telemetry: yes Assessment: Completed Skin: see notes Iv: right upper arm Pain: 0 Tubes: 0 Safety Measures: Safety Fall Prevention Plan has been given, discussed and signed Admission: Completed [facility] Orientation: Patient has been orientated to the room, unit and staff.  Family: 0  Orders have been reviewed and implemented. Will continue to monitor the patient. Call light has been placed within reach and bed alarm has been activated.  Patient is a poor historian and unable to answer admitting questions  Pat Patrick, RN

## 2020-06-01 NOTE — Sepsis Progress Note (Signed)
Called lab to inquire re repeat lactic acid that was collected at 2307. No lab to be found. Primary team notified. Order placed for repeat lactic acid to be collected again.

## 2020-06-01 NOTE — Progress Notes (Signed)
Patient awake alert to self has sub normal temp 94.8 rectally HR 93 RR 16 B/P 118/68 sats 100% on room air.Text paged Dr. Leafy Half. No new orders received. Patient mews now in yellow will monitor vital signs per protocol.

## 2020-06-02 ENCOUNTER — Telehealth: Payer: Self-pay

## 2020-06-02 DIAGNOSIS — I96 Gangrene, not elsewhere classified: Secondary | ICD-10-CM | POA: Diagnosis not present

## 2020-06-02 LAB — COMPREHENSIVE METABOLIC PANEL
ALT: 11 U/L (ref 0–44)
AST: 14 U/L — ABNORMAL LOW (ref 15–41)
Albumin: 1.3 g/dL — ABNORMAL LOW (ref 3.5–5.0)
Alkaline Phosphatase: 90 U/L (ref 38–126)
Anion gap: 7 (ref 5–15)
BUN: 13 mg/dL (ref 8–23)
CO2: 20 mmol/L — ABNORMAL LOW (ref 22–32)
Calcium: 7.8 mg/dL — ABNORMAL LOW (ref 8.9–10.3)
Chloride: 114 mmol/L — ABNORMAL HIGH (ref 98–111)
Creatinine, Ser: 0.5 mg/dL (ref 0.44–1.00)
GFR calc Af Amer: >60 mL/min (ref >60)
GFR calc non Af Amer: >60 mL/min (ref >60)
Glucose, Bld: 73 mg/dL (ref 70–99)
Potassium: 3.5 mmol/L (ref 3.5–5.1)
Sodium: 141 mmol/L (ref 135–145)
Total Bilirubin: 0.6 mg/dL (ref 0.3–1.2)
Total Protein: 5.6 g/dL — ABNORMAL LOW (ref 6.5–8.1)

## 2020-06-02 LAB — PHOSPHORUS: Phosphorus: 3.3 mg/dL (ref 2.5–4.6)

## 2020-06-02 LAB — CBC
HCT: 28 % — ABNORMAL LOW (ref 36.0–46.0)
Hemoglobin: 8.2 g/dL — ABNORMAL LOW (ref 12.0–15.0)
MCH: 25.8 pg — ABNORMAL LOW (ref 26.0–34.0)
MCHC: 29.3 g/dL — ABNORMAL LOW (ref 30.0–36.0)
MCV: 88.1 fL (ref 80.0–100.0)
Platelets: 122 10*3/uL — ABNORMAL LOW (ref 150–400)
RBC: 3.18 MIL/uL — ABNORMAL LOW (ref 3.87–5.11)
RDW: 16.4 % — ABNORMAL HIGH (ref 11.5–15.5)
WBC: 9.8 10*3/uL (ref 4.0–10.5)
nRBC: 0 % (ref 0.0–0.2)

## 2020-06-02 LAB — MAGNESIUM: Magnesium: 1.6 mg/dL — ABNORMAL LOW (ref 1.7–2.4)

## 2020-06-02 MED ORDER — MAGNESIUM SULFATE 2 GM/50ML IV SOLN
2.0000 g | Freq: Once | INTRAVENOUS | Status: AC
  Administered 2020-06-02: 2 g via INTRAVENOUS
  Filled 2020-06-02: qty 50

## 2020-06-02 NOTE — H&P (View-Only) (Signed)
ORTHOPAEDIC CONSULTATION  REQUESTING PHYSICIAN: Joycelyn Das, MD  Chief Complaint: Worsening gangrenous ulcers of both feet  HPI: Tracy Armstrong is a 74 y.o. female who presents with worsening gangrenous ulcers both feet.  Patient's daughter has called me and stated that while they initially elected to proceed with conservative therapy and hospice at home her daughter states they cannot care for her foot wounds that are getting worse and she would like to proceed with bilateral above-the-knee amputations.  Past Medical History:  Diagnosis Date  . Multiple sclerosis (HCC) 2009   Past Surgical History:  Procedure Laterality Date  . CHOLECYSTECTOMY     Social History   Socioeconomic History  . Marital status: Widowed    Spouse name: Not on file  . Number of children: Not on file  . Years of education: Not on file  . Highest education level: Not on file  Occupational History  . Not on file  Tobacco Use  . Smoking status: Never Smoker  . Smokeless tobacco: Never Used  Substance and Sexual Activity  . Alcohol use: No  . Drug use: No  . Sexual activity: Not on file  Other Topics Concern  . Not on file  Social History Narrative  . Not on file   Social Determinants of Health   Financial Resource Strain:   . Difficulty of Paying Living Expenses:   Food Insecurity:   . Worried About Programme researcher, broadcasting/film/video in the Last Year:   . Barista in the Last Year:   Transportation Needs:   . Freight forwarder (Medical):   Marland Kitchen Lack of Transportation (Non-Medical):   Physical Activity:   . Days of Exercise per Week:   . Minutes of Exercise per Session:   Stress:   . Feeling of Stress :   Social Connections:   . Frequency of Communication with Friends and Family:   . Frequency of Social Gatherings with Friends and Family:   . Attends Religious Services:   . Active Member of Clubs or Organizations:   . Attends Banker Meetings:   Marland Kitchen Marital Status:    No  family history on file. - negative except otherwise stated in the family history section No Known Allergies Prior to Admission medications   Medication Sig Start Date End Date Taking? Authorizing Provider  mirtazapine (REMERON) 7.5 MG tablet Take 7.5 mg by mouth at bedtime. 05/02/20  Yes [provider]  morphine 20 MG/5ML solution Take 0.6 mLs (2.4 mg total) by mouth every 4 (four) hours as needed for pain (or dyspnea). Hospice patient 05/18/20  Yes Elgergawy, Leana Roe, MD  ondansetron (ZOFRAN) 4 MG tablet Take 4 mg by mouth every 6 (six) hours as needed for nausea/vomiting. 05/19/20  Yes [provider]  pantoprazole (PROTONIX) 40 MG tablet Take 1 tablet (40 mg total) by mouth daily. 05/19/20  Yes Elgergawy, Leana Roe, MD   DG Chest Port 1 View  Result Date: 05/31/2020 CLINICAL DATA:  Sepsis EXAM: PORTABLE CHEST 1 VIEW COMPARISON:  05/13/2020 FINDINGS: No focal opacity or pleural effusion. Normal cardiomediastinal silhouette. No pneumothorax. IMPRESSION: No active disease. Electronically Signed   By: Jasmine Pang M.D.   On: 05/31/2020 22:44   - pertinent xrays, CT, MRI studies were reviewed and independently interpreted  Positive ROS: All other systems have been reviewed and were otherwise negative with the exception of those mentioned in the HPI and as above.  Physical Exam: General: Alert, no acute distress Psychiatric:  Patient is competent for consent with normal mood and affect Lymphatic: No axillary or cervical lymphadenopathy Cardiovascular: No pedal edema Respiratory: No cyanosis, no use of accessory musculature GI: No organomegaly, abdomen is soft and non-tender    Images:  @ENCIMAGES @  Labs:  Lab Results  Component Value Date   CRP 18.8 (H) 05/17/2020   CRP 19.1 (H) 05/16/2020   CRP 18.2 (H) 05/15/2020   REPTSTATUS 05/16/2020 FINAL 05/13/2020   REPTSTATUS 05/18/2020 FINAL 05/13/2020   CULT (A) 05/13/2020    STAPHYLOCOCCUS SPECIES (COAGULASE  NEGATIVE) THE SIGNIFICANCE OF ISOLATING THIS ORGANISM FROM A SINGLE SET OF BLOOD CULTURES WHEN MULTIPLE SETS ARE DRAWN IS UNCERTAIN. PLEASE NOTIFY THE MICROBIOLOGY DEPARTMENT WITHIN ONE WEEK IF SPECIATION AND SENSITIVITIES ARE REQUIRED. Performed at [facility], [address] [place], [place]    CULT  05/13/2020    NO GROWTH 5 DAYS Performed at [facility], [address] [place], [place]    [id number] PNEUMONIAE 07/19/2011    Lab Results  Component Value Date   ALBUMIN 1.3 (L) 06/02/2020   ALBUMIN 1.8 (L) 05/31/2020   ALBUMIN 1.3 (L) 05/17/2020    Neurologic: Patient does not have protective sensation bilateral lower extremities.   MUSCULOSKELETAL:   Skin: Examination patient has ischemic necrotic ulcers of both feet.  Patient has significant lower extremity contractures both legs are contracted and abducted her hips have a fixed flexion contracture with her hips flexed and a knee flexion contracture with the heels against her buttocks.  Patient has severe protein caloric malnutrition with an albumin of 1.3.  Assessment: Assessment: Gangrenous changes both feet with hip and knee flexion contractures  Plan: Plan: Patient's daughter wishes to proceed with bilateral above-the-knee amputations.  We will plan for surgery on Friday.  We will discuss this again with the patient's daughter today who will need to reinforce the treatment recommendations with the patient.  Thank you for the consult and the opportunity to see Ms. Sunday, MD [facility] [phone] 8:43 AM

## 2020-06-02 NOTE — Progress Notes (Addendum)
PROGRESS NOTE  Tracy Armstrong XTK:[id number] DOB: 1945/12/11 DOA: 05/31/2020 PCP: [name], MD   LOS: 2 days   Brief narrative: As per HPI,  This is a 74 year old female with past medical history of multiple sclerosis, dementia, hypertension and chronic bedbound status who was recently  discharged on 7/14 with a diagnosis of coag negative staph bacteremia from left lower extremity cellulitis with osteomyelitis that is chronic as well as chronic decubiti right lower extremity.  She was discharged home with home hospice and oral antibiotics.    During the previous admission, there was discussion about  BKA however after discussion with palliative this was not done given the patient many comorbidities.  She was discharged with treatment and goals directed towards comfort.  Patient presented this time with worsening lower extremity decubital/ulcer.  Per daughter, hospice stated that they have done everything they can.  Daughter was advised to bring the patient back to the ER.  Prior to coming to the ER the patient spoke with Dr. [name], orthopedics.  In the ED, patient was given broad-spectrum antibiotics and was then admitted to hospital for further evaluation and treatment.  Assessment/Plan:  Principal Problem:   Gangrene (HCC) Active Problems:   Sepsis (HCC)   Unstageable pressure injury of skin and tissue (HCC)   Dementia without behavioral disturbance (HCC)   HTN (hypertension)   Lactic acidosis   Palliative care by specialist   Cellulitis of left foot  Left lower extremity gangrene/cellulitis.   Patient received a septic fluid bolus in the ED and has been started on broad-spectrum iV antibiotics.   Follow orthopedic recommendations.Dr [name] notified and the plan is surgical intervention on 06/03/2020..   Follow blood cultures.  Currently on vancomycin and cefepime.  Trending down leukocytosis.  Keep n.p.o. after midnight  History of multiple sclerosis bedbound status and severe limb  contractures.  Continue supportive care. Very advanced disease.  Currently on hospice care at home.   Pressure injury of skin and tissue  -Multiple pressure ulcers over the sacral/ buttocks area and lower extremities.  Present on admission.  Wound care has been consulted as well.   Dementia without behavioral disturbance  Continue Aricept.  Supportive care.  Essential hypertension. Patient received IV fluid for marginally low blood pressure.  Continue to hold antihypertensives.  Lactic acidosis Improved after fluid bolus.  Latest lactic acid of 1.1 from 2.4  DVT prophylaxis: heparin injection 5,000 Units Start: 06/01/20 0030  Code Status: Full code  Family Communication: I again spoke with the patient's daughter Ms Tracy Armstrong on the phone and updated her about the clinical condition of the patient.  Discussed about CODE STATUS and the patient is full code.  Status is: Inpatient  Remains inpatient appropriate because:Unsafe d/c plan, IV treatments appropriate due to intensity of illness or inability to take PO and Inpatient level of care appropriate due to severity of illness, surgical intervention 06/03/2020    Dispo: The patient is from: Home hospice              Anticipated d/c is to: Home hospice              Anticipated d/c date is: 3 days              Patient currently is not medically stable to d/c.  Consultants:  Orthopedics Dr. [name]  Procedures:  None so far  Antibiotics:  . Vancomycin and cefepime  Anti-infectives (From admission, onward)   Start     Dose/Rate  Route Frequency Ordered Stop   06/01/20 1200  vancomycin (VANCOREADY) IVPB 500 mg/100 mL     Discontinue     500 mg 100 mL/hr over 60 Minutes Intravenous Every 12 hours 05/31/20 2304     06/01/20 1030  ceFEPIme (MAXIPIME) 2 g in sodium chloride 0.9 % 100 mL IVPB     Discontinue     2 g 200 mL/hr over 30 Minutes Intravenous Every 12 hours 05/31/20 2304     05/31/20 2215  vancomycin (VANCOREADY) IVPB  1250 mg/250 mL        1,250 mg 166.7 mL/hr over 90 Minutes Intravenous  Once 05/31/20 2200 06/01/20 0047   05/31/20 2200  ceFEPIme (MAXIPIME) 2 g in sodium chloride 0.9 % 100 mL IVPB        2 g 200 mL/hr over 30 Minutes Intravenous  Once 05/31/20 2151 05/31/20 2313   05/31/20 2200  metroNIDAZOLE (FLAGYL) IVPB 500 mg        500 mg 100 mL/hr over 60 Minutes Intravenous  Once 05/31/20 2151 05/31/20 2335   05/31/20 2200  vancomycin (VANCOCIN) IVPB 1000 mg/200 mL premix  Status:  Discontinued        1,000 mg 200 mL/hr over 60 Minutes Intravenous  Once 05/31/20 2151 05/31/20 2200     Subjective: Today, patient was seen and examined at bedside. Patient denies pain, fever, chills or rigor.   Objective: Vitals:   06/02/20 0319 06/02/20 0719  BP: 101/70 (!) 108/63  Pulse: 88 102  Resp: 20 18  Temp: (!) 97.5 F (36.4 C) 97.7 F (36.5 C)  SpO2: 100% 100%    Intake/Output Summary (Last 24 hours) at 06/02/2020 0757 Last data filed at 06/02/2020 0600 Gross per 24 hour  Intake 1986.43 ml  Output 0 ml  Net 1986.43 ml   Filed Weights   05/31/20 2300  Weight: 63 kg   Body mass index is 22.42 kg/m.   Physical Exam: GENERAL: Patient is alert awake and communicative. Not in obvious distress.  Thinly built. HENT: Mild pallor noted.  Pupils equally reactive to light. Oral mucosa is moist NECK: is supple, no gross swelling noted. CHEST: Clear to auscultation. No crackles or wheezes.  Diminished breath sounds bilaterally. CVS: S1 and S2 heard, no murmur. Regular rate and rhythm.  ABDOMEN: Soft, non-tender, bowel sounds are present. EXTREMITIES: Gangrene of the left foot covered with dressing..  Right foot with deep stage IV decubitus ulceration, covered with dressing.  Cool to palpation of the left foot. CNS: Cranial nerves are intact.  Chronic bedbound status. SKIN: Decubitus ulceration present on admission.  Left foot ischemic gangrene.  Data Review: I have personally reviewed the  following laboratory data and studies,  CBC: Recent Labs  Lab 05/31/20 1633 06/01/20 0514 06/02/20 0051  WBC 15.2* 12.5* 9.8  NEUTROABS 12.3*  --   --   HGB 10.5* 9.3* 8.2*  HCT 37.7 31.9* 28.0*  MCV 90.2 90.4 88.1  PLT 179 128* 122*   Basic Metabolic Panel: Recent Labs  Lab 05/31/20 1633 06/01/20 0514 06/02/20 0051  NA 139 139 141  K 4.2 3.9 3.5  CL 108 108 114*  CO2 23 23 20*  GLUCOSE 94 105* 73  BUN 10 13 13   CREATININE 0.54 0.51 0.50  CALCIUM 8.6* 8.1* 7.8*  MG  --  1.7 1.6*  PHOS  --   --  3.3   Liver Function Tests: Recent Labs  Lab 05/31/20 1633 06/02/20 0051  AST 14* 14*  ALT  10 11  ALKPHOS 107 90  BILITOT 0.8 0.6  PROT 7.4 5.6*  ALBUMIN 1.8* 1.3*   No results for input(s): LIPASE, AMYLASE in the last 168 hours. No results for input(s): AMMONIA in the last 168 hours. Cardiac Enzymes: No results for input(s): CKTOTAL, CKMB, CKMBINDEX, TROPONINI in the last 168 hours. BNP (last 3 results) Recent Labs    05/15/20 0750 05/16/20 0659 05/17/20 0435  BNP 149.8* 225.9* 99.0    ProBNP (last 3 results) No results for input(s): PROBNP in the last 8760 hours.  CBG: No results for input(s): GLUCAP in the last 168 hours. Recent Results (from the past 240 hour(s))  SARS Coronavirus 2 by RT PCR (hospital order, performed in [facility] hospital lab) Nasopharyngeal Nasopharyngeal Swab     Status: None   Collection Time: 05/31/20 11:16 PM   Specimen: Nasopharyngeal Swab  Result Value Ref Range Status   SARS Coronavirus 2 NEGATIVE NEGATIVE Final    Comment: (NOTE) SARS-CoV-2 target nucleic acids are NOT DETECTED.  The SARS-CoV-2 RNA is generally detectable in upper and lower respiratory specimens during the acute phase of infection. The lowest concentration of SARS-CoV-2 viral copies this assay can detect is 250 copies / mL. A negative result does not preclude SARS-CoV-2 infection and should not be used as the sole basis for treatment or other patient  management decisions.  A negative result may occur with improper specimen collection / handling, submission of specimen other than nasopharyngeal swab, presence of viral mutation(s) within the areas targeted by this assay, and inadequate number of viral copies (<250 copies / mL). A negative result must be combined with clinical observations, patient history, and epidemiological information.  Fact Sheet for Patients:   BoilerBrush.com.cy  Fact Sheet for Healthcare Providers: https://pope.com/  This test is not yet approved or  cleared by the [place] FDA and has been authorized for detection and/or diagnosis of SARS-CoV-2 by FDA under an Emergency Use Authorization (EUA).  This EUA will remain in effect (meaning this test can be used) for the duration of the COVID-19 declaration under Section 564(b)(1) of the Act, 21 U.S.C. section 360bbb-3(b)(1), unless the authorization is terminated or revoked sooner.  Performed at [facility], [address] [place], [place] [address]      Studies: DG Chest Port 1 View  Result Date: 05/31/2020 CLINICAL DATA:  Sepsis EXAM: PORTABLE CHEST 1 VIEW COMPARISON:  05/13/2020 FINDINGS: No focal opacity or pleural effusion. Normal cardiomediastinal silhouette. No pneumothorax. IMPRESSION: No active disease. Electronically Signed   By: Jasmine Pang M.D.   On: 05/31/2020 22:44      Joycelyn Das, MD  [facility] 06/02/2020

## 2020-06-02 NOTE — Consult Note (Signed)
 ORTHOPAEDIC CONSULTATION  REQUESTING PHYSICIAN: Pokhrel, Laxman, MD  Chief Complaint: Worsening gangrenous ulcers of both feet  HPI: Tracy Armstrong is a 74 y.o. female who presents with worsening gangrenous ulcers both feet.  Patient's daughter has called me and stated that while they initially elected to proceed with conservative therapy and hospice at home her daughter states they cannot care for her foot wounds that are getting worse and she would like to proceed with bilateral above-the-knee amputations.  Past Medical History:  Diagnosis Date  . Multiple sclerosis (HCC) 2009   Past Surgical History:  Procedure Laterality Date  . CHOLECYSTECTOMY     Social History   Socioeconomic History  . Marital status: Widowed    Spouse name: Not on file  . Number of children: Not on file  . Years of education: Not on file  . Highest education level: Not on file  Occupational History  . Not on file  Tobacco Use  . Smoking status: Never Smoker  . Smokeless tobacco: Never Used  Substance and Sexual Activity  . Alcohol use: No  . Drug use: No  . Sexual activity: Not on file  Other Topics Concern  . Not on file  Social History Narrative  . Not on file   Social Determinants of Health   Financial Resource Strain:   . Difficulty of Paying Living Expenses:   Food Insecurity:   . Worried About Running Out of Food in the Last Year:   . Ran Out of Food in the Last Year:   Transportation Needs:   . Lack of Transportation (Medical):   . Lack of Transportation (Non-Medical):   Physical Activity:   . Days of Exercise per Week:   . Minutes of Exercise per Session:   Stress:   . Feeling of Stress :   Social Connections:   . Frequency of Communication with Friends and Family:   . Frequency of Social Gatherings with Friends and Family:   . Attends Religious Services:   . Active Member of Clubs or Organizations:   . Attends Club or Organization Meetings:   . Marital Status:    No  family history on file. - negative except otherwise stated in the family history section No Known Allergies Prior to Admission medications   Medication Sig Start Date End Date Taking? Authorizing Provider  mirtazapine (REMERON) 7.5 MG tablet Take 7.5 mg by mouth at bedtime. 05/02/20  Yes [provider]  morphine 20 MG/5ML solution Take 0.6 mLs (2.4 mg total) by mouth every 4 (four) hours as needed for pain (or dyspnea). Hospice patient 05/18/20  Yes Elgergawy, Dawood S, MD  ondansetron (ZOFRAN) 4 MG tablet Take 4 mg by mouth every 6 (six) hours as needed for nausea/vomiting. 05/19/20  Yes [provider]  pantoprazole (PROTONIX) 40 MG tablet Take 1 tablet (40 mg total) by mouth daily. 05/19/20  Yes Elgergawy, Dawood S, MD   DG Chest Port 1 View  Result Date: 05/31/2020 CLINICAL DATA:  Sepsis EXAM: PORTABLE CHEST 1 VIEW COMPARISON:  05/13/2020 FINDINGS: No focal opacity or pleural effusion. Normal cardiomediastinal silhouette. No pneumothorax. IMPRESSION: No active disease. Electronically Signed   By: Kim  Fujinaga M.D.   On: 05/31/2020 22:44   - pertinent xrays, CT, MRI studies were reviewed and independently interpreted  Positive ROS: All other systems have been reviewed and were otherwise negative with the exception of those mentioned in the HPI and as above.  Physical Exam: General: Alert, no acute distress Psychiatric:   Patient is competent for consent with normal mood and affect Lymphatic: No axillary or cervical lymphadenopathy Cardiovascular: No pedal edema Respiratory: No cyanosis, no use of accessory musculature GI: No organomegaly, abdomen is soft and non-tender    Images:  @ENCIMAGES @  Labs:  Lab Results  Component Value Date   CRP 18.8 (H) 05/17/2020   CRP 19.1 (H) 05/16/2020   CRP 18.2 (H) 05/15/2020   REPTSTATUS 05/16/2020 FINAL 05/13/2020   REPTSTATUS 05/18/2020 FINAL 05/13/2020   CULT (A) 05/13/2020    STAPHYLOCOCCUS SPECIES (COAGULASE  NEGATIVE) THE SIGNIFICANCE OF ISOLATING THIS ORGANISM FROM A SINGLE SET OF BLOOD CULTURES WHEN MULTIPLE SETS ARE DRAWN IS UNCERTAIN. PLEASE NOTIFY THE MICROBIOLOGY DEPARTMENT WITHIN ONE WEEK IF SPECIATION AND SENSITIVITIES ARE REQUIRED. Performed at [facility], [address] [place], [place]    CULT  05/13/2020    NO GROWTH 5 DAYS Performed at [facility], [address] [place], [place]    [address] PNEUMONIAE 07/19/2011    Lab Results  Component Value Date   ALBUMIN 1.3 (L) 06/02/2020   ALBUMIN 1.8 (L) 05/31/2020   ALBUMIN 1.3 (L) 05/17/2020    Neurologic: Patient does not have protective sensation bilateral lower extremities.   MUSCULOSKELETAL:   Skin: Examination patient has ischemic necrotic ulcers of both feet.  Patient has significant lower extremity contractures both legs are contracted and abducted her hips have a fixed flexion contracture with her hips flexed and a knee flexion contracture with the heels against her buttocks.  Patient has severe protein caloric malnutrition with an albumin of 1.3.  Assessment: Assessment: Gangrenous changes both feet with hip and knee flexion contractures  Plan: Plan: Patient's daughter wishes to proceed with bilateral above-the-knee amputations.  We will plan for surgery on Friday.  We will discuss this again with the patient's daughter today who will need to reinforce the treatment recommendations with the patient.  Thank you for the consult and the opportunity to see Ms. Sunday, MD [facility] [phone] 8:43 AM

## 2020-06-02 NOTE — Telephone Encounter (Signed)
Thank you. Will inform Dr [name].

## 2020-06-02 NOTE — Progress Notes (Signed)
Chaplain responded to page requesting a notary for AD.  Chaplain met with daughter explaining the difficulty  Chaplain was having rousing her mother.  Chaplain met with patient's nurse in the room who listened as Chaplain heard patient say she wanted her daughter to "speak for her."  Chaplain and nurse agreed patient understood what she would be signing.  Though patient was very weak, she did make her mark before two volunteer witnesses.  Chaplain notarized the AD.  Chaplain then ministered briefly to the daughter who has been her mother's caretaker for many years.  Daughter reported since her father died two years ago she has been her mother's sole support, even to the point of having to drop out of college to care for her after her last recent hospitalization.  Daughter was tearful. Chaplain applauded daughter's efforts and commended her for the above-and-beyond care she has given her mother.    Chaplain available for additional support of mother or daughter as needed.  De Burrs Chaplain Resident

## 2020-06-02 NOTE — Telephone Encounter (Signed)
Patient daughter called in to notify patient has a POA . Which is her daughter . Said to proceed with surgery for tomorrow.   [employer]

## 2020-06-03 ENCOUNTER — Inpatient Hospital Stay (HOSPITAL_COMMUNITY): Payer: Medicare Other | Admitting: Anesthesiology

## 2020-06-03 ENCOUNTER — Encounter (HOSPITAL_COMMUNITY)
Admission: EM | Disposition: A | Discharge status: Skilled Nursing Facility | Payer: Self-pay | Source: Home / Self Care | Attending: Internal Medicine

## 2020-06-03 ENCOUNTER — Encounter (HOSPITAL_COMMUNITY): Payer: Self-pay | Admitting: Family Medicine

## 2020-06-03 DIAGNOSIS — I96 Gangrene, not elsewhere classified: Secondary | ICD-10-CM | POA: Diagnosis not present

## 2020-06-03 HISTORY — PX: AMPUTATION: SHX166

## 2020-06-03 LAB — CBC
HCT: 27.2 % — ABNORMAL LOW (ref 36.0–46.0)
Hemoglobin: 8 g/dL — ABNORMAL LOW (ref 12.0–15.0)
MCH: 26.1 pg (ref 26.0–34.0)
MCHC: 29.4 g/dL — ABNORMAL LOW (ref 30.0–36.0)
MCV: 88.9 fL (ref 80.0–100.0)
Platelets: 136 10*3/uL — ABNORMAL LOW (ref 150–400)
RBC: 3.06 MIL/uL — ABNORMAL LOW (ref 3.87–5.11)
RDW: 16.8 % — ABNORMAL HIGH (ref 11.5–15.5)
WBC: 7.6 10*3/uL (ref 4.0–10.5)
nRBC: 0 % (ref 0.0–0.2)

## 2020-06-03 LAB — BASIC METABOLIC PANEL
Anion gap: 11 (ref 5–15)
BUN: 9 mg/dL (ref 8–23)
CO2: 15 mmol/L — ABNORMAL LOW (ref 22–32)
Calcium: 7.7 mg/dL — ABNORMAL LOW (ref 8.9–10.3)
Chloride: 120 mmol/L — ABNORMAL HIGH (ref 98–111)
Creatinine, Ser: 0.53 mg/dL (ref 0.44–1.00)
GFR calc Af Amer: >60 mL/min (ref >60)
GFR calc non Af Amer: >60 mL/min (ref >60)
Glucose, Bld: 50 mg/dL — ABNORMAL LOW (ref 70–99)
Potassium: 2.9 mmol/L — ABNORMAL LOW (ref 3.5–5.1)
Sodium: 146 mmol/L — ABNORMAL HIGH (ref 135–145)

## 2020-06-03 LAB — MAGNESIUM: Magnesium: 2.1 mg/dL (ref 1.7–2.4)

## 2020-06-03 SURGERY — AMPUTATION, ABOVE KNEE
Anesthesia: General | Site: Knee | Laterality: Bilateral

## 2020-06-03 MED ORDER — DOCUSATE SODIUM 100 MG PO CAPS
100.0000 mg | ORAL_CAPSULE | Freq: Two times a day (BID) | ORAL | Status: DC
  Administered 2020-06-03 – 2020-06-14 (×19): 100 mg via ORAL
  Filled 2020-06-03 (×21): qty 1

## 2020-06-03 MED ORDER — CEFAZOLIN SODIUM 1 G IJ SOLR
INTRAMUSCULAR | Status: AC
  Filled 2020-06-03: qty 10

## 2020-06-03 MED ORDER — FENTANYL CITRATE (PF) 250 MCG/5ML IJ SOLN
INTRAMUSCULAR | Status: AC
  Filled 2020-06-03: qty 5

## 2020-06-03 MED ORDER — OXYCODONE HCL 5 MG PO TABS
5.0000 mg | ORAL_TABLET | ORAL | Status: DC | PRN
  Administered 2020-06-04 – 2020-06-13 (×3): 5 mg via ORAL
  Filled 2020-06-03 (×4): qty 1

## 2020-06-03 MED ORDER — CEFAZOLIN SODIUM-DEXTROSE 2-4 GM/100ML-% IV SOLN
2.0000 g | INTRAVENOUS | Status: AC
  Administered 2020-06-03: 1 g via INTRAVENOUS

## 2020-06-03 MED ORDER — PROPOFOL 10 MG/ML IV BOLUS
INTRAVENOUS | Status: AC
  Filled 2020-06-03: qty 20

## 2020-06-03 MED ORDER — LIDOCAINE 2% (20 MG/ML) 5 ML SYRINGE
INTRAMUSCULAR | Status: AC
  Filled 2020-06-03: qty 5

## 2020-06-03 MED ORDER — CEFAZOLIN SODIUM 1 G IJ SOLR
INTRAMUSCULAR | Status: AC
  Filled 2020-06-03: qty 20

## 2020-06-03 MED ORDER — PHENYLEPHRINE HCL-NACL 10-0.9 MG/250ML-% IV SOLN
INTRAVENOUS | Status: DC | PRN
Start: 2020-06-03 — End: 2020-06-03
  Administered 2020-06-03: 120 ug/min via INTRAVENOUS

## 2020-06-03 MED ORDER — EPHEDRINE 5 MG/ML INJ
INTRAVENOUS | Status: AC
  Filled 2020-06-03: qty 10

## 2020-06-03 MED ORDER — ONDANSETRON HCL 4 MG/2ML IJ SOLN: 4.0000 mg | Freq: Once | INTRAMUSCULAR | Status: DC | PRN

## 2020-06-03 MED ORDER — LIDOCAINE HCL (CARDIAC) PF 100 MG/5ML IV SOSY
PREFILLED_SYRINGE | INTRAVENOUS | Status: DC | PRN
  Administered 2020-06-03: 40 mg via INTRAVENOUS

## 2020-06-03 MED ORDER — OXYCODONE HCL 5 MG PO TABS: 5.0000 mg | ORAL_TABLET | Freq: Once | ORAL | Status: DC | PRN

## 2020-06-03 MED ORDER — OXYCODONE HCL 5 MG/5ML PO SOLN: 5.0000 mg | Freq: Once | ORAL | Status: DC | PRN

## 2020-06-03 MED ORDER — FENTANYL CITRATE (PF) 100 MCG/2ML IJ SOLN: 25.0000 ug | INTRAMUSCULAR | Status: DC | PRN

## 2020-06-03 MED ORDER — HYDROMORPHONE HCL 1 MG/ML IJ SOLN
0.5000 mg | INTRAMUSCULAR | Status: DC | PRN
  Administered 2020-06-05 – 2020-06-11 (×2): 0.5 mg via INTRAVENOUS
  Filled 2020-06-03 (×2): qty 1

## 2020-06-03 MED ORDER — ONDANSETRON HCL 4 MG/2ML IJ SOLN
INTRAMUSCULAR | Status: DC | PRN
  Administered 2020-06-03: 4 mg via INTRAVENOUS

## 2020-06-03 MED ORDER — POTASSIUM CHLORIDE 2 MEQ/ML IV SOLN
INTRAVENOUS | Status: DC
  Filled 2020-06-03 (×4): qty 1000

## 2020-06-03 MED ORDER — ONDANSETRON HCL 4 MG/2ML IJ SOLN
INTRAMUSCULAR | Status: AC
  Filled 2020-06-03: qty 2

## 2020-06-03 MED ORDER — PROPOFOL 10 MG/ML IV BOLUS
INTRAVENOUS | Status: DC | PRN
  Administered 2020-06-03: 80 mg via INTRAVENOUS

## 2020-06-03 MED ORDER — EPHEDRINE SULFATE 50 MG/ML IJ SOLN
INTRAMUSCULAR | Status: DC | PRN
  Administered 2020-06-03 (×2): 5 mg via INTRAVENOUS

## 2020-06-03 MED ORDER — MIDAZOLAM HCL 2 MG/2ML IJ SOLN
INTRAMUSCULAR | Status: AC
  Filled 2020-06-03: qty 2

## 2020-06-03 MED ORDER — PHENYLEPHRINE 40 MCG/ML (10ML) SYRINGE FOR IV PUSH (FOR BLOOD PRESSURE SUPPORT)
PREFILLED_SYRINGE | INTRAVENOUS | Status: AC
  Filled 2020-06-03: qty 10

## 2020-06-03 MED ORDER — DEXAMETHASONE SODIUM PHOSPHATE 10 MG/ML IJ SOLN
INTRAMUSCULAR | Status: DC | PRN
  Administered 2020-06-03: 5 mg via INTRAVENOUS

## 2020-06-03 MED ORDER — KCL-LACTATED RINGERS 20 MEQ/L IV SOLN
INTRAVENOUS | Status: DC
  Filled 2020-06-03: qty 1000

## 2020-06-03 MED ORDER — PHENYLEPHRINE 40 MCG/ML (10ML) SYRINGE FOR IV PUSH (FOR BLOOD PRESSURE SUPPORT)
PREFILLED_SYRINGE | INTRAVENOUS | Status: DC | PRN
  Administered 2020-06-03 (×5): 80 ug via INTRAVENOUS

## 2020-06-03 MED ORDER — POTASSIUM CHLORIDE CRYS ER 20 MEQ PO TBCR
40.0000 meq | EXTENDED_RELEASE_TABLET | Freq: Two times a day (BID) | ORAL | Status: AC
  Administered 2020-06-03 (×2): 40 meq via ORAL
  Filled 2020-06-03 (×2): qty 2

## 2020-06-03 MED ORDER — LACTATED RINGERS IV SOLN: INTRAVENOUS | Status: DC | PRN

## 2020-06-03 MED ORDER — OXYCODONE HCL 5 MG PO TABS: 10.0000 mg | ORAL_TABLET | ORAL | Status: DC | PRN

## 2020-06-03 MED ORDER — DEXAMETHASONE SODIUM PHOSPHATE 10 MG/ML IJ SOLN
INTRAMUSCULAR | Status: AC
  Filled 2020-06-03: qty 1

## 2020-06-03 MED ORDER — SODIUM CHLORIDE 0.9 % IV SOLN: INTRAVENOUS | Status: DC

## 2020-06-03 SURGICAL SUPPLY — 41 items
BLADE SAW RECIP 87.9 MT (BLADE) ×2 IMPLANT
BLADE SURG 21 STRL SS (BLADE) ×2 IMPLANT
BNDG COHESIVE 6X5 TAN STRL LF (GAUZE/BANDAGES/DRESSINGS) ×2 IMPLANT
CANISTER WOUND CARE 500ML ATS (WOUND CARE) IMPLANT
COVER SURGICAL LIGHT HANDLE (MISCELLANEOUS) ×2 IMPLANT
COVER WAND RF STERILE (DRAPES) IMPLANT
CUFF TOURN SGL QUICK 34 (TOURNIQUET CUFF)
CUFF TRNQT CYL 34X4.125X (TOURNIQUET CUFF) IMPLANT
DRAPE INCISE IOBAN 66X45 STRL (DRAPES) ×4 IMPLANT
DRAPE U-SHAPE 47X51 STRL (DRAPES) ×2 IMPLANT
DRESSING PREVENA PLUS CUSTOM (GAUZE/BANDAGES/DRESSINGS) ×1 IMPLANT
DRSG PREVENA PLUS CUSTOM (GAUZE/BANDAGES/DRESSINGS) ×2
DURAPREP 26ML APPLICATOR (WOUND CARE) ×2 IMPLANT
ELECT REM PT RETURN 9FT ADLT (ELECTROSURGICAL) ×2
ELECTRODE REM PT RTRN 9FT ADLT (ELECTROSURGICAL) ×1 IMPLANT
GLOVE BIOGEL PI IND STRL 7.5 (GLOVE) ×1 IMPLANT
GLOVE BIOGEL PI IND STRL 9 (GLOVE) ×1 IMPLANT
GLOVE BIOGEL PI INDICATOR 7.5 (GLOVE) ×2
GLOVE BIOGEL PI INDICATOR 9 (GLOVE) ×2
GLOVE SURG ORTHO 9.0 STRL STRW (GLOVE) ×3 IMPLANT
GLOVE SURG SS PI 6.5 STRL IVOR (GLOVE) ×3 IMPLANT
GOWN STRL REUS W/ TWL LRG LVL3 (GOWN DISPOSABLE) ×1 IMPLANT
GOWN STRL REUS W/ TWL XL LVL3 (GOWN DISPOSABLE) ×2 IMPLANT
GOWN STRL REUS W/TWL LRG LVL3 (GOWN DISPOSABLE) ×6
GOWN STRL REUS W/TWL XL LVL3 (GOWN DISPOSABLE) ×4
KIT BASIN OR (CUSTOM PROCEDURE TRAY) ×2 IMPLANT
KIT TURNOVER KIT B (KITS) ×2 IMPLANT
MANIFOLD NEPTUNE II (INSTRUMENTS) ×2 IMPLANT
NS IRRIG 1000ML POUR BTL (IV SOLUTION) ×2 IMPLANT
PACK ORTHO EXTREMITY (CUSTOM PROCEDURE TRAY) ×3 IMPLANT
PAD ARMBOARD 7.5X6 YLW CONV (MISCELLANEOUS) ×2 IMPLANT
PREVENA RESTOR ARTHOFORM 46X30 (CANNISTER) ×2 IMPLANT
STAPLER VISISTAT 35W (STAPLE) IMPLANT
STOCKINETTE IMPERVIOUS LG (DRAPES) ×2 IMPLANT
SUT ETHILON 2 0 PSLX (SUTURE) ×4 IMPLANT
SUT SILK 2 0 (SUTURE) ×2
SUT SILK 2-0 18XBRD TIE 12 (SUTURE) ×1 IMPLANT
TOWEL GREEN STERILE FF (TOWEL DISPOSABLE) ×2 IMPLANT
TUBE CONNECTING 20X1/4 (TUBING) ×2 IMPLANT
TUBING BULK SUCTION (MISCELLANEOUS) ×2 IMPLANT
YANKAUER SUCT BULB TIP NO VENT (SUCTIONS) ×3 IMPLANT

## 2020-06-03 NOTE — Op Note (Signed)
06/03/2020  8:51 AM  PATIENT:  Tracy Armstrong    PRE-OPERATIVE DIAGNOSIS:  Gangrene and osteomyelitis bilateral legs  POST-OPERATIVE DIAGNOSIS:  Same  PROCEDURE:  BILATERAL ABOVE KNEE AMPUTATIONS   SURGEON:  Nadara Mustard, MD  PHYSICIAN ASSISTANT:None ANESTHESIA:   General  PREOPERATIVE INDICATIONS:  [name] is a  74 y.o. female with a diagnosis of osteomyelitis bilateral legs who failed conservative measures and elected for surgical management.    The risks benefits and alternatives were discussed with the patient preoperatively including but not limited to the risks of infection, bleeding, nerve injury, cardiopulmonary complications, the need for revision surgery, among others, and the patient was willing to proceed.  OPERATIVE IMPLANTS: Praveena customizable and Arthur form wound VAC x2  @ENCIMAGES @  OPERATIVE FINDINGS: Peripheral vascular disease with minimal petechial bleeding at the above-the-knee amputation site  OPERATIVE PROCEDURE: Patient was brought the operating room and underwent a general anesthetic.  After adequate levels anesthesia were obtained patient's left lower extremity was prepped using DuraPrep draped into a sterile field a timeout was called.  Due to the abduction contracture of the hip as well as hip flexion contracture and knee flexion contractures elected to proceed with one extremity at a time.  A fishmouth incision was made through the mid femur on the left this was carried down to the bone this was carried down through the intermuscular septum medially the vascular bundle was clamped the amputation was completed the bone was amputated with a reciprocating saw.  2-0 silk was used to tie the vascular bundle.  Hemostasis was obtained.  The deep and superficial fascia layer and skin was closed using 2-0 nylon and staples and a Mepilex dressing was applied.  The drapes were then removed and the right lower extremity was then prepped using DuraPrep draped  into a sterile field and the left lower extremity was draped out of the sterile field.  A fishmouth incision was made through the mid femur this was carried down to bone the intermuscular septum was opened and the vascular bundle medially was clamped and suture ligated with 2-0 silk the amputation was completed reciprocating saw was used to resect the femur.  Hemostasis was obtained there was minimal petechial bleeding in both legs.  The deep and superficial fascia layer and skin was closed using 2-0 nylon.  The incision was then closed with the customizable wound VAC dressing this was then covered with the form dressing this was covered with Covan and this had a good suction fit with attention was then focused on the left lower extremity that Mepilex dressing was removed the incision was then closed with the customizable dressing Arthur form wound VAC sponge covered with Coban both of these worse hooked to separate wound VAC canisters these had good suction fit patient was extubated taken to PACU in stable condition   DISCHARGE PLANNING:  Antibiotic duration: Continue antibiotics for 24 hours  Weightbearing: Not applicable  Pain medication: Opioid pathway  Dressing care/ Wound VAC: Continue wound vacs for 1 week  Ambulatory devices: Not applicable  Discharge to: Anticipate discharge back to skilled nursing  Follow-up: In the office 1 week post operative.

## 2020-06-03 NOTE — Anesthesia Postprocedure Evaluation (Signed)
Anesthesia Post Note  Patient: Tracy Armstrong  Procedure(s) Performed: BILATERAL ABOVE KNEE AMPUTATIONS (Bilateral Knee)     Patient location during evaluation: PACU Anesthesia Type: General Level of consciousness: awake and patient cooperative (at baseline) Pain management: pain level controlled Vital Signs Assessment: post-procedure vital signs reviewed and stable Respiratory status: spontaneous breathing, nonlabored ventilation and respiratory function stable Cardiovascular status: blood pressure returned to baseline and stable Postop Assessment: no apparent nausea or vomiting Anesthetic complications: no   No complications documented.  Last Vitals:  Vitals:   06/03/20 0905 06/03/20 0952  BP: (!) 91/61 (!) 87/58  Pulse: 91 93  Resp: 16 16  Temp: 36.5 C 36.6 C  SpO2: 100% 99%    Last Pain:  Vitals:   06/03/20 0554  TempSrc: Oral  PainSc:                  Beryle Lathe

## 2020-06-03 NOTE — Transfer of Care (Signed)
Immediate Anesthesia Transfer of Care Note  Patient: Salli P Shapley  Procedure(s) Performed: BILATERAL ABOVE KNEE AMPUTATIONS (Bilateral Knee)  Patient Location: PACU  Anesthesia Type:General  Level of Consciousness: awake  Airway & Oxygen Therapy: Patient Spontanous Breathing and Patient connected to face mask oxygen  Post-op Assessment: Report given to RN and Post -op Vital signs reviewed and stable  Post vital signs: Reviewed and stable  Last Vitals:  Vitals Value Taken Time  BP 94/65 06/03/20 0847  Temp    Pulse 89 06/03/20 0850  Resp 12 06/03/20 0850  SpO2 100 % 06/03/20 0850  Vitals shown include unvalidated device data.  Last Pain:  Vitals:   06/03/20 0554  TempSrc: Oral  PainSc:          Complications: No complications documented.

## 2020-06-03 NOTE — Anesthesia Preprocedure Evaluation (Addendum)
Anesthesia Evaluation  Patient identified by MRN, date of birth, ID band Patient awake    Reviewed: Allergy & Precautions, NPO status , Patient's Chart, lab work & pertinent test results  History of Anesthesia Complications Negative for: history of anesthetic complications  Airway Mallampati: III   Neck ROM: Limited    Dental  (+) Edentulous Upper, Edentulous Lower   Pulmonary neg pulmonary ROS,    Pulmonary exam normal        Cardiovascular hypertension, Normal cardiovascular exam     Neuro/Psych PSYCHIATRIC DISORDERS Dementia  Neuromuscular disease (Multiple sclerosis)    GI/Hepatic Neg liver ROS, GERD  Medicated,  Endo/Other  negative endocrine ROS  Renal/GU negative Renal ROS     Musculoskeletal negative musculoskeletal ROS (+)   Abdominal   Peds  Hematology  (+) anemia ,  Thrombocytopenia    Anesthesia Other Findings Covid test negative   Reproductive/Obstetrics                            Anesthesia Physical Anesthesia Plan  ASA: IV  Anesthesia Plan: General   Post-op Pain Management:    Induction: Intravenous  PONV Risk Score and Plan: 3 and Treatment may vary due to age or medical condition, Ondansetron and Dexamethasone  Airway Management Planned: LMA  Additional Equipment: None  Intra-op Plan:   Post-operative Plan: Extubation in OR  Informed Consent: I have reviewed the patients History and Physical, chart, labs and discussed the procedure including the risks, benefits and alternatives for the proposed anesthesia with the patient or authorized representative who has indicated his/her understanding and acceptance.     Dental advisory given  Plan Discussed with: CRNA and Anesthesiologist  Anesthesia Plan Comments:        Anesthesia Quick Evaluation

## 2020-06-03 NOTE — Interval H&P Note (Signed)
History and Physical Interval Note:  06/03/2020 7:00 AM  Tracy Armstrong  has presented today for surgery, with the diagnosis of osteomyelitis bilateral legs.  The various methods of treatment have been discussed with the patient and family. After consideration of risks, benefits and other options for treatment, the patient has consented to  Procedure(s): BILATERAL ABOVE KNEE AMPUTATIONS (Bilateral) as a surgical intervention.  The patient's history has been reviewed, patient examined, no change in status, stable for surgery.  I have reviewed the patient's chart and labs.  Questions were answered to the patient's satisfaction.     Nadara Mustard

## 2020-06-03 NOTE — Evaluation (Signed)
Physical Therapy Evaluation Patient Details Name: Tracy Armstrong MRN: [id number] DOB: 05-12-1946 Today's Date: 06/03/2020   History of Present Illness  Pt is a 74 y/o female s/p bilateral transfemoral amputations secondary to worsening gangrenous ulcers of both feet. PMH including but not limited to MS.    Clinical Impression  Pt presented supine in bed with HOB elevated, awake and willing to participate in therapy session. No family/caregivers present to provide any reliable information regarding PLOF and home environment. Per chart review from previous admission, pt lives with her daughters who act as her caregivers 24/7. She required total A for all mobility and ADLs. At the time of evaluation, pt was very limited overall secondary to post-op pain, weakness and baseline contractures and limitations. Pt remains at her baseline in regards to functional mobility at this time and therefore does not have any acute PT needs at this time. PT signing off.     Follow Up Recommendations Supervision/Assistance - 24 hour;No PT follow up;Other (comment) (Home versus SNF/long-term care facility - depending on if family is still comfortable providing total care for her)    Equipment Recommendations  Other (comment) (HOYER LIFT)    Recommendations for Other Services       Precautions / Restrictions Precautions Precautions: Other (comment) Precaution Comments: bilateral AKA's with wound VACs Restrictions Weight Bearing Restrictions: Yes RLE Weight Bearing: Non weight bearing LLE Weight Bearing: Non weight bearing      Mobility  Bed Mobility Overal bed mobility: Needs Assistance Bed Mobility: Rolling;Supine to Sit;Sit to Supine Rolling: Total assist;+2 for physical assistance   Supine to sit: Total assist;+2 for physical assistance Sit to supine: Total assist;+2 for physical assistance   General bed mobility comments: Total assist needed for all aspects  Transfers                     Ambulation/Gait                Stairs            Wheelchair Mobility    Modified Rankin (Stroke Patients Only)       Balance Overall balance assessment: Needs assistance Sitting-balance support: No upper extremity supported Sitting balance-Leahy Scale: Zero Sitting balance - Comments: total A needed                                     Pertinent Vitals/Pain Pain Assessment: Faces Faces Pain Scale: Hurts even more Pain Location: bilateral residual limbs with mobility Pain Descriptors / Indicators: Grimacing Pain Intervention(s): Monitored during session;Repositioned    Home Living Family/patient expects to be discharged to:: Private residence Living Arrangements: Children Available Help at Discharge: Family;Available 24 hours/day           Home Equipment: Wheelchair - manual;Hospital bed      Prior Function Level of Independence: Needs assistance   Gait / Transfers Assistance Needed: total assist for bed mobility, transfer to WC  ADL's / Homemaking Assistance Needed: used to self feed, total care for bathing, dressing, toilets in her depends.         Hand Dominance        Extremity/Trunk Assessment   Upper Extremity Assessment Upper Extremity Assessment: RUE deficits/detail;LUE deficits/detail;Difficult to assess due to impaired cognition RUE Deficits / Details: flexion contractures throughout with pt preferring to maintain UE in shoulder IR/adduction and elbow flexion LUE Deficits / Details: flexion contractures  throughout with pt preferring to maintain UE in shoulder IR/adduction and elbow flexion    Lower Extremity Assessment Lower Extremity Assessment: RLE deficits/detail;LLE deficits/detail RLE Deficits / Details: wound VAC in place, pt now s/p transfemoral amputation with no active movement of residual limb noted throughout LLE Deficits / Details: wound VAC in place, pt now s/p transfemoral amputation with no active  movement of residual limb noted throughout       Communication   Communication: Expressive difficulties  Cognition Arousal/Alertness: Awake/alert Behavior During Therapy: WFL for tasks assessed/performed Overall Cognitive Status: History of cognitive impairments - at baseline                                 General Comments: Baseline of dementia.  Patient able to answer simple questions and follow simple commands but no family/caregivers present to confirm accuracy of information provided      General Comments      Exercises     Assessment/Plan    PT Assessment Patent does not need any further PT services  PT Problem List Decreased strength;Decreased range of motion;Decreased activity tolerance;Decreased mobility;Decreased balance;Decreased coordination;Decreased cognition;Decreased knowledge of use of DME;Decreased safety awareness;Decreased knowledge of precautions;Pain       PT Treatment Interventions      PT Goals (Current goals can be found in the Care Plan section)  Acute Rehab PT Goals Patient Stated Goal: did not state PT Goal Formulation: All assessment and education complete, DC therapy    Frequency     Barriers to discharge        Co-evaluation               AM-PAC PT "6 Clicks" Mobility  Outcome Measure Help needed turning from your back to your side while in a flat bed without using bedrails?: Total Help needed moving from lying on your back to sitting on the side of a flat bed without using bedrails?: Total Help needed moving to and from a bed to a chair (including a wheelchair)?: Total Help needed standing up from a chair using your arms (e.g., wheelchair or bedside chair)?: Total Help needed to walk in hospital room?: Total Help needed climbing 3-5 steps with a railing? : Total 6 Click Score: 6    End of Session   Activity Tolerance: Patient limited by pain;Patient limited by fatigue Patient left: in bed;with call bell/phone  within reach Nurse Communication: Mobility status;Need for lift equipment PT Visit Diagnosis: Pain;Muscle weakness (generalized) (M62.81) Pain - part of body:  (residual limbs)    Time: 2409-7353 PT Time Calculation (min) (ACUTE ONLY): 17 min   Charges:   PT Evaluation $PT Eval Moderate Complexity: 1 Mod          Ginette Pitman, PT, DPT  Acute Rehabilitation Services Pager [phone] Office [phone]   Alessandra Bevels Daneen Volcy 06/03/2020, 3:25 PM

## 2020-06-03 NOTE — Anesthesia Procedure Notes (Signed)
Procedure Name: LMA Insertion Date/Time: 06/03/2020 7:43 AM Performed by: Shary Decamp, CRNA Pre-anesthesia Checklist: Patient identified, Emergency Drugs available, Suction available and Patient being monitored Patient Re-evaluated:Patient Re-evaluated prior to induction Oxygen Delivery Method: Circle system utilized Preoxygenation: Pre-oxygenation with 100% oxygen Induction Type: IV induction Ventilation: Mask ventilation without difficulty LMA Size: 4.0 Number of attempts: 1 Airway Equipment and Method: Oral airway Placement Confirmation: positive ETCO2 and breath sounds checked- equal and bilateral Tube secured with: Tape Dental Injury: Teeth and Oropharynx as per pre-operative assessment

## 2020-06-03 NOTE — Progress Notes (Signed)
PROGRESS NOTE  Tracy Armstrong OMB:[id number] DOB: 10-23-1946 DOA: 05/31/2020 PCP: [name], MD   LOS: 3 days   Brief narrative: As per HPI,  This is a 74 year old female with past medical history of multiple sclerosis, dementia, hypertension and chronic bedbound status who was recently  discharged on 7/14 with a diagnosis of coag negative staph bacteremia from left lower extremity cellulitis with osteomyelitis that is chronic as well as chronic decubiti right lower extremity.  She was discharged home with home hospice and oral antibiotics.    During the previous admission, there was discussion about  BKA however after discussion with palliative this was not done given the patient many comorbidities.  She was discharged with treatment and goals directed towards comfort.  Patient presented this time with worsening lower extremity decubital/ulcer.  Per daughter, hospice stated that they have done everything they can.  Daughter was advised to bring the patient back to the ER.  Prior to coming to the ER the patient spoke with Dr. [name], orthopedics.  In the ED, patient was given broad-spectrum antibiotics and was then admitted to hospital for further evaluation and treatment.  Assessment/Plan:  Principal Problem:   Gangrene (HCC) Active Problems:   Sepsis (HCC)   Unstageable pressure injury of skin and tissue (HCC)   Dementia without behavioral disturbance (HCC)   HTN (hypertension)   Lactic acidosis   Palliative care by specialist   Cellulitis of left foot  Left lower extremity gangrene/cellulitis.  Status post bilateral above-knee amputation on 06/03/2020  on vancomycin and cefepime.  Trending down leukocytosis.  We will continue antibiotic for 24 hours and discontinue.  Currently on wound VAC which will need to be continued for 1 week.  She will have to follow-up in the office in 1 week postoperatively.  WBC 7.6.  History of multiple sclerosis bedbound status and severe limb  contractures.  Continue supportive care. Very advanced disease.  Currently on hospice care at home.    Pressure injury of skin and tissue  -Multiple pressure ulcers over the sacral/ buttocks area and lower extremities.  Present on admission.  Wound care on board.   Dementia without behavioral disturbance  Continue Aricept.  Supportive care.   Essential hypertension.   Continue to hold antihypertensives.  Blood pressure on the lower side after surgery but mentating well.  We will closely monitor.    Lactic acidosis Improved after fluid bolus.  Latest lactic acid of 1.1 from 2.4  Hypokalemia. Potassium of 2.9 will replenish IV.  Check BMP in a.m.  Will replenish orally and in  IV.  Magnesium 2.1.   DVT prophylaxis: heparin injection 5,000 Units Start: 06/01/20 0030  Code Status: Full code  Family Communication: I  spoke with the patient's daughter at bedside regarding the clinical condition of the patient.   Status is: Inpatient  Remains inpatient appropriate because:Unsafe d/c plan, IV treatments appropriate due to intensity of illness or inability to take PO and Inpatient level of care appropriate due to severity of illness, status post bilateral above-knee amputation  06/03/2020, IV antibiotics    Dispo: The patient is from:  Home hospice              Anticipated d/c is to:  Home hospice              Anticipated d/c date is: 1-2 datys              Patient currently is not medically stable to d/c.  Consultants:  Orthopedics Dr. [name]  Procedures:  Bilateral above-knee amputation on 06/03/2020  Antibiotics:  . Vancomycin and cefepime  Anti-infectives (From admission, onward)    Start     Dose/Rate Route Frequency Ordered Stop   06/03/20 0600  ceFAZolin (ANCEF) IVPB 2g/100 mL premix        2 g 200 mL/hr over 30 Minutes Intravenous To Short Stay 06/03/20 0042 06/03/20 0745   06/01/20 1200  [MAR Hold]  vancomycin (VANCOREADY) IVPB 500 mg/100 mL     Discontinue     (MAR Hold  since Fri 06/03/2020 at 0622.Hold Reason: Transfer to a Procedural area.)   500 mg 100 mL/hr over 60 Minutes Intravenous Every 12 hours 05/31/20 2304     06/01/20 1030  [MAR Hold]  ceFEPIme (MAXIPIME) 2 g in sodium chloride 0.9 % 100 mL IVPB     Discontinue     (MAR Hold since Fri 06/03/2020 at 0622.Hold Reason: Transfer to a Procedural area.)   2 g 200 mL/hr over 30 Minutes Intravenous Every 12 hours 05/31/20 2304     05/31/20 2215  vancomycin (VANCOREADY) IVPB 1250 mg/250 mL        1,250 mg 166.7 mL/hr over 90 Minutes Intravenous  Once 05/31/20 2200 06/01/20 0047   05/31/20 2200  ceFEPIme (MAXIPIME) 2 g in sodium chloride 0.9 % 100 mL IVPB        2 g 200 mL/hr over 30 Minutes Intravenous  Once 05/31/20 2151 05/31/20 2313   05/31/20 2200  metroNIDAZOLE (FLAGYL) IVPB 500 mg        500 mg 100 mL/hr over 60 Minutes Intravenous  Once 05/31/20 2151 05/31/20 2335   05/31/20 2200  vancomycin (VANCOCIN) IVPB 1000 mg/200 mL premix  Status:  Discontinued        1,000 mg 200 mL/hr over 60 Minutes Intravenous  Once 05/31/20 2151 05/31/20 2200      Subjective: Today, patient was seen and examined at the bedside after amputation.  Denies overt pain.  Denies any shortness of breath, cough, fever or chills.   Objective: Vitals:   06/02/20 2155 06/03/20 0554  BP: 119/76 (!) 101/54  Pulse: 94 95  Resp: 18 16  Temp: 98.2 F (36.8 C) 98.1 F (36.7 C)  SpO2: 100% 100%    Intake/Output Summary (Last 24 hours) at 06/03/2020 0817 Last data filed at 06/03/2020 0745 Gross per 24 hour  Intake 2444.62 ml  Output 0 ml  Net 2444.62 ml   Filed Weights   05/31/20 2300  Weight: 63 kg   Body mass index is 22.42 kg/m.   Physical Exam: GENERAL: Patient is alert awake and communicative. Not in obvious distress.  Thinly built. HENT: Mild pallor noted.  Pupils equally reactive to light. Oral mucosa is moist NECK: is supple, no gross swelling noted. CHEST: Clear to auscultation. No crackles or wheezes.   Diminished breath sounds bilaterally. CVS: S1 and S2 heard, no murmur. Regular rate and rhythm.  ABDOMEN: Soft, non-tender, bowel sounds are present. EXTREMITIES: Bilateral above knee amputation with wound VACs.  Contractures of the upper extremities.   CNS: Cranial nerves are intact.  Chronic bedbound status. SKIN: Decubitus ulceration present on admission.  Bilateral above-knee amputation.  Data Review: I have personally reviewed the following laboratory data and studies,  CBC: Recent Labs  Lab 05/31/20 1633 06/01/20 0514 06/02/20 0051 06/03/20 0607  WBC 15.2* 12.5* 9.8 7.6  NEUTROABS 12.3*  --   --   --   HGB 10.5* 9.3* 8.2* 8.0*  HCT 37.7 31.9* 28.0* 27.2*  MCV 90.2 90.4 88.1 88.9  PLT 179 128* 122* 136*   Basic Metabolic Panel: Recent Labs  Lab 05/31/20 1633 06/01/20 0514 06/02/20 0051 06/03/20 0607  NA 139 139 141 146*  K 4.2 3.9 3.5 2.9*  CL 108 108 114* 120*  CO2 23 23 20* 15*  GLUCOSE 94 105* 73 50*  BUN 10 13 13 9   CREATININE 0.54 0.51 0.50 0.53  CALCIUM 8.6* 8.1* 7.8* 7.7*  MG  --  1.7 1.6* 2.1  PHOS  --   --  3.3  --    Liver Function Tests: Recent Labs  Lab 05/31/20 1633 06/02/20 0051  AST 14* 14*  ALT 10 11  ALKPHOS 107 90  BILITOT 0.8 0.6  PROT 7.4 5.6*  ALBUMIN 1.8* 1.3*   No results for input(s): LIPASE, AMYLASE in the last 168 hours. No results for input(s): AMMONIA in the last 168 hours. Cardiac Enzymes: No results for input(s): CKTOTAL, CKMB, CKMBINDEX, TROPONINI in the last 168 hours. BNP (last 3 results) Recent Labs    05/15/20 0750 05/16/20 0659 05/17/20 0435  BNP 149.8* 225.9* 99.0    ProBNP (last 3 results) No results for input(s): PROBNP in the last 8760 hours.  CBG: No results for input(s): GLUCAP in the last 168 hours. Recent Results (from the past 240 hour(s))  SARS Coronavirus 2 by RT PCR (hospital order, performed in [facility] hospital lab) Nasopharyngeal Nasopharyngeal Swab     Status: None   Collection Time:  05/31/20 11:16 PM   Specimen: Nasopharyngeal Swab  Result Value Ref Range Status   SARS Coronavirus 2 NEGATIVE NEGATIVE Final    Comment: (NOTE) SARS-CoV-2 target nucleic acids are NOT DETECTED.  The SARS-CoV-2 RNA is generally detectable in upper and lower respiratory specimens during the acute phase of infection. The lowest concentration of SARS-CoV-2 viral copies this assay can detect is 250 copies / mL. A negative result does not preclude SARS-CoV-2 infection and should not be used as the sole basis for treatment or other patient management decisions.  A negative result may occur with improper specimen collection / handling, submission of specimen other than nasopharyngeal swab, presence of viral mutation(s) within the areas targeted by this assay, and inadequate number of viral copies (<250 copies / mL). A negative result must be combined with clinical observations, patient history, and epidemiological information.  Fact Sheet for Patients:   06/02/20  Fact Sheet for Healthcare Providers: BoilerBrush.com.cy  This test is not yet approved or  cleared by the https://pope.com/ FDA and has been authorized for detection and/or diagnosis of SARS-CoV-2 by FDA under an Emergency Use Authorization (EUA).  This EUA will remain in effect (meaning this test can be used) for the duration of the COVID-19 declaration under Section 564(b)(1) of the Act, 21 U.S.C. section 360bbb-3(b)(1), unless the authorization is terminated or revoked sooner.  Performed at [facility], [address], [place], [place]      Studies: No results found.    [id number], MD  [facility] 06/03/2020

## 2020-06-03 NOTE — Progress Notes (Signed)
Pharmacy Antibiotic Note  Tracy Armstrong is a 74 y.o. female admitted on 05/31/2020 with sepsis.  Pharmacy has been consulted for Cefepime and Vancomycin dosing.  Patient was previously discharged on 7/14 with Keflex and doxycycline for coag negative staph bacteremia from LLE chronic osteomyelitis and chronic decubiti RLE.  Patient presented with worsening lower extremity decubital/ulcer, and decided for surgical management.  Dr. [name] performed bilateral AKA on 7/30.    Height: 5\' 6"  (167.6 cm) Weight: 63 kg (138 lb 14.2 oz) IBW/kg (Calculated) : 59.3  Temp (24hrs), Avg:98.1 F (36.7 C), Min:97.7 F (36.5 C), Max:98.4 F (36.9 C)  Recent Labs  Lab 05/31/20 1633 06/01/20 0356 06/01/20 0514 06/02/20 0051 06/03/20 0607  WBC 15.2*  --  12.5* 9.8 7.6  CREATININE 0.54  --  0.51 0.50 0.53  LATICACIDVEN 2.4* 1.1  --   --   --     Estimated Creatinine Clearance: 59.5 mL/min (by C-G formula based on SCr of 0.53 mg/dL).    No Known Allergies  Antimicrobials this admission: 7/27 Cefepime >>  7/27 Vancomycin >>   Dose adjustments this admission: N/a  Microbiology results: N/a   Plan:  -Continue antibiotics for 24 hours post surgery -Cefepime 2g IV q12h  -Vancomycin 500mg  IV q12h (nomogram dosing)  -Monitor patients renal function and urine output  -Patient will follow up outpatient in 1 week  Pharmacy to sign off  Thank you for allowing pharmacy to be a part of this patients care.  8/27, PharmD PGY-1 Acute Care Pharmacy Resident Office: [phone] 06/03/2020 1:43 PM

## 2020-06-04 ENCOUNTER — Encounter (HOSPITAL_COMMUNITY): Payer: Self-pay | Admitting: Orthopedic Surgery

## 2020-06-04 LAB — CBC
HCT: 25.5 % — ABNORMAL LOW (ref 36.0–46.0)
Hemoglobin: 7.3 g/dL — ABNORMAL LOW (ref 12.0–15.0)
MCH: 25.4 pg — ABNORMAL LOW (ref 26.0–34.0)
MCHC: 28.6 g/dL — ABNORMAL LOW (ref 30.0–36.0)
MCV: 88.9 fL (ref 80.0–100.0)
Platelets: 132 10*3/uL — ABNORMAL LOW (ref 150–400)
RBC: 2.87 MIL/uL — ABNORMAL LOW (ref 3.87–5.11)
RDW: 17.2 % — ABNORMAL HIGH (ref 11.5–15.5)
WBC: 9.7 10*3/uL (ref 4.0–10.5)
nRBC: 0 % (ref 0.0–0.2)

## 2020-06-04 LAB — MAGNESIUM: Magnesium: 2 mg/dL (ref 1.7–2.4)

## 2020-06-04 LAB — PHOSPHORUS: Phosphorus: 2.8 mg/dL (ref 2.5–4.6)

## 2020-06-04 LAB — COMPREHENSIVE METABOLIC PANEL
ALT: 10 U/L (ref 0–44)
AST: 13 U/L — ABNORMAL LOW (ref 15–41)
Albumin: 1.3 g/dL — ABNORMAL LOW (ref 3.5–5.0)
Alkaline Phosphatase: 82 U/L (ref 38–126)
Anion gap: 9 (ref 5–15)
BUN: 11 mg/dL (ref 8–23)
CO2: 17 mmol/L — ABNORMAL LOW (ref 22–32)
Calcium: 7.9 mg/dL — ABNORMAL LOW (ref 8.9–10.3)
Chloride: 120 mmol/L — ABNORMAL HIGH (ref 98–111)
Creatinine, Ser: 0.6 mg/dL (ref 0.44–1.00)
GFR calc Af Amer: >60 mL/min (ref >60)
GFR calc non Af Amer: >60 mL/min (ref >60)
Glucose, Bld: 83 mg/dL (ref 70–99)
Potassium: 4.3 mmol/L (ref 3.5–5.1)
Sodium: 146 mmol/L — ABNORMAL HIGH (ref 135–145)
Total Bilirubin: 0.4 mg/dL (ref 0.3–1.2)
Total Protein: 5.4 g/dL — ABNORMAL LOW (ref 6.5–8.1)

## 2020-06-04 MED ORDER — POTASSIUM CHLORIDE CRYS ER 20 MEQ PO TBCR
40.0000 meq | EXTENDED_RELEASE_TABLET | Freq: Once | ORAL | Status: AC
  Administered 2020-06-04: 40 meq via ORAL
  Filled 2020-06-04: qty 2

## 2020-06-04 NOTE — Plan of Care (Signed)

## 2020-06-04 NOTE — Progress Notes (Signed)
Patient ID: Tracy Armstrong, female   DOB: 1946-01-22, 74 y.o.   MRN: [id number] Patient is alert this morning sitting up in bed she states she is comfortable.  No drainage in either wound VAC canister.  Patient may discharge to home if they are comfortable with continued around-the-clock care.  Patient will discharge with the portable Praveena wound VAC pumps.

## 2020-06-04 NOTE — Evaluation (Signed)
Occupational Therapy Evaluation Patient Details Name: Tracy Armstrong MRN: [id number] DOB: 1946-02-23 Today's Date: 06/04/2020    History of Present Illness Pt is a 74 y/o female s/p bilateral transfemoral amputations secondary to worsening gangrenous ulcers of both feet. PMH including but not limited to MS.   Clinical Impression   Pt admitted s/p bil transfemoral amputations. At baseline, pt is total care and spends majority of time in bed and family total A transfers to w/c to get out for appointments. At time of eval, pt remains total A +2 for bed mobility. UEs are contracted at baseline, not appropriate for splinting due to tone/skin integrity risks. Pt grimaces in pain with any PROM or mobility. Given dependent baseline, pt is not longer appropriate for OT services and needs continued 24/7 custodial care. OT will sign off, thank you for this consult.     Follow Up Recommendations  Supervision/Assistance - 24 hour    Equipment Recommendations  None recommended by OT    Recommendations for Other Services       Precautions / Restrictions Precautions Precautions: Other (comment) Precaution Comments: bilateral AKA's with wound VACs Restrictions Weight Bearing Restrictions: Yes RLE Weight Bearing: Non weight bearing LLE Weight Bearing: Non weight bearing      Mobility Bed Mobility Overal bed mobility: Needs Assistance Bed Mobility: Rolling;Supine to Sit;Sit to Supine Rolling: Total assist;+2 for physical assistance   Supine to sit: Total assist;+2 for physical assistance Sit to supine: Total assist;+2 for physical assistance   General bed mobility comments: Total assist needed for all aspects  Transfers                      Balance Overall balance assessment: Needs assistance Sitting-balance support: No upper extremity supported Sitting balance-Leahy Scale: Zero                                     ADL either performed or assessed with clinical  judgement   ADL                                         General ADL Comments: Pt can engage in some grooming and self feeding with max A and elbow support. otherwise she is total A for all BADLs. Total A is her baseline     Vision Patient Visual Report: No change from baseline       Perception     Praxis      Pertinent Vitals/Pain Pain Assessment: Faces Faces Pain Scale: Hurts even more Pain Location: bilateral residual limbs with mobility; BUEs with ranging Pain Descriptors / Indicators: Grimacing Pain Intervention(s): Monitored during session;Repositioned     Hand Dominance     Extremity/Trunk Assessment Upper Extremity Assessment Upper Extremity Assessment: RUE deficits/detail;LUE deficits/detail RUE Deficits / Details: flexion contractures in shoulder, elbow. In position of shoulder IR/adduction with elbow flexion. Wrists are contracted- fingers are in partial extension LUE Deficits / Details: more mobile than RUE. Attempts to initiate self feeding with LUE. Not able to functionally grasp. Wrist contracted, elbow partially contracted and shoulder in IR   Lower Extremity Assessment Lower Extremity Assessment: RLE deficits/detail;LLE deficits/detail RLE Deficits / Details: wound VAC in place, pt now s/p transfemoral amputation with no active movement of residual limb noted throughout LLE Deficits / Details: wound VAC  in place, pt now s/p transfemoral amputation with no active movement of residual limb noted throughout       Communication Communication Communication: Expressive difficulties   Cognition Arousal/Alertness: Awake/alert Behavior During Therapy: WFL for tasks assessed/performed Overall Cognitive Status: History of cognitive impairments - at baseline                                 General Comments: Baseline dementia. Per chart review pt is verbal with simple commands. Pt was nonverbal with OT this session   General Comments        Exercises General Exercises - Upper Extremity Shoulder Flexion: PROM;5 reps;Both;Supine Elbow Extension: PROM;Both;5 reps Digit Composite Flexion: PROM;Both;5 reps   Shoulder Instructions      Home Living Family/patient expects to be discharged to:: Private residence Living Arrangements: Children Available Help at Discharge: Family;Available 24 hours/day Type of Home: House Home Access: Ramped entrance     Home Layout: One level               Home Equipment: Wheelchair - manual;Hospital bed   Additional Comments: all information taken from previous admission due to pt inability to report      Prior Functioning/Environment Level of Independence: Needs assistance  Gait / Transfers Assistance Needed: total assist for bed mobility, transfer to WC ADL's / Homemaking Assistance Needed: used to self feed- cannot now, total care for bathing, dressing, toilets in her depends.            OT Problem List: Decreased strength;Decreased range of motion;Impaired balance (sitting and/or standing);Decreased coordination;Decreased cognition;Impaired UE functional use;Impaired tone      OT Treatment/Interventions: Self-care/ADL training;Therapeutic exercise;Therapeutic activities;Patient/family education;DME and/or AE instruction    OT Goals(Current goals can be found in the care plan section) Acute Rehab OT Goals OT Goal Formulation: Patient unable to participate in goal setting Time For Goal Achievement: 06/18/20 Potential to Achieve Goals: Fair  OT Frequency: Min 2X/week   Barriers to D/C:            Co-evaluation              AM-PAC OT "6 Clicks" Daily Activity     Outcome Measure Help from another person eating meals?: A Lot Help from another person taking care of personal grooming?: A Lot Help from another person toileting, which includes using toliet, bedpan, or urinal?: Total Help from another person bathing (including washing, rinsing, drying)?:  Total Help from another person to put on and taking off regular upper body clothing?: Total Help from another person to put on and taking off regular lower body clothing?: Total 6 Click Score: 8   End of Session Nurse Communication: Mobility status  Activity Tolerance: Patient tolerated treatment well Patient left: in bed;with call bell/phone within reach;with bed alarm set  OT Visit Diagnosis: Other abnormalities of gait and mobility (R26.89);Muscle weakness (generalized) (M62.81);Other symptoms and signs involving cognitive function                Time: 8185-6314 OT Time Calculation (min): 10 min Charges:  OT General Charges $OT Visit: 1 Visit OT Evaluation $OT Eval Moderate Complexity: 1 Mod  Dalphine Handing, MSOT, OTR/L Acute Rehabilitation Services [facility] Office Number: [phone] Pager: [phone]  Dalphine Handing 06/04/2020, 12:37 PM

## 2020-06-04 NOTE — Progress Notes (Addendum)
PROGRESS NOTE  Tracy Armstrong SHALL XLK:[id number] DOB: 28-Oct-1946 DOA: 05/31/2020 PCP: [name], MD   LOS: 4 days   Brief narrative: As per HPI,  This is a 74 year old female with past medical history of multiple sclerosis, dementia, hypertension and chronic bedbound status who was recently  discharged on 7/14 with a diagnosis of coag negative staph bacteremia from left lower extremity cellulitis with osteomyelitis that is chronic as well as chronic decubiti right lower extremity.  She was discharged home with home hospice and oral antibiotics.    During the previous admission, there was discussion about  BKA however after discussion with palliative this was not done given the patient many comorbidities.  She was discharged with treatment and goals directed towards comfort.  Patient presented this time with worsening lower extremity decubital/ulcer.  Per daughter, hospice stated that they have done everything they can.  Daughter was advised to bring the patient back to the ER.  Prior to coming to the ER the patient spoke with Dr. [name], orthopedics.  In the ED, patient was given broad-spectrum antibiotics and was then admitted to hospital for further evaluation and treatment.  Assessment/Plan:  Principal Problem:   Gangrene (HCC) Active Problems:   Sepsis (HCC)   Unstageable pressure injury of skin and tissue (HCC)   Dementia without behavioral disturbance (HCC)   HTN (hypertension)   Lactic acidosis   Palliative care by specialist   Cellulitis of left foot  Left lower extremity gangrene/cellulitis.  Status post bilateral above-knee amputation on 06/03/2020. Received vancomycin and cefepime. Off antibiotics now.  Trending down leukocytosis and has normalized.    Currently on wound VAC which will need to be continued for 1 week, follow-up with Dr. [name] office in 1 week postoperatively.    History of multiple sclerosis bedbound status and severe limb contractures.  Continue supportive care.  Very advanced disease.  Currently on hospice care at home.    Pressure injury of skin and tissue  -Multiple pressure ulcers over the sacral/ buttocks area and lower extremities.  Present on admission.  Wound care on board.   Dementia without behavioral disturbance  Continue Aricept.  Supportive care.   Essential hypertension.   Continue to hold antihypertensives.  Blood pressure on the lower side.  We will closely monitor.    Lactic acidosis Improved after fluid bolus.  Latest lactic acid of 1.1 from 2.4  Hypokalemia.  Improved with IV potassium supplements.  Potassium 4.3 today.  Add oral potassium. Dc iv fluid with potassium  Anemia likely secondary to postoperative acute blood loss.  Hemoglobin 7.3 today.  Monitor CBC in a.m.  Transfuse for hemoglobin less than 7.   DVT prophylaxis: heparin injection 5,000 Units Start: 06/01/20 0030  Code Status: Full code  Family Communication:  I  spoke with the patient's daughter Ms Tracy Armstrong on the phone and updated her about the clinical condition of the patient. She wishes SNF on discharge.  Status is: Inpatient  Remains inpatient appropriate because:Unsafe d/c plan, IV treatments appropriate due to intensity of illness or inability to take PO and Inpatient level of care appropriate due to severity of illness, status post bilateral above-knee amputation  06/03/2020, skilled nursing facility placement    Dispo: The patient is from:  Home hospice              Anticipated d/c is to:  Home hospice/SNF as per PT              Anticipated d/c date is:  1-2 days              Patient currently is not medically stable to d/c.  Consultants:  Orthopedics Dr. [name]  Procedures:  Bilateral above-knee amputation on 06/03/2020  Antibiotics:  None today Subjective:  Today, patient was seen and examined at bedside.  Patient denies any pain, nausea, vomiting, shortness of breath.     Objective: Vitals:   06/03/20 2055 06/04/20 0511  BP: 98/68  (!) 102/64  Pulse: 99 104  Resp: 15 14  Temp: (!) 97.4 F (36.3 C) (!) 97.3 F (36.3 C)  SpO2: 100% 90%    Intake/Output Summary (Last 24 hours) at 06/04/2020 0851 Last data filed at 06/04/2020 0827 Gross per 24 hour  Intake 1686.37 ml  Output 0 ml  Net 1686.37 ml   Filed Weights   05/31/20 2300 06/04/20 0511  Weight: 63 kg 76.7 kg   Body mass index is 27.28 kg/m.   Physical Exam: ??GENERAL: Patient is alert awake and communicative. Not in obvious distress.  Thinly built. HENT: Mild pallor noted.  Pupils equally reactive to light. Oral mucosa is moist NECK: is supple, no gross swelling noted. CHEST: Clear to auscultation. No crackles or wheezes.  Diminished breath sounds bilaterally. CVS: S1 and S2 heard, no murmur. Regular rate and rhythm.  ABDOMEN: Soft, non-tender, bowel sounds are present. EXTREMITIES: Bilateral above knee amputation with wound VACs.  Contractures of the upper extremities.   CNS: Cranial nerves are intact.  Chronic bedbound status. SKIN: Decubitus ulceration present on admission.  Bilateral above-knee amputation.  Data Review: I have personally reviewed the following laboratory data and studies,  CBC: Recent Labs  Lab 05/31/20 1633 06/01/20 0514 06/02/20 0051 06/03/20 0607 06/04/20 0427  WBC 15.2* 12.5* 9.8 7.6 9.7  NEUTROABS 12.3*  --   --   --   --   HGB 10.5* 9.3* 8.2* 8.0* 7.3*  HCT 37.7 31.9* 28.0* 27.2* 25.5*  MCV 90.2 90.4 88.1 88.9 88.9  PLT 179 128* 122* 136* 132*   Basic Metabolic Panel: Recent Labs  Lab 05/31/20 1633 06/01/20 0514 06/02/20 0051 06/03/20 0607 06/04/20 0427  NA 139 139 141 146* 146*  K 4.2 3.9 3.5 2.9* 4.3  CL 108 108 114* 120* 120*  CO2 23 23 20* 15* 17*  GLUCOSE 94 105* 73 50* 83  BUN 10 13 13 9 11   CREATININE 0.54 0.51 0.50 0.53 0.60  CALCIUM 8.6* 8.1* 7.8* 7.7* 7.9*  MG  --  1.7 1.6* 2.1 2.0  PHOS  --   --  3.3  --  2.8   Liver Function Tests: Recent Labs  Lab 05/31/20 1633 06/02/20 0051  06/04/20 0427  AST 14* 14* 13*  ALT 10 11 10   ALKPHOS 107 90 82  BILITOT 0.8 0.6 0.4  PROT 7.4 5.6* 5.4*  ALBUMIN 1.8* 1.3* 1.3*   No results for input(s): LIPASE, AMYLASE in the last 168 hours. No results for input(s): AMMONIA in the last 168 hours. Cardiac Enzymes: No results for input(s): CKTOTAL, CKMB, CKMBINDEX, TROPONINI in the last 168 hours. BNP (last 3 results) Recent Labs    05/15/20 0750 05/16/20 0659 05/17/20 0435  BNP 149.8* 225.9* 99.0    ProBNP (last 3 results) No results for input(s): PROBNP in the last 8760 hours.  CBG: No results for input(s): GLUCAP in the last 168 hours. Recent Results (from the past 240 hour(s))  SARS Coronavirus 2 by RT PCR (hospital order, performed in [facility] hospital lab) Nasopharyngeal Nasopharyngeal  Swab     Status: None   Collection Time: 05/31/20 11:16 PM   Specimen: Nasopharyngeal Swab  Result Value Ref Range Status   SARS Coronavirus 2 NEGATIVE NEGATIVE Final    Comment: (NOTE) SARS-CoV-2 target nucleic acids are NOT DETECTED.  The SARS-CoV-2 RNA is generally detectable in upper and lower respiratory specimens during the acute phase of infection. The lowest concentration of SARS-CoV-2 viral copies this assay can detect is 250 copies / mL. A negative result does not preclude SARS-CoV-2 infection and should not be used as the sole basis for treatment or other patient management decisions.  A negative result may occur with improper specimen collection / handling, submission of specimen other than nasopharyngeal swab, presence of viral mutation(s) within the areas targeted by this assay, and inadequate number of viral copies (<250 copies / mL). A negative result must be combined with clinical observations, patient history, and epidemiological information.  Fact Sheet for Patients:   BoilerBrush.com.cy  Fact Sheet for Healthcare Providers: https://pope.com/  This test is  not yet approved or  cleared by the [place] FDA and has been authorized for detection and/or diagnosis of SARS-CoV-2 by FDA under an Emergency Use Authorization (EUA).  This EUA will remain in effect (meaning this test can be used) for the duration of the COVID-19 declaration under Section 564(b)(1) of the Act, 21 U.S.C. section 360bbb-3(b)(1), unless the authorization is terminated or revoked sooner.  Performed at [facility], [address], [place], [place] [address]      Studies: No results found.    Joycelyn Das, MD  [facility] 06/04/2020

## 2020-06-05 LAB — BASIC METABOLIC PANEL
Anion gap: 7 (ref 5–15)
BUN: 11 mg/dL (ref 8–23)
CO2: 19 mmol/L — ABNORMAL LOW (ref 22–32)
Calcium: 7.8 mg/dL — ABNORMAL LOW (ref 8.9–10.3)
Chloride: 122 mmol/L — ABNORMAL HIGH (ref 98–111)
Creatinine, Ser: 0.45 mg/dL (ref 0.44–1.00)
GFR calc Af Amer: >60 mL/min (ref >60)
GFR calc non Af Amer: >60 mL/min (ref >60)
Glucose, Bld: 113 mg/dL — ABNORMAL HIGH (ref 70–99)
Potassium: 3.6 mmol/L (ref 3.5–5.1)
Sodium: 148 mmol/L — ABNORMAL HIGH (ref 135–145)

## 2020-06-05 LAB — CBC
HCT: 21.8 % — ABNORMAL LOW (ref 36.0–46.0)
Hemoglobin: 6.5 g/dL — CL (ref 12.0–15.0)
MCH: 25.4 pg — ABNORMAL LOW (ref 26.0–34.0)
MCHC: 29.8 g/dL — ABNORMAL LOW (ref 30.0–36.0)
MCV: 85.2 fL (ref 80.0–100.0)
Platelets: DECREASED 10*3/uL (ref 150–400)
RBC: 2.56 MIL/uL — ABNORMAL LOW (ref 3.87–5.11)
RDW: 17.5 % — ABNORMAL HIGH (ref 11.5–15.5)
WBC: 5.5 10*3/uL (ref 4.0–10.5)
nRBC: 0.4 % — ABNORMAL HIGH (ref 0.0–0.2)

## 2020-06-05 LAB — PREPARE RBC (CROSSMATCH)

## 2020-06-05 MED ORDER — MEGESTROL ACETATE 40 MG PO TABS: 40.0000 mg | ORAL_TABLET | Freq: Every day | ORAL | Status: DC

## 2020-06-05 MED ORDER — PANTOPRAZOLE SODIUM 40 MG PO TBEC
40.0000 mg | DELAYED_RELEASE_TABLET | Freq: Every day | ORAL | Status: DC
  Administered 2020-06-06 – 2020-06-14 (×9): 40 mg via ORAL
  Filled 2020-06-05 (×9): qty 1

## 2020-06-05 MED ORDER — ENSURE ENLIVE PO LIQD
237.0000 mL | Freq: Two times a day (BID) | ORAL | Status: DC
  Administered 2020-06-05 – 2020-06-14 (×12): 237 mL via ORAL

## 2020-06-05 MED ORDER — MEGESTROL ACETATE 400 MG/10ML PO SUSP: 400.0000 mg | Freq: Every day | ORAL | Status: DC

## 2020-06-05 MED ORDER — MIRTAZAPINE 15 MG PO TABS
7.5000 mg | ORAL_TABLET | Freq: Every day | ORAL | Status: DC
  Administered 2020-06-05 – 2020-06-13 (×7): 7.5 mg via ORAL
  Filled 2020-06-05 (×8): qty 1

## 2020-06-05 MED ORDER — SODIUM CHLORIDE 0.9% IV SOLUTION: Freq: Once | INTRAVENOUS | Status: DC

## 2020-06-05 NOTE — Progress Notes (Addendum)
PROGRESS NOTE  TRANG BOUSE QMG:[id number] DOB: October 22, 1946 DOA: 05/31/2020 PCP: [name], MD   LOS: 5 days   Brief narrative: As per HPI,  This is a 74 year old female with past medical history of multiple sclerosis, dementia, hypertension and chronic bedbound status who was recently  discharged on 7/14 with a diagnosis of coag negative staph bacteremia from left lower extremity cellulitis with osteomyelitis that is chronic as well as chronic decubiti right lower extremity.  She was discharged home with home hospice and oral antibiotics.    During the previous admission, there was discussion about  BKA however after discussion with palliative this was not done given the patient many comorbidities.  She was discharged with treatment and goals directed towards comfort.  Patient presented this time with worsening lower extremity decubital/ulcer.  Per daughter, hospice stated that they have done everything they can.  Daughter was advised to bring the patient back to the ER.  Prior to coming to the ER, the family spoke with Dr. [name], orthopedics.  In the ED, patient was given broad-spectrum antibiotics and was then admitted to hospital for further evaluation and treatment.  Assessment/Plan:  Principal Problem:   Gangrene (HCC) Active Problems:   Sepsis (HCC)   Unstageable pressure injury of skin and tissue (HCC)   Dementia without behavioral disturbance (HCC)   HTN (hypertension)   Lactic acidosis   Palliative care by specialist   Cellulitis of left foot  Left lower extremity gangrene/cellulitis.  Status post bilateral above-knee amputation on 06/03/2020. Received vancomycin and cefepime. Off antibiotics now.  Leukocytosis has normalized.    Currently on wound VAC which will need to be continued for 1 week, follow-up with Dr. [name] office in 1 week postoperatively.    History of multiple sclerosis bedbound status and severe limb contractures.  Continue supportive care. Very advanced  disease.  Currently on hospice care at home.    Pressure injury of skin and tissue  -Multiple pressure ulcers over the sacral/ buttocks area and lower extremities.  Present on admission.  Wound care on board.   Dementia without behavioral disturbance  Continue Aricept.  Supportive care.   Essential hypertension.   Continue to hold antihypertensives.     Lactic acidosis Improved after fluid bolus.  Latest lactic acid of 1.1 from 2.4  Hypokalemia.  Improved with IV potassium supplements.   Anemia likely secondary to postoperative acute blood loss.  Hemoglobin 6.5 today. Will transfuse I unit of PRBC.   Poor appetite.  Resume Remeron from home.  Nutritional supplements.  Patient likes Ensure.  We will add Ensure.   DVT prophylaxis: heparin injection 5,000 Units Start: 06/01/20 0030  Code Status: Full code   Family Communication:  I  spoke with the patient's daughter [name] on the phone and updated her about the clinical condition of the patient. She wishes SNF on discharge.  Status is: Inpatient  Remains inpatient appropriate because:Unsafe d/c plan, IV treatments appropriate due to intensity of illness or inability to take PO and Inpatient level of care appropriate due to severity of illness, status post bilateral above-knee amputation  06/03/2020, skilled nursing facility placement    Dispo: The patient is from:  Home hospice              Anticipated d/c is to: SNF as per PT. patient's daughter wishes skilled facility on discharge.              Anticipated d/c date is: 1-2 days  Patient currently is not medically stable to d/c.  Consultants:  Orthopedics Dr. [name]  Procedures:  Bilateral above-knee amputation on 06/03/2020  Antibiotics:  None today  Subjective:  Today, patient was seen and examined at bedside.  Patient denies any pain nausea vomiting but the nursing staff reported that that she was not eating much.  Spoke with patient daughter who states  that she does not eat much at home either.  She was on appetite stimulation medication in the past as well  Objective: Vitals:   06/04/20 2037 06/05/20 0415  BP: (!) 100/60 112/67  Pulse: 99 101  Resp:  14  Temp: (!) 97 F (36.1 C) 98.1 F (36.7 C)  SpO2: 100% 99%    Intake/Output Summary (Last 24 hours) at 06/05/2020 0900 Last data filed at 06/05/2020 0300 Gross per 24 hour  Intake 237 ml  Output 0 ml  Net 237 ml   Filed Weights   05/31/20 2300 06/04/20 0511  Weight: 63 kg 76.7 kg   Body mass index is 27.28 kg/m.   Physical Exam:??  GENERAL: Patient is alert awake and communicative. Not in obvious distress.  Thinly built. HENT: Mild pallor noted.  Pupils equally reactive to light. Oral mucosa is moist NECK: is supple, no gross swelling noted. CHEST: Clear to auscultation. No crackles or wheezes.  Diminished breath sounds bilaterally. CVS: S1 and S2 heard, no murmur. Regular rate and rhythm.  ABDOMEN: Soft, non-tender, bowel sounds are present. EXTREMITIES: Bilateral above knee amputation with wound VACs.  Contractures of the upper extremities.   CNS: Cranial nerves are intact.  Chronic bedbound status. SKIN: Decubitus ulceration present on admission.  Bilateral above-knee amputation.  Data Review: I have personally reviewed the following laboratory data and studies,  CBC: Recent Labs  Lab 05/31/20 1633 06/01/20 0514 06/02/20 0051 06/03/20 0607 06/04/20 0427  WBC 15.2* 12.5* 9.8 7.6 9.7  NEUTROABS 12.3*  --   --   --   --   HGB 10.5* 9.3* 8.2* 8.0* 7.3*  HCT 37.7 31.9* 28.0* 27.2* 25.5*  MCV 90.2 90.4 88.1 88.9 88.9  PLT 179 128* 122* 136* 132*   Basic Metabolic Panel: Recent Labs  Lab 05/31/20 1633 06/01/20 0514 06/02/20 0051 06/03/20 0607 06/04/20 0427  NA 139 139 141 146* 146*  K 4.2 3.9 3.5 2.9* 4.3  CL 108 108 114* 120* 120*  CO2 23 23 20* 15* 17*  GLUCOSE 94 105* 73 50* 83  BUN 10 13 13 9 11   CREATININE 0.54 0.51 0.50 0.53 0.60  CALCIUM 8.6*  8.1* 7.8* 7.7* 7.9*  MG  --  1.7 1.6* 2.1 2.0  PHOS  --   --  3.3  --  2.8   Liver Function Tests: Recent Labs  Lab 05/31/20 1633 06/02/20 0051 06/04/20 0427  AST 14* 14* 13*  ALT 10 11 10   ALKPHOS 107 90 82  BILITOT 0.8 0.6 0.4  PROT 7.4 5.6* 5.4*  ALBUMIN 1.8* 1.3* 1.3*   No results for input(s): LIPASE, AMYLASE in the last 168 hours. No results for input(s): AMMONIA in the last 168 hours. Cardiac Enzymes: No results for input(s): CKTOTAL, CKMB, CKMBINDEX, TROPONINI in the last 168 hours. BNP (last 3 results) Recent Labs    05/15/20 0750 05/16/20 0659 05/17/20 0435  BNP 149.8* 225.9* 99.0    ProBNP (last 3 results) No results for input(s): PROBNP in the last 8760 hours.  CBG: No results for input(s): GLUCAP in the last 168 hours. Recent Results (from the  past 240 hour(s))  SARS Coronavirus 2 by RT PCR (hospital order, performed in [facility] hospital lab) Nasopharyngeal Nasopharyngeal Swab     Status: None   Collection Time: 05/31/20 11:16 PM   Specimen: Nasopharyngeal Swab  Result Value Ref Range Status   SARS Coronavirus 2 NEGATIVE NEGATIVE Final    Comment: (NOTE) SARS-CoV-2 target nucleic acids are NOT DETECTED.  The SARS-CoV-2 RNA is generally detectable in upper and lower respiratory specimens during the acute phase of infection. The lowest concentration of SARS-CoV-2 viral copies this assay can detect is 250 copies / mL. A negative result does not preclude SARS-CoV-2 infection and should not be used as the sole basis for treatment or other patient management decisions.  A negative result may occur with improper specimen collection / handling, submission of specimen other than nasopharyngeal swab, presence of viral mutation(s) within the areas targeted by this assay, and inadequate number of viral copies (<250 copies / mL). A negative result must be combined with clinical observations, patient history, and epidemiological information.  Fact Sheet for  Patients:   BoilerBrush.com.cy  Fact Sheet for Healthcare Providers: https://pope.com/  This test is not yet approved or  cleared by the [place] FDA and has been authorized for detection and/or diagnosis of SARS-CoV-2 by FDA under an Emergency Use Authorization (EUA).  This EUA will remain in effect (meaning this test can be used) for the duration of the COVID-19 declaration under Section 564(b)(1) of the Act, 21 U.S.C. section 360bbb-3(b)(1), unless the authorization is terminated or revoked sooner.  Performed at [facility], [address] [place], [place] [address]      Studies: No results found.    Joycelyn Das, MD  [facility] 06/05/2020

## 2020-06-05 NOTE — Progress Notes (Signed)
     Subjective: 2 Days Post-Op Procedure(s) (LRB): BILATERAL ABOVE KNEE AMPUTATIONS (Bilateral) Bilateral AKAs for chronic decubitii, chronic decubitii to bone Patient reports pain as mild.    Objective:   VITALS:  Temp:  [97 F (36.1 C)-98.1 F (36.7 C)] 98 F (36.7 C) (08/01 0926) Pulse Rate:  [98-101] 98 (08/01 0926) Resp:  [14-16] 16 (08/01 0926) BP: (100-112)/(60-67) 110/61 (08/01 0926) SpO2:  [98 %-100 %] 98 % (08/01 0926)  Neurologically intact ABD soft Neurovascular intact Sensation intact distally Incision: no drainage and VAC both AKA residual stumps   LABS Recent Labs    06/03/20 0607 06/03/20 0607 06/04/20 0427 06/05/20 0946  HGB 8.0*  --  7.3* 6.5*  WBC 7.6   < > 9.7 5.5  PLT 136*   < > 132* PLATELET CLUMPS NOTED ON SMEAR, COUNT APPEARS DECREASED   < > = values in this interval not displayed.   Recent Labs    06/04/20 0427 06/05/20 0946  NA 146* 148*  K 4.3 3.6  CL 120* 122*  CO2 17* 19*  BUN 11 11  CREATININE 0.60 0.45  GLUCOSE 83 113*   No results for input(s): LABPT, INR in the last 72 hours.   Assessment/Plan: 2 Days Post-Op Procedure(s) (LRB): BILATERAL ABOVE KNEE AMPUTATIONS (Bilateral)  Advance diet Up with therapy Discharge to SNF  Vira Browns 06/05/2020, 2:56 PMPatient ID: Tracy Armstrong, female   DOB: [birth year], 74 y.o.   MRN: [id number]

## 2020-06-06 LAB — CBC
HCT: 27.7 % — ABNORMAL LOW (ref 36.0–46.0)
Hemoglobin: 8.7 g/dL — ABNORMAL LOW (ref 12.0–15.0)
MCH: 27.5 pg (ref 26.0–34.0)
MCHC: 31.4 g/dL (ref 30.0–36.0)
MCV: 87.7 fL (ref 80.0–100.0)
Platelets: 80 10*3/uL — ABNORMAL LOW (ref 150–400)
RBC: 3.16 MIL/uL — ABNORMAL LOW (ref 3.87–5.11)
RDW: 16.7 % — ABNORMAL HIGH (ref 11.5–15.5)
WBC: 4.8 10*3/uL (ref 4.0–10.5)
nRBC: 0 % (ref 0.0–0.2)

## 2020-06-06 LAB — SURGICAL PATHOLOGY

## 2020-06-06 NOTE — Progress Notes (Addendum)
PROGRESS NOTE  Tracy Armstrong GUY:[id number] DOB: May 23, 1946 DOA: 05/31/2020 PCP: [name], MD   LOS: 6 days   Brief narrative: As per HPI,  This is a 74 year old female with past medical history of multiple sclerosis, dementia, hypertension and chronic bedbound status who was recently  discharged on 7/14 with a diagnosis of coag negative staph bacteremia from left lower extremity cellulitis with osteomyelitis that is chronic as well as chronic decubiti right lower extremity.  She was discharged home with home hospice and oral antibiotics.    During the previous admission, there was discussion about  BKA however after discussion with palliative this was not done given the patient many comorbidities.  She was discharged with treatment and goals directed towards comfort.  Patient presented this time with worsening lower extremity decubital/ulcer.  Per daughter, hospice stated that they have done everything they can.  Daughter was advised to bring the patient back to the ER.  Prior to coming to the ER, the family spoke with Dr. [name], orthopedics.  In the ED, patient was given broad-spectrum antibiotics and was then admitted to hospital for further evaluation and treatment.  Assessment/Plan:  Principal Problem:   Gangrene (HCC) Active Problems:   Sepsis (HCC)   Unstageable pressure injury of skin and tissue (HCC)   Dementia without behavioral disturbance (HCC)   HTN (hypertension)   Lactic acidosis   Palliative care by specialist   Cellulitis of left foot  Left lower extremity gangrene/cellulitis.  Status post bilateral above-knee amputation on 06/03/2020. Received vancomycin and cefepime. Off antibiotics now.  Leukocytosis has normalized.    Currently on wound VAC which will need to be continued for total of 1 week, follow-up with Dr. [name] office in 1 week postoperatively.    History of multiple sclerosis bedbound status and severe limb contractures.  Continue supportive care. Very  advanced disease.  Currently on hospice care at home.    Pressure injury of skin and tissue  -Multiple pressure ulcers over the sacral/ buttocks area and lower extremities.  Present on admission.  Wound care on board.   Dementia without behavioral disturbance  Continue Aricept.  Supportive care.   Essential hypertension.   Continue to hold antihypertensives.     Lactic acidosis Improved after fluid bolus.    Hypokalemia.  Improved with IV potassium supplements.   Anemia likely secondary to postoperative acute blood loss.  Hemoglobin 6.5 on 06/05/20. Received I unit of PRBC.  Hemoglobin 8.7 today.   Poor appetite.  Resumed Remeron from home.  Nutritional supplements.  Patient likes Ensure.  We will add Ensure.  Thrombocytopenia.  Down to 80,000.  Hold heparin subcu for now from 132   DVT prophylaxis: heparin injection 5,000 Units Start: 06/01/20 0030 will discontinue  Code Status: Full code   Family Communication: none today.  I  spoke with the patient's daughter Ms Tracy Armstrong on the phone and updated her about the clinical condition and plan for disposition yesterday.  Status is: Inpatient  Remains inpatient appropriate because:Unsafe d/c plan, Inpatient level of care appropriate due to severity of illness, status post bilateral above-knee amputation on 06/03/2020, skilled nursing facility placement    Dispo: The patient is from:  Home hospice              Anticipated d/c is to: SNF as per PT. patient's daughter wishes skilled facility on discharge.  Transition of care on board              Anticipated d/c  date is: 1-2 days              Patient currently medically stable for discharge  Consultants:  Orthopedics Dr. [name]  Procedures:  Bilateral above-knee amputation on 06/03/2020  Antibiotics:  None today  Subjective:  Today, patient was seen and examined at bedside.  Does not report months.  Normally has low appetite.  Denies any overt pain.  No shortness of breath cough.     Objective: Vitals:   06/05/20 2117 06/06/20 0625  BP: 114/69 115/75  Pulse: 92 92  Resp: 12 14  Temp: 97.9 F (36.6 C) 97.9 F (36.6 C)  SpO2: 100% 100%    Intake/Output Summary (Last 24 hours) at 06/06/2020 0811 Last data filed at 06/06/2020 0600 Gross per 24 hour  Intake 485 ml  Output 0 ml  Net 485 ml   Filed Weights   05/31/20 2300 06/04/20 0511 06/05/20 2117  Weight: 63 kg 76.7 kg 76.2 kg   Body mass index is 27.12 kg/m.   Physical Exam:  GENERAL: Patient is alert awake and communicative. Not in obvious distress.  Thinly built. HENT: Mild pallor noted.  Pupils equally reactive to light. Oral mucosa is moist NECK: is supple, no gross swelling noted. CHEST:   Diminished breath sounds bilaterally. CVS: S1 and S2 heard, no murmur. Regular rate and rhythm.  ABDOMEN: Soft, non-tender, bowel sounds are present. EXTREMITIES: Bilateral above knee amputation with wound VACs.  Contractures of the upper extremities.   CNS: Cranial nerves are intact.  Chronic bedbound status. SKIN: Decubitus ulceration present on admission.  Bilateral above-knee amputation.  Data Review: I have personally reviewed the following laboratory data and studies,  CBC: Recent Labs  Lab 05/31/20 1633 05/31/20 1633 06/01/20 0514 06/02/20 0051 06/03/20 0607 06/04/20 0427 06/05/20 0946  WBC 15.2*   < > 12.5* 9.8 7.6 9.7 5.5  NEUTROABS 12.3*  --   --   --   --   --   --   HGB 10.5*   < > 9.3* 8.2* 8.0* 7.3* 6.5*  HCT 37.7   < > 31.9* 28.0* 27.2* 25.5* 21.8*  MCV 90.2   < > 90.4 88.1 88.9 88.9 85.2  PLT 179   < > 128* 122* 136* 132* PLATELET CLUMPS NOTED ON SMEAR, COUNT APPEARS DECREASED   < > = values in this interval not displayed.   Basic Metabolic Panel: Recent Labs  Lab 06/01/20 0514 06/02/20 0051 06/03/20 0607 06/04/20 0427 06/05/20 0946  NA 139 141 146* 146* 148*  K 3.9 3.5 2.9* 4.3 3.6  CL 108 114* 120* 120* 122*  CO2 23 20* 15* 17* 19*  GLUCOSE 105* 73 50* 83 113*  BUN 13  13 9 11 11   CREATININE 0.51 0.50 0.53 0.60 0.45  CALCIUM 8.1* 7.8* 7.7* 7.9* 7.8*  MG 1.7 1.6* 2.1 2.0  --   PHOS  --  3.3  --  2.8  --    Liver Function Tests: Recent Labs  Lab 05/31/20 1633 06/02/20 0051 06/04/20 0427  AST 14* 14* 13*  ALT 10 11 10   ALKPHOS 107 90 82  BILITOT 0.8 0.6 0.4  PROT 7.4 5.6* 5.4*  ALBUMIN 1.8* 1.3* 1.3*   No results for input(s): LIPASE, AMYLASE in the last 168 hours. No results for input(s): AMMONIA in the last 168 hours. Cardiac Enzymes: No results for input(s): CKTOTAL, CKMB, CKMBINDEX, TROPONINI in the last 168 hours. BNP (last 3 results) Recent Labs    05/15/20 0750 05/16/20 07/16/20  05/17/20 0435  BNP 149.8* 225.9* 99.0    ProBNP (last 3 results) No results for input(s): PROBNP in the last 8760 hours.  CBG: No results for input(s): GLUCAP in the last 168 hours. Recent Results (from the past 240 hour(s))  SARS Coronavirus 2 by RT PCR (hospital order, performed in [facility] hospital lab) Nasopharyngeal Nasopharyngeal Swab     Status: None   Collection Time: 05/31/20 11:16 PM   Specimen: Nasopharyngeal Swab  Result Value Ref Range Status   SARS Coronavirus 2 NEGATIVE NEGATIVE Final    Comment: (NOTE) SARS-CoV-2 target nucleic acids are NOT DETECTED.  The SARS-CoV-2 RNA is generally detectable in upper and lower respiratory specimens during the acute phase of infection. The lowest concentration of SARS-CoV-2 viral copies this assay can detect is 250 copies / mL. A negative result does not preclude SARS-CoV-2 infection and should not be used as the sole basis for treatment or other patient management decisions.  A negative result may occur with improper specimen collection / handling, submission of specimen other than nasopharyngeal swab, presence of viral mutation(s) within the areas targeted by this assay, and inadequate number of viral copies (<250 copies / mL). A negative result must be combined with clinical observations,  patient history, and epidemiological information.  Fact Sheet for Patients:   BoilerBrush.com.cy  Fact Sheet for Healthcare Providers: https://pope.com/  This test is not yet approved or  cleared by the [place] FDA and has been authorized for detection and/or diagnosis of SARS-CoV-2 by FDA under an Emergency Use Authorization (EUA).  This EUA will remain in effect (meaning this test can be used) for the duration of the COVID-19 declaration under Section 564(b)(1) of the Act, 21 U.S.C. section 360bbb-3(b)(1), unless the authorization is terminated or revoked sooner.  Performed at [facility], [address] [place], [place] [address]      Studies: No results found.    Joycelyn Das, MD  [facility] 06/06/2020

## 2020-06-06 NOTE — Progress Notes (Signed)
Patient had a small bowel movement with blood clot. MD made aware. Will continue to monitor.

## 2020-06-07 LAB — CBC
HCT: 28.8 % — ABNORMAL LOW (ref 36.0–46.0)
Hemoglobin: 9.1 g/dL — ABNORMAL LOW (ref 12.0–15.0)
MCH: 27.4 pg (ref 26.0–34.0)
MCHC: 31.6 g/dL (ref 30.0–36.0)
MCV: 86.7 fL (ref 80.0–100.0)
Platelets: 74 10*3/uL — ABNORMAL LOW (ref 150–400)
RBC: 3.32 MIL/uL — ABNORMAL LOW (ref 3.87–5.11)
RDW: 16.9 % — ABNORMAL HIGH (ref 11.5–15.5)
WBC: 5.1 10*3/uL (ref 4.0–10.5)
nRBC: 0.4 % — ABNORMAL HIGH (ref 0.0–0.2)

## 2020-06-07 LAB — BASIC METABOLIC PANEL
Anion gap: 7 (ref 5–15)
BUN: 8 mg/dL (ref 8–23)
CO2: 22 mmol/L (ref 22–32)
Calcium: 7.6 mg/dL — ABNORMAL LOW (ref 8.9–10.3)
Chloride: 119 mmol/L — ABNORMAL HIGH (ref 98–111)
Creatinine, Ser: 0.34 mg/dL — ABNORMAL LOW (ref 0.44–1.00)
GFR calc Af Amer: >60 mL/min (ref >60)
GFR calc non Af Amer: >60 mL/min (ref >60)
Glucose, Bld: 93 mg/dL (ref 70–99)
Potassium: 3.2 mmol/L — ABNORMAL LOW (ref 3.5–5.1)
Sodium: 148 mmol/L — ABNORMAL HIGH (ref 135–145)

## 2020-06-07 MED ORDER — POTASSIUM CHLORIDE CRYS ER 20 MEQ PO TBCR
40.0000 meq | EXTENDED_RELEASE_TABLET | Freq: Every day | ORAL | Status: DC
  Administered 2020-06-07 – 2020-06-13 (×7): 40 meq via ORAL
  Filled 2020-06-07 (×7): qty 2

## 2020-06-07 NOTE — NC FL2 (Signed)
[place] MEDICAID FL2 LEVEL OF CARE SCREENING TOOL     IDENTIFICATION  [name] Birthdate: 1946/01/24 Sex: female Admission Date (Current Location): 05/31/2020  [place] and [place] Number:  Aaron Edelman (Patient lives in [place], [place])   Facility and Address:  [facility] [address], [place], [place] [address]      Provider Number: [id number]  Attending Physician Name and Address:  Joycelyn Das, MD  Relative Name and Phone Number:  Frederich Chick - daughter; [phone]    Current Level of Care: Hospital Recommended Level of Care: Skilled Nursing Facility Prior Approval Number:    Date Approved/Denied:   PASRR Number: [id number]  Discharge Plan: SNF    Current Diagnoses: Patient Active Problem List   Diagnosis Date Noted  . Gangrene (HCC) 05/31/2020  . Cellulitis of left foot 05/31/2020  . Subacute osteomyelitis, left ankle and foot (HCC)   . Palliative care by specialist   . Goals of care, counseling/discussion   . DNR (do not resuscitate) discussion   . Sepsis (HCC) 05/13/2020  . Multiple sclerosis (HCC) 05/13/2020  . Unstageable pressure injury of skin and tissue (HCC) 05/13/2020  . Dementia without behavioral disturbance (HCC) 05/13/2020  . HTN (hypertension) 05/13/2020  . Lactic acidosis 05/13/2020    Orientation RESPIRATION BLADDER Height & Weight     Self  Normal Incontinent Weight: 168 lb (76.2 kg) Height:  5\' 6"  (167.6 cm)  BEHAVIORAL SYMPTOMS/MOOD NEUROLOGICAL BOWEL NUTRITION STATUS      Incontinent Diet (Carb modified)  AMBULATORY STATUS COMMUNICATION OF NEEDS Skin   Total Care (Patient bilateral AKA) Verbally Other (Comment) (Unstageable pressure injury to bilateral buttocks, MASD right/left groin; negative pressure wound therapy to thigh anterior distal left & right. Continuous cycle-125 target pressure-12mL instillation volume)                       Personal Care Assistance Level of  Assistance  Bathing, Feeding, Dressing Bathing Assistance: Maximum assistance Feeding assistance: Maximum assistance Dressing Assistance: Maximum assistance     Functional Limitations Info  Sight, Hearing, Speech Sight Info: Adequate Hearing Info: Adequate Speech Info: Adequate    SPECIAL CARE FACTORS FREQUENCY  PT (By licensed PT), OT (By licensed OT)     PT Frequency: Evaluated 7/30. PT at SNF a minimum of 5 days per week OT Frequency: Evaluated 7/31. OT at SNF a minimum of 5 days per week            Contractures Contractures Info: Not present    Additional Factors Info  Code Status, Allergies Code Status Info: Full Allergies Info: No known allergies           Current Medications (06/07/2020):  This is the current hospital active medication list Current Facility-Administered Medications  Medication Dose Route Frequency Provider Last Rate Last Admin  . 0.9 %  sodium chloride infusion (Manually program via Guardrails IV Fluids)   Intravenous Once Pokhrel, [place], MD      . acetaminophen (TYLENOL) tablet 650 mg  650 mg Oral Q6H PRN Persons, 08/07/2020, [place]   650 mg at 06/06/20 2302   Or  . acetaminophen (TYLENOL) suppository 650 mg  650 mg Rectal Q6H PRN Persons, 2303, PA      . docusate sodium (COLACE) capsule 100 mg  100 mg Oral BID Persons, [place], PA   100 mg at 06/07/20 0839  . feeding supplement (ENSURE ENLIVE) (ENSURE ENLIVE) liquid 237 mL  237  mL Oral BID BM Pokhrel, Laxman, MD   237 mL at 06/07/20 0838  . HYDROmorphone (DILAUDID) injection 0.5 mg  0.5 mg Intravenous Q4H PRN Persons, [place], PA   0.5 mg at 06/05/20 1551  . mirtazapine (REMERON) tablet 7.5 mg  7.5 mg Oral QHS Pokhrel, Laxman, MD   7.5 mg at 06/06/20 2300  . ondansetron (ZOFRAN) injection 4 mg  4 mg Intravenous Q6H PRN Persons, [place], PA      . oxyCODONE (Oxy IR/ROXICODONE) immediate release tablet 10-15 mg  10-15 mg Oral Q4H PRN Persons, [place], [place]      . oxyCODONE (Oxy  IR/ROXICODONE) immediate release tablet 5-10 mg  5-10 mg Oral Q4H PRN Persons, [place], PA   5 mg at 06/04/20 0903  . pantoprazole (PROTONIX) EC tablet 40 mg  40 mg Oral Daily Pokhrel, Laxman, MD   40 mg at 06/07/20 0839  . potassium chloride SA (KLOR-CON) CR tablet 40 mEq  40 mEq Oral Daily Pokhrel, Laxman, MD   40 mEq at 06/07/20 1559  . sodium chloride flush (NS) 0.9 % injection 3 mL  3 mL Intravenous Once Persons, [place], PA         Discharge Medications: Please see discharge summary for a list of discharge medications.  Relevant Imaging Results:  Relevant Lab Results:   Additional Information ss#[id number]  Cristobal Goldmann, LCSW

## 2020-06-07 NOTE — Clinical Social Work Note (Signed)
5:05 pm - ST rehab is beng recommended for patient and [employer] was contacted to determine if they manage patient's insurance. Spoke with Melissa who indicated that she will need to do an eligibility check on patient. She will send information off and will get back with CSW in 20/30 minutes.   Genelle Bal, MSW, LCSW Licensed Clinical Social Worker Clinical Social Work Department [employer] [phone]

## 2020-06-07 NOTE — TOC Initial Note (Signed)
Transition of Care [facility]) - Initial/Assessment Note    Patient Details  Name: Tracy Armstrong MRN: [id number] Date of Birth: [birth year]  Transition of Care [facility]) CM/SW Contact:    Tracy Goldmann, LCSW Phone Number: 06/07/2020, 4:30 PM  Clinical Narrative:  Talked with daughter Tracy Armstrong on 06/06/20 regarding patient's discharge plan and recommendation of ST rehab. Daughter agreeable with ST rehab and facility preference is [facility].  Per [name], she talked with [name], admissions director today (8/2) and was given paperwork. Daughter added that her mother has been to [facility] before, in 2012 or 2013.   Tracy Armstrong reported that she and her mother live together and she is her mother's caregiver and has been taking care of her mom for a long time.  Per daughter, her mother has been vaccinated. Daughter added that she has 2 older sister who are involved.                 Expected Discharge Plan: Skilled Nursing Facility Barriers to Discharge: Insurance Authorization   Patient Goals and CMS Choice Patient states their goals for this hospitalization and ongoing recovery are:: Daughter agreeable to ST rehab before patient returns home CMS Medicare.gov Compare Post Acute Care list provided to:: Patient Represenative (must comment) (Daughter advised regarding [email or web address] Choice offered to / list presented to : Adult Children  Expected Discharge Plan and Services Expected Discharge Plan: Skilled Nursing Facility In-house Referral: Clinical Social Work                                            Prior Living Arrangements/Services   Lives with:: Adult Children Patient language and need for interpreter reviewed:: No Do you feel safe going back to the place where you live?: No      Need for Family Participation in Patient Care: Yes (Comment) Care giver support system in place?: Yes (comment)   Criminal Activity/Legal Involvement Pertinent to Current  Situation/Hospitalization: No - Comment as needed  Activities of Daily Living      Permission Sought/Granted Permission sought to share information with :  (No, as patient oriented to self only) Permission granted to share information with :  (Patient oriented to self only)              Emotional Assessment Appearance:: Other (Comment Required (Did not visit with patient, talked with daughter by phone) Attitude/Demeanor/Rapport: Unable to Assess Affect (typically observed): Unable to Assess Orientation: : Oriented to Self Alcohol / Substance Use: Tobacco Use, Alcohol Use, Illicit Drugs (Per H&P, patient has never smoked and does not drink or use illicit drugs) Psych Involvement: No (comment)  Admission diagnosis:  Sepsis (HCC) [A41.9] Gangrene of left foot (HCC) [I96] Sepsis, due to unspecified organism, unspecified whether acute organ dysfunction present [facility]) [A41.9] Patient Active Problem List   Diagnosis Date Noted  . Gangrene (HCC) 05/31/2020  . Cellulitis of left foot 05/31/2020  . Subacute osteomyelitis, left ankle and foot (HCC)   . Palliative care by specialist   . Goals of care, counseling/discussion   . DNR (do not resuscitate) discussion   . Sepsis (HCC) 05/13/2020  . Multiple sclerosis (HCC) 05/13/2020  . Unstageable pressure injury of skin and tissue (HCC) 05/13/2020  . Dementia without behavioral disturbance (HCC) 05/13/2020  . HTN (hypertension) 05/13/2020  . Lactic acidosis 05/13/2020   PCP:  [name], MD Pharmacy:  [name], [place] - [address] [place] [address] Phone: [phone] Fax: [phone]     Social Determinants of Health (SDOH) Interventions  No SDOH interventions requested or needed at this time.  Readmission Risk Interventions No flowsheet data found.

## 2020-06-07 NOTE — Progress Notes (Signed)
PROGRESS NOTE  Tracy Armstrong BTY:[id number] DOB: 08/22/1946 DOA: 05/31/2020 PCP: [name], MD   LOS: 7 days   Brief narrative: As per HPI,  This is a 74 year old female with past medical history of multiple sclerosis, dementia, hypertension and chronic bedbound status who was recently  discharged on 7/14 with a diagnosis of coag negative staph bacteremia from left lower extremity cellulitis with osteomyelitis that is chronic as well as chronic decubiti right lower extremity.  She was discharged home with home hospice and oral antibiotics.    During the previous admission, there was discussion about  BKA however after discussion with palliative this was not done given the patient many comorbidities.  She was discharged with treatment and goals directed towards comfort.  Patient presented this time with worsening lower extremity decubital/ulcer.  Per daughter, hospice stated that they have done everything they can.  Daughter was advised to bring the patient back to the ER.  Prior to coming to the ER, the family spoke with Dr. [name], orthopedics.  In the ED, patient was given broad-spectrum antibiotics and was then admitted to hospital for further evaluation and treatment.  Assessment/Plan:  Principal Problem:   Gangrene (HCC) Active Problems:   Sepsis (HCC)   Unstageable pressure injury of skin and tissue (HCC)   Dementia without behavioral disturbance (HCC)   HTN (hypertension)   Lactic acidosis   Palliative care by specialist   Cellulitis of left foot  Left lower extremity gangrene/cellulitis.  Status post bilateral above-knee amputation on 06/03/2020. Received vancomycin and cefepime. Off antibiotics now.  Leukocytosis has normalized.    Currently on wound VAC which will need to be continued for total of 1 week, follow-up with Dr. [name] office in 1 week postoperatively.    One episode of small rectal bleed.  On 06/06/2020. no further bleed.  Not a good candidate for invasive work-up.   Add Protonix.  History of multiple sclerosis bedbound status and severe limb contractures.  Continue supportive care. Very advanced disease.  Currently on hospice care at home.    Pressure injury of skin and tissue  -Multiple pressure ulcers over the sacral/ buttocks area.  Present on admission.  Wound care on board.   Dementia without behavioral disturbance  Continue Aricept.  Supportive care.  Failure to thrive, poor oral appetite at baseline.  On Remeron will continue with that.  Continue Ensure nutritional supplements.   Essential hypertension.   Continue to hold antihypertensives.  Latest blood pressure of 121/68    Lactic acidosis Improved after fluid bolus.    Hypokalemia.  Improved with IV potassium supplements.   Anemia likely secondary to postoperative acute blood loss.  Hemoglobin 6.5 on 06/05/20. Received I unit of PRBC.  Hemoglobin of 9.1 today.   Thrombocytopenia.  Down to 74 from 80,000.  Hold heparin subcu for now  Mild hypokalemia.  Will replace orally.  BMP in a.m.   DVT prophylaxis: Off heparin subcu.   Code Status: Full code.  Had spoken with the patient's daughter about it.   Family Communication:   I  spoke with the patient's daughter [name] on the phone and updated her about the clinical condition and plan for disposition yesterday.  Status is: Inpatient  Remains inpatient appropriate because:Unsafe d/c plan, Inpatient level of care appropriate due to severity of illness, status post bilateral above-knee amputation on 06/03/2020, skilled nursing facility placement    Dispo: The patient is from:  Home hospice  Anticipated d/c is to: SNF as per PT. Patient's daughter wishes skilled facility on discharge.  Transition of care on board              Anticipated d/c date is: 1-2 days              Patient currently medically stable for discharge  Consultants:  Orthopedics Dr. [name]  Procedures:  Bilateral above-knee amputation on  06/03/2020  Antibiotics:  None   Subjective:  Today, patient was seen and examined at bedside.  Poor historian.  Denies any pain, nausea, vomiting, fever.  Normally has low appetite.    Objective: Vitals:   06/07/20 0447 06/07/20 0920  BP: 123/73 121/68  Pulse: 97 90  Resp: 15 16  Temp: 98.4 F (36.9 C) 98 F (36.7 C)  SpO2: 100% 99%    Intake/Output Summary (Last 24 hours) at 06/07/2020 1458 Last data filed at 06/07/2020 1345 Gross per 24 hour  Intake 420 ml  Output 0 ml  Net 420 ml   Filed Weights   05/31/20 2300 06/04/20 0511 06/05/20 2117  Weight: 63 kg 76.7 kg 76.2 kg   Body mass index is 27.12 kg/m.   Physical Exam:  GENERAL: Patient is alert awake and communicative. Not in obvious distress.  Thinly built. HENT: Mild pallor noted.  Pupils equally reactive to light. Oral mucosa is moist NECK: is supple, no gross swelling noted. CHEST:   Diminished breath sounds bilaterally. CVS: S1 and S2 heard, no murmur. Regular rate and rhythm.  ABDOMEN: Soft, non-tender, bowel sounds are present. EXTREMITIES: Bilateral above knee amputation with wound VACs.  Contractures of the upper extremities.   CNS: Cranial nerves are intact.  Chronic bedbound status. SKIN: Decubitus ulceration present on admission.  Bilateral above-knee amputation.  Data Review: I have personally reviewed the following laboratory data and studies,  CBC: Recent Labs  Lab 05/31/20 1633 06/01/20 0514 06/03/20 0607 06/04/20 0427 06/05/20 0946 06/06/20 0853 06/07/20 0255  WBC 15.2*   < > 7.6 9.7 5.5 4.8 5.1  NEUTROABS 12.3*  --   --   --   --   --   --   HGB 10.5*   < > 8.0* 7.3* 6.5* 8.7* 9.1*  HCT 37.7   < > 27.2* 25.5* 21.8* 27.7* 28.8*  MCV 90.2   < > 88.9 88.9 85.2 87.7 86.7  PLT 179   < > 136* 132* PLATELET CLUMPS NOTED ON SMEAR, COUNT APPEARS DECREASED 80* 74*   < > = values in this interval not displayed.   Basic Metabolic Panel: Recent Labs  Lab 06/01/20 0514 06/01/20 0514  06/02/20 0051 06/03/20 0607 06/04/20 0427 06/05/20 0946 06/07/20 0255  NA 139   < > 141 146* 146* 148* 148*  K 3.9   < > 3.5 2.9* 4.3 3.6 3.2*  CL 108   < > 114* 120* 120* 122* 119*  CO2 23   < > 20* 15* 17* 19* 22  GLUCOSE 105*   < > 73 50* 83 113* 93  BUN 13   < > 13 9 11 11 8   CREATININE 0.51   < > 0.50 0.53 0.60 0.45 0.34*  CALCIUM 8.1*   < > 7.8* 7.7* 7.9* 7.8* 7.6*  MG 1.7  --  1.6* 2.1 2.0  --   --   PHOS  --   --  3.3  --  2.8  --   --    < > = values in this interval not  displayed.   Liver Function Tests: Recent Labs  Lab 05/31/20 1633 06/02/20 0051 06/04/20 0427  AST 14* 14* 13*  ALT 10 11 10   ALKPHOS 107 90 82  BILITOT 0.8 0.6 0.4  PROT 7.4 5.6* 5.4*  ALBUMIN 1.8* 1.3* 1.3*   No results for input(s): LIPASE, AMYLASE in the last 168 hours. No results for input(s): AMMONIA in the last 168 hours. Cardiac Enzymes: No results for input(s): CKTOTAL, CKMB, CKMBINDEX, TROPONINI in the last 168 hours. BNP (last 3 results) Recent Labs    05/15/20 0750 05/16/20 0659 05/17/20 0435  BNP 149.8* 225.9* 99.0    ProBNP (last 3 results) No results for input(s): PROBNP in the last 8760 hours.  CBG: No results for input(s): GLUCAP in the last 168 hours. Recent Results (from the past 240 hour(s))  SARS Coronavirus 2 by RT PCR (hospital order, performed in [facility] hospital lab) Nasopharyngeal Nasopharyngeal Swab     Status: None   Collection Time: 05/31/20 11:16 PM   Specimen: Nasopharyngeal Swab  Result Value Ref Range Status   SARS Coronavirus 2 NEGATIVE NEGATIVE Final    Comment: (NOTE) SARS-CoV-2 target nucleic acids are NOT DETECTED.  The SARS-CoV-2 RNA is generally detectable in upper and lower respiratory specimens during the acute phase of infection. The lowest concentration of SARS-CoV-2 viral copies this assay can detect is 250 copies / mL. A negative result does not preclude SARS-CoV-2 infection and should not be used as the sole basis for treatment  or other patient management decisions.  A negative result may occur with improper specimen collection / handling, submission of specimen other than nasopharyngeal swab, presence of viral mutation(s) within the areas targeted by this assay, and inadequate number of viral copies (<250 copies / mL). A negative result must be combined with clinical observations, patient history, and epidemiological information.  Fact Sheet for Patients:   06/02/20  Fact Sheet for Healthcare Providers: BoilerBrush.com.cy  This test is not yet approved or  cleared by the https://pope.com/ FDA and has been authorized for detection and/or diagnosis of SARS-CoV-2 by FDA under an Emergency Use Authorization (EUA).  This EUA will remain in effect (meaning this test can be used) for the duration of the COVID-19 declaration under Section 564(b)(1) of the Act, 21 U.S.C. section 360bbb-3(b)(1), unless the authorization is terminated or revoked sooner.  Performed at [facility], [address], [place], [place]      Studies: No results found.    [id number], MD  [facility] 06/07/2020

## 2020-06-08 LAB — CBC
HCT: 31.8 % — ABNORMAL LOW (ref 36.0–46.0)
Hemoglobin: 9.7 g/dL — ABNORMAL LOW (ref 12.0–15.0)
MCH: 27 pg (ref 26.0–34.0)
MCHC: 30.5 g/dL (ref 30.0–36.0)
MCV: 88.6 fL (ref 80.0–100.0)
Platelets: 78 10*3/uL — ABNORMAL LOW (ref 150–400)
RBC: 3.59 MIL/uL — ABNORMAL LOW (ref 3.87–5.11)
RDW: 17.7 % — ABNORMAL HIGH (ref 11.5–15.5)
WBC: 9.1 10*3/uL (ref 4.0–10.5)
nRBC: 0.2 % (ref 0.0–0.2)

## 2020-06-08 LAB — BASIC METABOLIC PANEL
Anion gap: 7 (ref 5–15)
BUN: 8 mg/dL (ref 8–23)
CO2: 24 mmol/L (ref 22–32)
Calcium: 7.9 mg/dL — ABNORMAL LOW (ref 8.9–10.3)
Chloride: 117 mmol/L — ABNORMAL HIGH (ref 98–111)
Creatinine, Ser: 0.4 mg/dL — ABNORMAL LOW (ref 0.44–1.00)
GFR calc Af Amer: >60 mL/min (ref >60)
GFR calc non Af Amer: >60 mL/min (ref >60)
Glucose, Bld: 71 mg/dL (ref 70–99)
Potassium: 4.1 mmol/L (ref 3.5–5.1)
Sodium: 148 mmol/L — ABNORMAL HIGH (ref 135–145)

## 2020-06-08 LAB — SODIUM: Sodium: 143 mmol/L (ref 135–145)

## 2020-06-08 NOTE — Progress Notes (Signed)
PROGRESS NOTE    Tracy Armstrong  AST:[id number] DOB: [birth year] DOA: 05/31/2020 PCP: [name], MD   Brief Narrative: Tracy Armstrong is a 74 y.o. female with past medical history of multiple sclerosis, dementia, hypertension and chronic bedbound status. Patient presented secondary to worsening lower extremity wounds leading to AKA to treat gangrene and osteomyelitis.   Assessment & Plan:   Principal Problem:   Gangrene (HCC) Active Problems:   Sepsis (HCC)   Unstageable pressure injury of skin and tissue (HCC)   Dementia without behavioral disturbance (HCC)   HTN (hypertension)   Lactic acidosis   Palliative care by specialist   Cellulitis of left foot   Bilateral LE gangrene and osteomyelitis Severe sepsis Sepsis present on admission with tachypnea, tachycardia and lactic acidosis. Patient initially managed on Vancomycin, Cefepime and Flagyl. Orthopedic surgery consulted and performed bilateral AKA with wound vac placement on 7/30. Sepsis physiology resolved. -Orthopedic surgery recommendations: up with therapy  Rectal bleed One occurrence per chart review. Hemoglobin stable.  History of multiple sclerosis Bed bound. On hospice care per chart review.  Dementia -Continue Aricept  Essential hypertension Hypotension documented earlier in admission.  Lactic acidosis In setting of infection but unknown etiology. Resolved.  Hypokalemia Resolved with supplementation  Hypernatremia Secondary to decreased free water intake. -Repeat Sodium -Encouraged patient to intake more free water  Anemia Acute blood loss Secondary to procedure. Received 1 unit of PRBC.  Thrombocytopenia Acute. Unknown etiology. Stable, so can follow-up outpatient.  Hypokalemia Supplementation given. Resolved.  Pressure injury Medial, bilateral buttock, POA.    DVT prophylaxis: None in setting of thrombocytopenia and bilateral AKA Code Status:   Code Status: Full Code Family  Communication: None at bedside Disposition Plan: Discharge tomorrow pending bed availability   Consultants:   Orthopedic surgery  Procedures:   BILATERAL ABOVE KNEE AMPUTATIONS (06/03/2020)  Antimicrobials:  Vancomycin  Cefepime    Subjective: No issues.  Objective: Vitals:   06/07/20 2143 06/08/20 0621 06/08/20 1023 06/08/20 1707  BP: 125/66 131/71 (!) 142/78 120/79  Pulse: (!) 106 (!) 101 (!) 102 (!) 108  Resp: 16 16 18 18   Temp: (!) 97.5 F (36.4 C) 98.5 F (36.9 C) 97.8 F (36.6 C) 97.8 F (36.6 C)  TempSrc: Oral Oral Oral Oral  SpO2: 99% 97% 100% 98%  Weight:      Height:        Intake/Output Summary (Last 24 hours) at 06/08/2020 1807 Last data filed at 06/08/2020 1300 Gross per 24 hour  Intake 240 ml  Output 0 ml  Net 240 ml   Filed Weights   05/31/20 2300 06/04/20 0511 06/05/20 2117  Weight: 63 kg 76.7 kg 76.2 kg    Examination:  General exam: Appears calm and comfortable Respiratory system: Clear to auscultation. Respiratory effort normal. Cardiovascular system: S1 & S2 heard, RRR. Gastrointestinal system: Abdomen is nondistended, soft and nontender. No organomegaly or masses felt. Normal bowel sounds heard. Central nervous system: Alert and oriented.  Musculoskeletal: Bilateral AKA with wound vacs on bilateral stumps. No calf tenderness Skin: No cyanosis. No rashes Psychiatry: Judgement and insight appear normal. Mood & affect appropriate.     Data Reviewed: I have personally reviewed following labs and imaging studies  CBC Lab Results  Component Value Date   WBC 9.1 06/08/2020   RBC 3.59 (L) 06/08/2020   HGB 9.7 (L) 06/08/2020   HCT 31.8 (L) 06/08/2020   MCV 88.6 06/08/2020   MCH 27.0 06/08/2020   PLT 78 (L)  06/08/2020   MCHC 30.5 06/08/2020   RDW 17.7 (H) 06/08/2020   LYMPHSABS 1.9 05/31/2020   MONOABS 0.8 05/31/2020   EOSABS 0.1 05/31/2020   BASOSABS 0.0 05/31/2020     Last metabolic panel Lab Results  Component Value Date     NA 148 (H) 06/08/2020   K 4.1 06/08/2020   CL 117 (H) 06/08/2020   CO2 24 06/08/2020   BUN 8 06/08/2020   CREATININE 0.40 (L) 06/08/2020   GLUCOSE 71 06/08/2020   GFRNONAA >60 06/08/2020   GFRAA >60 06/08/2020   CALCIUM 7.9 (L) 06/08/2020   PHOS 2.8 06/04/2020   PROT 5.4 (L) 06/04/2020   ALBUMIN 1.3 (L) 06/04/2020   BILITOT 0.4 06/04/2020   ALKPHOS 82 06/04/2020   AST 13 (L) 06/04/2020   ALT 10 06/04/2020   ANIONGAP 7 06/08/2020    CBG (last 3)  No results for input(s): GLUCAP in the last 72 hours.   GFR: Estimated Creatinine Clearance: 66.3 mL/min (A) (by C-G formula based on SCr of 0.4 mg/dL (L)).  Coagulation Profile: No results for input(s): INR, PROTIME in the last 168 hours.  Recent Results (from the past 240 hour(s))  SARS Coronavirus 2 by RT PCR (hospital order, performed in [facility] hospital lab) Nasopharyngeal Nasopharyngeal Swab     Status: None   Collection Time: 05/31/20 11:16 PM   Specimen: Nasopharyngeal Swab  Result Value Ref Range Status   SARS Coronavirus 2 NEGATIVE NEGATIVE Final    Comment: (NOTE) SARS-CoV-2 target nucleic acids are NOT DETECTED.  The SARS-CoV-2 RNA is generally detectable in upper and lower respiratory specimens during the acute phase of infection. The lowest concentration of SARS-CoV-2 viral copies this assay can detect is 250 copies / mL. A negative result does not preclude SARS-CoV-2 infection and should not be used as the sole basis for treatment or other patient management decisions.  A negative result may occur with improper specimen collection / handling, submission of specimen other than nasopharyngeal swab, presence of viral mutation(s) within the areas targeted by this assay, and inadequate number of viral copies (<250 copies / mL). A negative result must be combined with clinical observations, patient history, and epidemiological information.  Fact Sheet for Patients:    BoilerBrush.com.cy  Fact Sheet for Healthcare Providers: https://pope.com/  This test is not yet approved or  cleared by the [place] FDA and has been authorized for detection and/or diagnosis of SARS-CoV-2 by FDA under an Emergency Use Authorization (EUA).  This EUA will remain in effect (meaning this test can be used) for the duration of the COVID-19 declaration under Section 564(b)(1) of the Act, 21 U.S.C. section 360bbb-3(b)(1), unless the authorization is terminated or revoked sooner.  Performed at [facility], [address] [place], [place] [address]         Radiology Studies: No results found.      Scheduled Meds: . sodium chloride   Intravenous Once  . docusate sodium  100 mg Oral BID  . feeding supplement (ENSURE ENLIVE)  237 mL Oral BID BM  . mirtazapine  7.5 mg Oral QHS  . pantoprazole  40 mg Oral Daily  . potassium chloride  40 mEq Oral Daily  . sodium chloride flush  3 mL Intravenous Once   Continuous Infusions:   LOS: 8 days     Jacquelin Hawking, MD [facility] 06/08/2020, 6:07 PM  If 7PM-7AM, please contact night-coverage [email or web address]

## 2020-06-09 LAB — BPAM RBC
Blood Product Expiration Date: 202108312359
Blood Product Expiration Date: 202108312359
ISSUE DATE / TIME: 202108011653
Unit Type and Rh: 7300
Unit Type and Rh: 7300

## 2020-06-09 LAB — TYPE AND SCREEN
ABO/RH(D): B POS
Antibody Screen: NEGATIVE
Unit division: 0
Unit division: 0

## 2020-06-09 NOTE — Evaluation (Signed)
Physical Therapy Evaluation Patient Details Name: Tracy Armstrong MRN: [id number] DOB: [birth year] Today's Date: 06/09/2020   History of Present Illness  Pt is a 74 y/o female s/p bilateral transfemoral amputations secondary to worsening gangrenous ulcers of both feet. PMH including but not limited to MS. Pt underwent bilateral AKA on 06/03/2020  Clinical Impression  Pt presents with significant deficits in mobility, balance, ROM, strength, cognition, and awareness, however all of these may be at baseline as the pt was totalA for mobility and all ADLs except for feeding per chart review from prior admission. While pt may be at baseline the patient will benefit from continued acute PT POC for family training on transfers, HEP, and precautions as the pt is now bilateral AKA. Due to new WB precautions and change in status PT currently recommends SNF placement to provide wound care and to provide further transfer training. Per chart review from prior admission the family has refused use of lift in the past, this may be the safest way to transfer the patient at this time, if family continues to refuse use of lift then they will need to be educated on performing lateral or AP scooting transfers.    Follow Up Recommendations SNF    Equipment Recommendations  Other (comment) (mechanical lift)    Recommendations for Other Services       Precautions / Restrictions Precautions Precautions: Other (comment) Precaution Comments: bilateral AKA's with wound VACs Restrictions Weight Bearing Restrictions: Yes RLE Weight Bearing: Non weight bearing LLE Weight Bearing: Non weight bearing      Mobility  Bed Mobility Overal bed mobility: Needs Assistance Bed Mobility: Rolling;Supine to Sit (supine to long sitting) Rolling: Total assist   Supine to sit: Total assist     General bed mobility comments: pt with posterior pelvic tilt and posterior lean in long sitting  Transfers                  General transfer comment: per chart review family has reported in the past that hoyer lift transfer scares the pt too much and family prefers to pick up the patient for transfers  Ambulation/Gait                Stairs            Wheelchair Mobility    Modified Rankin (Stroke Patients Only)       Balance Overall balance assessment: Needs assistance Sitting-balance support: No upper extremity supported;Feet supported Sitting balance-Leahy Scale: Zero Sitting balance - Comments: totalA posterior lean Postural control: Posterior lean                                   Pertinent Vitals/Pain Pain Assessment: Faces Faces Pain Scale: Hurts even more Pain Location: generalized with long sitting Pain Descriptors / Indicators: Grimacing Pain Intervention(s): Monitored during session    Home Living Family/patient expects to be discharged to:: Private residence Living Arrangements: Children Available Help at Discharge: Family;Available 24 hours/day Type of Home: House Home Access: Ramped entrance     Home Layout: One level Home Equipment: Wheelchair - manual;Hospital bed Additional Comments: all information taken from previous admission due to pt inability to report    Prior Function Level of Independence: Needs assistance   Gait / Transfers Assistance Needed: total assist for bed mobility, transfer to [facility]  ADL's / Homemaking Assistance Needed: pt able to feed herself at baseline per previous admission,  otherwise total care        Hand Dominance   Dominant Hand: Left    Extremity/Trunk Assessment   Upper Extremity Assessment Upper Extremity Assessment: RUE deficits/detail;LUE deficits/detail RUE Deficits / Details: ~10 degrees passive shoulder flexion, -90 degrees elbow extension, wrist extension limited to neutral. Very limited and non-functional AROM of RUE LUE Deficits / Details: gorssly contracted with 20 degrees of passive shoulder flexion,  -90 degrees elbow extension, wist flexion contracture and 4th/5th digits contracted into flexion. Pt able to touch face, mouth and forehead but not back of head or back    Lower Extremity Assessment Lower Extremity Assessment: RLE deficits/detail;LLE deficits/detail RLE Deficits / Details: pt unable to actively extend hips or abduct hips at this time. Hips in ~30 degress flexion upon PT arrival, PT defers PROM assessment due to bilateral wound vacs covering majority of residual limbs LLE Deficits / Details: pt unable to actively extend hips or abduct hips at this time. Hips in ~30 degress flexion upon PT arrival, PT defers PROM assessment due to bilateral wound vacs covering majority of residual limbs    Cervical / Trunk Assessment Cervical / Trunk Assessment: Kyphotic  Communication   Communication: Expressive difficulties  Cognition Arousal/Alertness: Awake/alert Behavior During Therapy: Flat affect Overall Cognitive Status: History of cognitive impairments - at baseline                                 General Comments: Baseline dementia. Pt follows simple one step verbal commands, disoriented to time, situation.      General Comments      Exercises     Assessment/Plan    PT Assessment Patient needs continued PT services  PT Problem List Decreased range of motion;Decreased balance;Decreased mobility;Decreased knowledge of use of DME;Decreased knowledge of precautions;Decreased cognition       PT Treatment Interventions DME instruction;Functional mobility training;Therapeutic activities;Therapeutic exercise;Balance training;Neuromuscular re-education;Cognitive remediation;Patient/family education    PT Goals (Current goals can be found in the Care Plan section)  Acute Rehab PT Goals Patient Stated Goal: pt does not state, PT goal to provide transfer training with family as pt now bilateral AKA andhave refused lift in past PT Goal Formulation: Patient unable to  participate in goal setting Time For Goal Achievement: 06/23/20 Potential to Achieve Goals: Fair    Frequency Min 2X/week   Barriers to discharge        Co-evaluation               AM-PAC PT "6 Clicks" Mobility  Outcome Measure Help needed turning from your back to your side while in a flat bed without using bedrails?: Total Help needed moving from lying on your back to sitting on the side of a flat bed without using bedrails?: Total Help needed moving to and from a bed to a chair (including a wheelchair)?: Total Help needed standing up from a chair using your arms (e.g., wheelchair or bedside chair)?: Total Help needed to walk in hospital room?: Total Help needed climbing 3-5 steps with a railing? : Total 6 Click Score: 6    End of Session   Activity Tolerance: Patient tolerated treatment well Patient left: in bed;with call bell/phone within reach Nurse Communication: Mobility status;Need for lift equipment PT Visit Diagnosis: Other abnormalities of gait and mobility (R26.89);Muscle weakness (generalized) (M62.81);Other symptoms and signs involving the nervous system (R29.898)    Time: 1449-1500 PT Time Calculation (min) (ACUTE ONLY): 11  min   Charges:   PT Evaluation $PT Eval Moderate Complexity: 1 Mod          Arlyss Gandy, PT, DPT Acute Rehabilitation Pager: [phone]   Arlyss Gandy 06/09/2020, 3:20 PM

## 2020-06-09 NOTE — Progress Notes (Signed)
PROGRESS NOTE    Tracy Armstrong  URK:[id number] DOB: 1946/05/30 DOA: 05/31/2020 PCP: [name], MD   Brief Narrative: Tracy Armstrong is a 74 y.o. female with past medical history of multiple sclerosis, dementia, hypertension and chronic bedbound status. Patient presented secondary to worsening lower extremity wounds leading to AKA to treat gangrene and osteomyelitis.   Assessment & Plan:   Principal Problem:   Gangrene (HCC) Active Problems:   Sepsis (HCC)   Unstageable pressure injury of skin and tissue (HCC)   Dementia without behavioral disturbance (HCC)   HTN (hypertension)   Lactic acidosis   Palliative care by specialist   Cellulitis of left foot   Bilateral LE gangrene and osteomyelitis Severe sepsis Sepsis present on admission with tachypnea, tachycardia and lactic acidosis. Patient initially managed on Vancomycin, Cefepime and Flagyl. Orthopedic surgery consulted and performed bilateral AKA with wound vac placement on 7/30. Sepsis physiology resolved. -Orthopedic surgery recommendations: up with therapy  Rectal bleed One occurrence per chart review. Hemoglobin stable.  History of multiple sclerosis Bed bound as an outpatient. On hospice care per chart review. Will need therapy to increase transfer ability/improve functional capacity to be as independent as possible as an outpatient.  Dementia -Continue Aricept  Essential hypertension Hypotension documented earlier in admission.  Lactic acidosis In setting of infection but unknown etiology. Resolved.  Hypokalemia Resolved with supplementation  Hypernatremia Secondary to decreased free water intake. Has resolved with increased oral intake  Anemia Acute blood loss Secondary to procedure. Received 1 unit of PRBC.  Thrombocytopenia Acute. Unknown etiology. Stable, so can follow-up outpatient.  Hypokalemia Supplementation given. Resolved.  Pressure injury Medial, bilateral buttock,  POA.    DVT prophylaxis: None in setting of thrombocytopenia and bilateral AKA Code Status:   Code Status: Full Code Family Communication: None at bedside Disposition Plan: Medically stable for discharge. Discharge pending TOC, insurance authorization   Consultants:   Orthopedic surgery  Procedures:   BILATERAL ABOVE KNEE AMPUTATIONS (06/03/2020)  Antimicrobials:  Vancomycin  Cefepime    Subjective: Thirsty. No issues.  Objective: Vitals:   06/08/20 1707 06/08/20 2223 06/09/20 0511 06/09/20 0939  BP: 120/79 137/88 125/68 130/79  Pulse: (!) 108 100 95 (!) 109  Resp: 18 18 18 18   Temp: 97.8 F (36.6 C) 98.4 F (36.9 C) 97.9 F (36.6 C) 97.8 F (36.6 C)  TempSrc: Oral Oral  Oral  SpO2: 98% 100% 100% 100%  Weight:      Height:        Intake/Output Summary (Last 24 hours) at 06/09/2020 1253 Last data filed at 06/09/2020 1000 Gross per 24 hour  Intake 707 ml  Output 500 ml  Net 207 ml   Filed Weights   05/31/20 2300 06/04/20 0511 06/05/20 2117  Weight: 63 kg 76.7 kg 76.2 kg    Examination:  General exam: Appears calm and comfortable Respiratory system: Clear to auscultation. Respiratory effort normal. Cardiovascular system: S1 & S2 heard, RRR. No murmurs, rubs, gallops or clicks. Gastrointestinal system: Abdomen is nondistended, soft and nontender. No organomegaly or masses felt. Normal bowel sounds heard. Central nervous system: Alert and oriented to person and place.  Musculoskeletal: Bilateral AKA. Skin: No cyanosis. No rashes   Data Reviewed: I have personally reviewed following labs and imaging studies  CBC Lab Results  Component Value Date   WBC 9.1 06/08/2020   RBC 3.59 (L) 06/08/2020   HGB 9.7 (L) 06/08/2020   HCT 31.8 (L) 06/08/2020   MCV 88.6 06/08/2020  MCH 27.0 06/08/2020   PLT 78 (L) 06/08/2020   MCHC 30.5 06/08/2020   RDW 17.7 (H) 06/08/2020   LYMPHSABS 1.9 05/31/2020   MONOABS 0.8 05/31/2020   EOSABS 0.1 05/31/2020   BASOSABS  0.0 05/31/2020     Last metabolic panel Lab Results  Component Value Date   NA 143 06/08/2020   K 4.1 06/08/2020   CL 117 (H) 06/08/2020   CO2 24 06/08/2020   BUN 8 06/08/2020   CREATININE 0.40 (L) 06/08/2020   GLUCOSE 71 06/08/2020   GFRNONAA >60 06/08/2020   GFRAA >60 06/08/2020   CALCIUM 7.9 (L) 06/08/2020   PHOS 2.8 06/04/2020   PROT 5.4 (L) 06/04/2020   ALBUMIN 1.3 (L) 06/04/2020   BILITOT 0.4 06/04/2020   ALKPHOS 82 06/04/2020   AST 13 (L) 06/04/2020   ALT 10 06/04/2020   ANIONGAP 7 06/08/2020    CBG (last 3)  No results for input(s): GLUCAP in the last 72 hours.   GFR: Estimated Creatinine Clearance: 66.3 mL/min (A) (by C-G formula based on SCr of 0.4 mg/dL (L)).  Coagulation Profile: No results for input(s): INR, PROTIME in the last 168 hours.  Recent Results (from the past 240 hour(s))  SARS Coronavirus 2 by RT PCR (hospital order, performed in [facility] hospital lab) Nasopharyngeal Nasopharyngeal Swab     Status: None   Collection Time: 05/31/20 11:16 PM   Specimen: Nasopharyngeal Swab  Result Value Ref Range Status   SARS Coronavirus 2 NEGATIVE NEGATIVE Final    Comment: (NOTE) SARS-CoV-2 target nucleic acids are NOT DETECTED.  The SARS-CoV-2 RNA is generally detectable in upper and lower respiratory specimens during the acute phase of infection. The lowest concentration of SARS-CoV-2 viral copies this assay can detect is 250 copies / mL. A negative result does not preclude SARS-CoV-2 infection and should not be used as the sole basis for treatment or other patient management decisions.  A negative result may occur with improper specimen collection / handling, submission of specimen other than nasopharyngeal swab, presence of viral mutation(s) within the areas targeted by this assay, and inadequate number of viral copies (<250 copies / mL). A negative result must be combined with clinical observations, patient history, and epidemiological  information.  Fact Sheet for Patients:   BoilerBrush.com.cy  Fact Sheet for Healthcare Providers: https://pope.com/  This test is not yet approved or  cleared by the [place] FDA and has been authorized for detection and/or diagnosis of SARS-CoV-2 by FDA under an Emergency Use Authorization (EUA).  This EUA will remain in effect (meaning this test can be used) for the duration of the COVID-19 declaration under Section 564(b)(1) of the Act, 21 U.S.C. section 360bbb-3(b)(1), unless the authorization is terminated or revoked sooner.  Performed at [facility], [address], [place], [place] [address]         Radiology Studies: No results found.      Scheduled Meds: . sodium chloride   Intravenous Once  . docusate sodium  100 mg Oral BID  . feeding supplement (ENSURE ENLIVE)  237 mL Oral BID BM  . mirtazapine  7.5 mg Oral QHS  . pantoprazole  40 mg Oral Daily  . potassium chloride  40 mEq Oral Daily  . sodium chloride flush  3 mL Intravenous Once   Continuous Infusions:   LOS: 9 days     Jacquelin Hawking, MD [facility] 06/09/2020, 12:53 PM  If 7PM-7AM, please contact night-coverage [email or web address]

## 2020-06-09 NOTE — TOC Progression Note (Signed)
Transition of Care [facility]) - Progression Note    Patient Details  Name: DWIGHT BURDO MRN: [id number] Date of Birth: 10/04/1946  Transition of Care [facility]) CM/SW Contact  Okey Dupre Lazaro Arms, LCSW Phone Number: 06/09/2020, 1:37 PM  Clinical Narrative:  CSW continuing to work on SNF placement. Follow-up call made to [facility] and was informed that they do not manage patient. Contacted Melissa, admissions director at [facility] and informed her that [facility] does not manage patient. Facility will initiate auth, however updated PT/OT notes needed. Call made to PT/OT office and CSW advised that new PT/OT orders needed. MD contacted and advised.  Visited room and talked with daughter Frederich Chick and updated her regarding contact with facility and request made for patient to be seen by OT/PT as updated clinicals needed for insurance auth.   Expected Discharge Plan: Skilled Nursing Facility Barriers to Discharge: Insurance Authorization  Expected Discharge Plan and Services Expected Discharge Plan: Skilled Nursing Facility In-house Referral: Clinical Social Work                                           Social Determinants of Health (SDOH) Interventions  No SDOH interventions requested or needed at this time.  Readmission Risk Interventions No flowsheet data found.

## 2020-06-10 NOTE — Progress Notes (Signed)
PROGRESS NOTE    Tracy Armstrong  ATF:[id number] DOB: 11-01-1946 DOA: 05/31/2020 PCP: [name], MD   Brief Narrative: Tracy Armstrong is a 74 y.o. female with past medical history of multiple sclerosis, dementia, hypertension and chronic bedbound status. Patient presented secondary to worsening lower extremity wounds leading to AKA to treat gangrene and osteomyelitis.   Assessment & Plan:   Principal Problem:   Gangrene (HCC) Active Problems:   Sepsis (HCC)   Unstageable pressure injury of skin and tissue (HCC)   Dementia without behavioral disturbance (HCC)   HTN (hypertension)   Lactic acidosis   Palliative care by specialist   Cellulitis of left foot   Bilateral LE gangrene and osteomyelitis Severe sepsis Sepsis present on admission with tachypnea, tachycardia and lactic acidosis. Patient initially managed on Vancomycin, Cefepime and Flagyl. Orthopedic surgery consulted and performed bilateral AKA with wound vac placement on 7/30 and removal on 8/6. Sepsis physiology resolved. -Orthopedic surgery recommendations: up with therapy  Rectal bleed One occurrence per chart review. Hemoglobin stable.  History of multiple sclerosis Bed bound as an outpatient. On hospice care per chart review. Will need therapy to increase transfer ability/improve functional capacity to be as independent as possible as an outpatient.  Dementia -Continue Aricept  Essential hypertension Hypotension documented earlier in admission.  Lactic acidosis In setting of infection but unknown etiology. Resolved.  Hypokalemia Resolved with supplementation  Hypernatremia Secondary to decreased free water intake. Has resolved with increased oral intake  Anemia Acute blood loss Secondary to procedure. Received 1 unit of PRBC.  Thrombocytopenia Acute. Unknown etiology. Stable, so can follow-up outpatient.  Hypokalemia Supplementation given. Resolved.  Pressure injury Medial, bilateral  buttock, POA.    DVT prophylaxis: None in setting of thrombocytopenia and bilateral AKA Code Status:   Code Status: Full Code Family Communication: None at bedside Disposition Plan: Medically stable for discharge. Discharge pending TOC, insurance authorization   Consultants:   Orthopedic surgery  Procedures:   BILATERAL ABOVE KNEE AMPUTATIONS (06/03/2020)  Antimicrobials:  Vancomycin  Cefepime    Subjective: No concerns  Objective: Vitals:   06/09/20 2010 06/09/20 2103 06/10/20 0503 06/10/20 0900  BP:  111/69 117/72 113/71  Pulse: 98 100 (!) 104 (!) 109  Resp: 18 15 17 16   Temp:  98.4 F (36.9 C) 98.4 F (36.9 C) 98.3 F (36.8 C)  TempSrc:    Oral  SpO2:  98% 99% 100%  Weight:  76.2 kg    Height:        Intake/Output Summary (Last 24 hours) at 06/10/2020 1201 Last data filed at 06/10/2020 0748 Gross per 24 hour  Intake 180 ml  Output 125 ml  Net 55 ml   Filed Weights   06/04/20 0511 06/05/20 2117 06/09/20 2103  Weight: 76.7 kg 76.2 kg 76.2 kg    Examination:  General exam: Appears calm and comfortable Respiratory system: Clear to auscultation. Respiratory effort normal. Cardiovascular system: S1 & S2 heard, RRR. No murmurs, rubs, gallops or clicks. Gastrointestinal system: Abdomen is nondistended, soft and nontender. No organomegaly or masses felt. Normal bowel sounds heard. Central nervous system: Alert and oriented to person and place. Musculoskeletal: Bilateral AKA with dressing intact, clean and dry Skin: No cyanosis. No rashes   Data Reviewed: I have personally reviewed following labs and imaging studies  CBC Lab Results  Component Value Date   WBC 9.1 06/08/2020   RBC 3.59 (L) 06/08/2020   HGB 9.7 (L) 06/08/2020   HCT 31.8 (L) 06/08/2020  MCV 88.6 06/08/2020   MCH 27.0 06/08/2020   PLT 78 (L) 06/08/2020   MCHC 30.5 06/08/2020   RDW 17.7 (H) 06/08/2020   LYMPHSABS 1.9 05/31/2020   MONOABS 0.8 05/31/2020   EOSABS 0.1 05/31/2020    BASOSABS 0.0 05/31/2020     Last metabolic panel Lab Results  Component Value Date   NA 143 06/08/2020   K 4.1 06/08/2020   CL 117 (H) 06/08/2020   CO2 24 06/08/2020   BUN 8 06/08/2020   CREATININE 0.40 (L) 06/08/2020   GLUCOSE 71 06/08/2020   GFRNONAA >60 06/08/2020   GFRAA >60 06/08/2020   CALCIUM 7.9 (L) 06/08/2020   PHOS 2.8 06/04/2020   PROT 5.4 (L) 06/04/2020   ALBUMIN 1.3 (L) 06/04/2020   BILITOT 0.4 06/04/2020   ALKPHOS 82 06/04/2020   AST 13 (L) 06/04/2020   ALT 10 06/04/2020   ANIONGAP 7 06/08/2020    CBG (last 3)  No results for input(s): GLUCAP in the last 72 hours.   GFR: Estimated Creatinine Clearance: 66.3 mL/min (A) (by C-G formula based on SCr of 0.4 mg/dL (L)).  Coagulation Profile: No results for input(s): INR, PROTIME in the last 168 hours.  Recent Results (from the past 240 hour(s))  SARS Coronavirus 2 by RT PCR (hospital order, performed in [facility] hospital lab) Nasopharyngeal Nasopharyngeal Swab     Status: None   Collection Time: 05/31/20 11:16 PM   Specimen: Nasopharyngeal Swab  Result Value Ref Range Status   SARS Coronavirus 2 NEGATIVE NEGATIVE Final    Comment: (NOTE) SARS-CoV-2 target nucleic acids are NOT DETECTED.  The SARS-CoV-2 RNA is generally detectable in upper and lower respiratory specimens during the acute phase of infection. The lowest concentration of SARS-CoV-2 viral copies this assay can detect is 250 copies / mL. A negative result does not preclude SARS-CoV-2 infection and should not be used as the sole basis for treatment or other patient management decisions.  A negative result may occur with improper specimen collection / handling, submission of specimen other than nasopharyngeal swab, presence of viral mutation(s) within the areas targeted by this assay, and inadequate number of viral copies (<250 copies / mL). A negative result must be combined with clinical observations, patient history, and epidemiological  information.  Fact Sheet for Patients:   BoilerBrush.com.cy  Fact Sheet for Healthcare Providers: https://pope.com/  This test is not yet approved or  cleared by the [place] FDA and has been authorized for detection and/or diagnosis of SARS-CoV-2 by FDA under an Emergency Use Authorization (EUA).  This EUA will remain in effect (meaning this test can be used) for the duration of the COVID-19 declaration under Section 564(b)(1) of the Act, 21 U.S.C. section 360bbb-3(b)(1), unless the authorization is terminated or revoked sooner.  Performed at [facility], [address] [place], [place] [address]         Radiology Studies: No results found.      Scheduled Meds: . sodium chloride   Intravenous Once  . docusate sodium  100 mg Oral BID  . feeding supplement (ENSURE ENLIVE)  237 mL Oral BID BM  . mirtazapine  7.5 mg Oral QHS  . pantoprazole  40 mg Oral Daily  . potassium chloride  40 mEq Oral Daily  . sodium chloride flush  3 mL Intravenous Once   Continuous Infusions:   LOS: 10 days     Jacquelin Hawking, MD [facility] 06/10/2020, 12:01 PM  If 7PM-7AM, please contact night-coverage [email or web address]

## 2020-06-10 NOTE — Clinical Social Work Note (Signed)
Patient will discharge to [facility] once insurance authorization is received. Additional PT/OT notes sent to facility this afternoon. CSW will continue to follow and provide SW intervention services through discharge.  Genelle Bal, MSW, LCSW Licensed Clinical Social Worker Clinical Social Work Department [employer] [phone]

## 2020-06-10 NOTE — Evaluation (Signed)
Clinical/Bedside Swallow Evaluation Patient Details  Name: Tracy Armstrong MRN: [id number] Date of Birth: 08-May-1946  Today's Date: 06/10/2020 Time: SLP Start Time (ACUTE ONLY): 0906 SLP Stop Time (ACUTE ONLY): 0921 SLP Time Calculation (min) (ACUTE ONLY): 15 min  Past Medical History:  Past Medical History:  Diagnosis Date  . Multiple sclerosis (HCC) 2009   Past Surgical History:  Past Surgical History:  Procedure Laterality Date  . AMPUTATION Bilateral 06/03/2020   Procedure: BILATERAL ABOVE KNEE AMPUTATIONS;  Surgeon: Tracy Mustard, MD;  Location: [facility] OR;  Service: Orthopedics;  Laterality: Bilateral;  . CHOLECYSTECTOMY     HPI:  Pt is a 74 y/o female with past medical history of multiple sclerosis, dementia, hypertension and chronic bedbound status who presented secondary to worsening lower extremity wounds leading to AKA to treat gangrene and osteomyelitis. CXR 7/27 negative for active disease. RN indicated that the pt's p.o. intake has been limited and indicated that SLP was likely consulted for this reason.   Assessment / Plan / Recommendation Clinical Impression  Pt was seen for bedside swallow evaluation. She has a diagnosis of dementia and exhibited difficulty  following commands or responding appropriately to questions. Oral mechanism exam was limited due to pt's difficulty following commands; however, oral motor strength and ROM appeared grossly WFL. Pt was edentulous and indicated that she wears dentures which are at the hospital. A denture toothbrush was found in the pt's belonging but no dentures were found in her room. It is noteworthy that the pt reported that she will not need them because she has no intentions of eating. Pt exhibited difficulty with mastication of dysphagia 3 solids and ultimately spat them out. Throat clearing was inconsistently observed with consecutive swallows of thin liquids via straw, suggesting possible aspiration. It is recommended that he diet be  modified to dysphagia 2 solids with thin liquids. SLP will follow to assess diet tolerance.  SLP Visit Diagnosis: Dysphagia, unspecified (R13.10)    Aspiration Risk  Mild aspiration risk    Diet Recommendation Dysphagia 2 (Fine chop);Thin liquid   Liquid Administration via: Cup;Straw Medication Administration: Crushed with puree Supervision: Staff to assist with self feeding Compensations: Slow rate;Small sips/bites Postural Changes: Seated upright at 90 degrees    Other  Recommendations Oral Care Recommendations: Oral care BID   Follow up Recommendations None      Frequency and Duration min 2x/week  2 weeks       Prognosis Prognosis for Safe Diet Advancement: Fair Barriers to Reach Goals: Cognitive deficits      Swallow Study   General Date of Onset: 06/09/20 HPI: Pt is a 74 y/o female with past medical history of multiple sclerosis, dementia, hypertension and chronic bedbound status who presented secondary to worsening lower extremity wounds leading to AKA to treat gangrene and osteomyelitis. CXR 7/27 negative for active disease. RN indicated that the pt's p.o. intake has been limited and indicated that SLP was likely consulted for this reason. Type of Study: Bedside Swallow Evaluation Previous Swallow Assessment: none Diet Prior to this Study: Dysphagia 3 (soft);Thin liquids Temperature Spikes Noted: No Respiratory Status: Room air History of Recent Intubation: No Behavior/Cognition: Alert;Cooperative;Pleasant mood Oral Cavity Assessment: Within Functional Limits Oral Care Completed by SLP: No Oral Cavity - Dentition: Edentulous Vision: Functional for self-feeding Self-Feeding Abilities: Needs assist Patient Positioning: Upright in bed;Postural control adequate for testing Baseline Vocal Quality: Normal Volitional Cough: Cognitively unable to elicit Volitional Swallow: Able to elicit    Oral/Motor/Sensory Function Overall Oral Motor/Sensory  Function: Within  functional limits   Ice Chips Ice chips: Within functional limits Presentation: Spoon   Thin Liquid Thin Liquid: Impaired Pharyngeal  Phase Impairments: Throat Clearing - Delayed (Inconsistently following consecutive swallows)    Nectar Thick Nectar Thick Liquid: Not tested   Honey Thick Honey Thick Liquid: Not tested   Puree Puree: Within functional limits Presentation: Spoon   Solid     Solid: Impaired Presentation: Spoon Oral Phase Impairments: Impaired mastication     Tracy Armstrong I. Tracy Clock, MS, CCC-SLP Acute Rehabilitation Services Office number [phone] Pager [phone]  Tracy Armstrong 06/10/2020,9:30 AM

## 2020-06-10 NOTE — Evaluation (Signed)
Occupational Therapy Evaluation Patient Details Name: Tracy Armstrong MRN: [id number] DOB: 03-15-1946 Today's Date: 06/10/2020    History of Present Illness Pt is a 74 y/o female s/p bilateral transfemoral amputations secondary to worsening gangrenous ulcers of both feet. PMH including but not limited to MS. Pt underwent bilateral AKA on 06/03/2020   Clinical Impression   Pt received s/p B AKA with new OT orders for assessment of appropriateness of SNF placement. At baseline, pt able to feed self and receives Total A for all other ADLs. Pt with B UE contractures with hard end feel and ROM same as previous evaluations. Pt with pain throughout ROM assessment and skin integrity concerns, so do not recommend splinting at this time. Pt unable to reach to tray table, but pt able to demonstrate ability to drink from cup once placed in pt's hands. Pt Total A for rolling, unable to sit EOB without 2 person assist. Pt at baseline for ADLs and no skilled OT services indicated at acute level. Recommend DC to SNF with consideration of LTC. Defer OT needs to next venue of care.    Follow Up Recommendations  SNF;Supervision/Assistance - 24 hour    Equipment Recommendations  None recommended by OT    Recommendations for Other Services       Precautions / Restrictions Precautions Precautions: Other (comment);Fall Precaution Comments: bilateral AKA's with wound VACs Restrictions Weight Bearing Restrictions: Yes RLE Weight Bearing: Non weight bearing LLE Weight Bearing: Non weight bearing      Mobility Bed Mobility Overal bed mobility: Needs Assistance Bed Mobility: Rolling Rolling: Total assist         General bed mobility comments: Total A for rolling side to side in bed, reports back pain when rolling. Pt declined to attempt sitting EOB today  Transfers Overall transfer level: Needs assistance               General transfer comment: per chart review, family provided Total A for  transfers bed <> wheelchair. Pt reports fearful of lift and reports increased back pain when lift was used    Balance                                           ADL either performed or assessed with clinical judgement   ADL Overall ADL's : Needs assistance/impaired;At baseline Eating/Feeding: Supervision/ safety;Bed level Eating/Feeding Details (indicate cue type and reason): Supervision for drinking from cup. Able to hold and bring to mouth once placed in hand due to inability to reach tray with contracted B UE Grooming: Maximal assistance;Bed level   Upper Body Bathing: Total assistance;Bed level   Lower Body Bathing: Total assistance;Bed level   Upper Body Dressing : Total assistance;Bed level   Lower Body Dressing: Total assistance;Bed level       Toileting- Clothing Manipulation and Hygiene: Total assistance;Bed level         General ADL Comments: Pt able to demo ability to drink from cup once placed in hands (able to self feed at home) and has capabilities to engage in basic grooming tasks bed level. Total A for all other ADLs     Vision Baseline Vision/History: No visual deficits Patient Visual Report: No change from baseline Vision Assessment?: No apparent visual deficits     Perception     Praxis      Pertinent Vitals/Pain Pain Assessment: Faces Faces Pain  Scale: Hurts even more Pain Location: with B UE ROM Pain Descriptors / Indicators: Grimacing Pain Intervention(s): Monitored during session;Limited activity within patient's tolerance     Hand Dominance Left   Extremity/Trunk Assessment Upper Extremity Assessment Upper Extremity Assessment: RUE deficits/detail;LUE deficits/detail RUE Deficits / Details: <15 degrees PROM shoulder flexion, 90 degree elbow extension passively. Wrist flexion 25 degrees. MCPs <20 degrees flexion. Weak grasp. hard end feel with ROM RUE Coordination: decreased fine motor;decreased gross motor LUE Deficits /  Details: L UE with more ROM than R UE, but still contracted with hard end feel. < 30 degree shoulder flexion passively, Elbow extension 125 degrees. Wrist flexion WFL and improved ability to make fist, but weak grasp. 4th and 5th digit flexion contracture LUE Coordination: decreased fine motor;decreased gross motor   Lower Extremity Assessment Lower Extremity Assessment: Defer to PT evaluation   Cervical / Trunk Assessment Cervical / Trunk Assessment: Kyphotic   Communication Communication Communication: Expressive difficulties   Cognition Arousal/Alertness: Awake/alert Behavior During Therapy: Flat affect Overall Cognitive Status: History of cognitive impairments - at baseline                                 General Comments: Dementia at baseline. Pt able to follow one step commands. A&Ox1   General Comments  VSS on RA. Pt reporting pain during ROM of B UE. Hard end feel. Do not recommend splinting at this time as cons outweigh therapeutic benefits (pain, skin integrity concerns, baseline for ADLs)     Exercises     Shoulder Instructions      Home Living Family/patient expects to be discharged to:: Private residence Living Arrangements: Children Available Help at Discharge: Family;Available 24 hours/day Type of Home: House Home Access: Ramped entrance     Home Layout: One level               Home Equipment: Wheelchair - manual;Hospital bed   Additional Comments: all information taken from previous admission due to pt inability to report      Prior Functioning/Environment Level of Independence: Needs assistance  Gait / Transfers Assistance Needed: total assist for bed mobility, transfer to [facility] ADL's / Homemaking Assistance Needed: pt able to feed herself at baseline per previous admission, otherwise total care            OT Problem List: Decreased strength;Decreased range of motion;Impaired balance (sitting and/or standing);Decreased  coordination;Decreased cognition;Impaired UE functional use;Impaired tone;Decreased activity tolerance;Pain      OT Treatment/Interventions:      OT Goals(Current goals can be found in the care plan section) Acute Rehab OT Goals Patient Stated Goal: did not state. Per chart review, family would like SNF placement   OT Frequency:     Barriers to D/C:            Co-evaluation              AM-PAC OT "6 Clicks" Daily Activity     Outcome Measure Help from another person eating meals?: A Little Help from another person taking care of personal grooming?: A Lot Help from another person toileting, which includes using toliet, bedpan, or urinal?: Total Help from another person bathing (including washing, rinsing, drying)?: Total Help from another person to put on and taking off regular upper body clothing?: Total Help from another person to put on and taking off regular lower body clothing?: Total 6 Click Score: 9   End  of Session Nurse Communication: Mobility status  Activity Tolerance: Other (comment);Patient limited by pain (limited by cognition, pain when moving) Patient left: in bed;with call bell/phone within reach  OT Visit Diagnosis: Other abnormalities of gait and mobility (R26.89);Muscle weakness (generalized) (M62.81);Other symptoms and signs involving cognitive function                Time: 2897-9150 OT Time Calculation (min): 15 min Charges:  OT General Charges $OT Visit: 1 Visit OT Evaluation $OT Eval Moderate Complexity: 1 Mod  Lorre Munroe, OTR/L  Lorre Munroe 06/10/2020, 10:28 AM

## 2020-06-10 NOTE — Progress Notes (Signed)
The patient is a 74 year old woman who is now 1 week status post bilateral above-knee amputations by Dr. [name].  She has not had any drainage of the wound vacs.  They have been functioning.   Wound vacs were removed today without difficulty.  Both incisions were well approximated no necrosis minimal drainage no cellulitis mild odor from her right side but the site also seem to have some moisture that it got beneath the VAC.  No purulent drainage.  Dry dressing was applied..  Patient will need 1 week follow-up after she is discharged from the hospital in our office we will order bilateral dressing changes daily

## 2020-06-11 NOTE — Progress Notes (Signed)
PROGRESS NOTE    Tracy Armstrong  RDE:[id number] DOB: Aug 08, 1946 DOA: 05/31/2020 PCP: [name], MD   Brief Narrative: Tracy Armstrong is a 74 y.o. female with past medical history of multiple sclerosis, dementia, hypertension and chronic bedbound status. Patient presented secondary to worsening lower extremity wounds leading to AKA to treat gangrene and osteomyelitis.   Assessment & Plan:   Principal Problem:   Gangrene (HCC) Active Problems:   Sepsis (HCC)   Unstageable pressure injury of skin and tissue (HCC)   Dementia without behavioral disturbance (HCC)   HTN (hypertension)   Lactic acidosis   Palliative care by specialist   Cellulitis of left foot   Bilateral LE gangrene and osteomyelitis Severe sepsis Sepsis present on admission with tachypnea, tachycardia and lactic acidosis. Patient initially managed on Vancomycin, Cefepime and Flagyl. Orthopedic surgery consulted and performed bilateral AKA with wound vac placement on 7/30 and removal on 8/6. Sepsis physiology resolved. -Orthopedic surgery recommendations: up with therapy  Rectal bleed One occurrence per chart review. Hemoglobin stable.  History of multiple sclerosis Bed bound as an outpatient. On hospice care per chart review. Will need therapy to increase transfer ability/improve functional capacity to be as independent as possible as an outpatient.  Dementia -Continue Aricept  Essential hypertension Hypotension documented earlier in admission.  Lactic acidosis In setting of infection but unknown etiology. Resolved.  Hypokalemia Resolved with supplementation  Hypernatremia Secondary to decreased free water intake. Has resolved with increased oral intake  Anemia Acute blood loss Secondary to procedure. Received 1 unit of PRBC.  Thrombocytopenia Acute. Unknown etiology. Stable, so can follow-up outpatient.  Hypokalemia Supplementation given. Resolved.  Pressure injury Medial, bilateral  buttock, POA.    DVT prophylaxis: None in setting of thrombocytopenia and bilateral AKA Code Status:   Code Status: Full Code Family Communication: None at bedside Disposition Plan: Medically stable for discharge. Discharge pending TOC, insurance authorization   Consultants:   Orthopedic surgery  Procedures:   BILATERAL ABOVE KNEE AMPUTATIONS (06/03/2020)  Antimicrobials:  Vancomycin  Cefepime    Subjective: No issues  Objective: Vitals:   06/10/20 1606 06/10/20 2127 06/11/20 0509 06/11/20 0900  BP: 115/62 101/71 125/73 102/71  Pulse: (!) 103 (!) 102 (!) 110 (!) 101  Resp: 18 20 16 18   Temp: 98 F (36.7 C) 98.5 F (36.9 C) 98.4 F (36.9 C) 98 F (36.7 C)  TempSrc: Oral Oral Oral Oral  SpO2: 98% 99% 100% 99%  Weight:      Height:        Intake/Output Summary (Last 24 hours) at 06/11/2020 1343 Last data filed at 06/11/2020 0900 Gross per 24 hour  Intake 30 ml  Output 575 ml  Net -545 ml   Filed Weights   06/04/20 0511 06/05/20 2117 06/09/20 2103  Weight: 76.7 kg 76.2 kg 76.2 kg    Examination:  General exam: Appears calm and comfortable Respiratory system: Mild rales. Respiratory effort normal. Cardiovascular system: S1 & S2 heard, RRR. No murmurs, rubs, gallops or clicks. Gastrointestinal system: Abdomen is nondistended, soft and nontender. Normal bowel sounds heard. Central nervous system: Alert. Musculoskeletal: No edema. No calf tenderness Skin: No cyanosis. No rashes   Data Reviewed: I have personally reviewed following labs and imaging studies  CBC Lab Results  Component Value Date   WBC 9.1 06/08/2020   RBC 3.59 (L) 06/08/2020   HGB 9.7 (L) 06/08/2020   HCT 31.8 (L) 06/08/2020   MCV 88.6 06/08/2020   MCH 27.0 06/08/2020  PLT 78 (L) 06/08/2020   MCHC 30.5 06/08/2020   RDW 17.7 (H) 06/08/2020   LYMPHSABS 1.9 05/31/2020   MONOABS 0.8 05/31/2020   EOSABS 0.1 05/31/2020   BASOSABS 0.0 05/31/2020     Last metabolic panel Lab Results   Component Value Date   NA 143 06/08/2020   K 4.1 06/08/2020   CL 117 (H) 06/08/2020   CO2 24 06/08/2020   BUN 8 06/08/2020   CREATININE 0.40 (L) 06/08/2020   GLUCOSE 71 06/08/2020   GFRNONAA >60 06/08/2020   GFRAA >60 06/08/2020   CALCIUM 7.9 (L) 06/08/2020   PHOS 2.8 06/04/2020   PROT 5.4 (L) 06/04/2020   ALBUMIN 1.3 (L) 06/04/2020   BILITOT 0.4 06/04/2020   ALKPHOS 82 06/04/2020   AST 13 (L) 06/04/2020   ALT 10 06/04/2020   ANIONGAP 7 06/08/2020    CBG (last 3)  No results for input(s): GLUCAP in the last 72 hours.   GFR: Estimated Creatinine Clearance: 66.3 mL/min (A) (by C-G formula based on SCr of 0.4 mg/dL (L)).  Coagulation Profile: No results for input(s): INR, PROTIME in the last 168 hours.  No results found for this or any previous visit (from the past 240 hour(s)).      Radiology Studies: No results found.      Scheduled Meds: . sodium chloride   Intravenous Once  . docusate sodium  100 mg Oral BID  . feeding supplement (ENSURE ENLIVE)  237 mL Oral BID BM  . mirtazapine  7.5 mg Oral QHS  . pantoprazole  40 mg Oral Daily  . potassium chloride  40 mEq Oral Daily  . sodium chloride flush  3 mL Intravenous Once   Continuous Infusions:   LOS: 11 days     Jacquelin Hawking, MD [facility] 06/11/2020, 1:43 PM  If 7PM-7AM, please contact night-coverage [email or web address]

## 2020-06-12 LAB — CBC
HCT: 27 % — ABNORMAL LOW (ref 36.0–46.0)
Hemoglobin: 8.3 g/dL — ABNORMAL LOW (ref 12.0–15.0)
MCH: 27.9 pg (ref 26.0–34.0)
MCHC: 30.7 g/dL (ref 30.0–36.0)
MCV: 90.6 fL (ref 80.0–100.0)
Platelets: 205 10*3/uL (ref 150–400)
RBC: 2.98 MIL/uL — ABNORMAL LOW (ref 3.87–5.11)
RDW: 18.2 % — ABNORMAL HIGH (ref 11.5–15.5)
WBC: 10.7 10*3/uL — ABNORMAL HIGH (ref 4.0–10.5)
nRBC: 0 % (ref 0.0–0.2)

## 2020-06-12 LAB — BASIC METABOLIC PANEL
Anion gap: 11 (ref 5–15)
BUN: 9 mg/dL (ref 8–23)
CO2: 23 mmol/L (ref 22–32)
Calcium: 7.9 mg/dL — ABNORMAL LOW (ref 8.9–10.3)
Chloride: 109 mmol/L (ref 98–111)
Creatinine, Ser: 0.45 mg/dL (ref 0.44–1.00)
GFR calc Af Amer: >60 mL/min (ref >60)
GFR calc non Af Amer: >60 mL/min (ref >60)
Glucose, Bld: 75 mg/dL (ref 70–99)
Potassium: 3.7 mmol/L (ref 3.5–5.1)
Sodium: 143 mmol/L (ref 135–145)

## 2020-06-12 NOTE — Progress Notes (Signed)
  Speech Language Pathology Treatment: Dysphagia  Patient Details Name: Tracy Armstrong MRN: [id number] DOB: 12/22/1945 Today's Date: 06/12/2020 Time: 7829-5621 SLP Time Calculation (min) (ACUTE ONLY): 13 min  Assessment / Plan / Recommendation Clinical Impression  Pt was seen for dysphagia treatment. [name], RN indicated that the pt has been holding food in her mouth and has not been eating. Per pt's medical record, pt's family had decided on comfort care during the July admission and it appears that palliative care has been consulted for determination of current GOC. Pt was alert throughout the evaluation, but verbal output was limited. She indicated that she has not wanted to eat anything but she was unsure as to why. No s/sx of aspiration were noted during this session. However, she exhibited oral holding, mastication was absent, and she ultimately spat out dysphagia 2 solids which required mastication. Pt consistently demonstrated lingual protrusion when puree boluses were presented via spoon and the size of the boluses accepted were therefore very small. Pt's diet will be modified to dysphagia 1 considering absence of mastication; however, it is likely that her p.o. intake will continue to be limited. SLP will continue to follow briefly.    HPI HPI: Pt is a 75 y/o female with past medical history of multiple sclerosis, dementia, hypertension and chronic bedbound status who presented secondary to worsening lower extremity wounds leading to AKA to treat gangrene and osteomyelitis. CXR 7/27 negative for active disease. RN indicated that the pt's p.o. intake has been limited and indicated that SLP was likely consulted for this reason.      SLP Plan  Continue with current plan of care       Recommendations  Diet recommendations: Dysphagia 1 (puree);Thin liquid Liquids provided via: Straw;Cup Medication Administration: Crushed with puree Supervision: Staff to assist with self feeding;Full  supervision/cueing for compensatory strategies Compensations: Slow rate;Small sips/bites Postural Changes and/or Swallow Maneuvers: Seated upright 90 degrees;Upright 30-60 min after meal                Oral Care Recommendations: Oral care BID Follow up Recommendations: None SLP Visit Diagnosis: Dysphagia, unspecified (R13.10) Plan: Continue with current plan of care       Tracy Armstrong I. Tracy Clock, MS, CCC-SLP Acute Rehabilitation Services Office number [phone] Pager [phone]                 Tracy Armstrong 06/12/2020, 4:49 PM

## 2020-06-12 NOTE — Progress Notes (Signed)
PROGRESS NOTE    Tracy Armstrong  DVV:[id number] DOB: 21-Jul-1946 DOA: 05/31/2020 PCP: [name], MD   Brief Narrative: Tracy Armstrong is a 74 y.o. female with past medical history of multiple sclerosis, dementia, hypertension and chronic bedbound status. Patient presented secondary to worsening lower extremity wounds leading to AKA to treat gangrene and osteomyelitis.   Assessment & Plan:   Principal Problem:   Gangrene (HCC) Active Problems:   Sepsis (HCC)   Unstageable pressure injury of skin and tissue (HCC)   Dementia without behavioral disturbance (HCC)   HTN (hypertension)   Lactic acidosis   Palliative care by specialist   Cellulitis of left foot   Bilateral LE gangrene and osteomyelitis Severe sepsis Sepsis present on admission with tachypnea, tachycardia and lactic acidosis. Patient initially managed on Vancomycin, Cefepime and Flagyl. Orthopedic surgery consulted and performed bilateral AKA with wound vac placement on 7/30 and removal on 8/6. Sepsis physiology resolved. -Orthopedic surgery recommendations: up with therapy  Rectal bleed One occurrence per chart review. Hemoglobin stable.  History of multiple sclerosis Bed bound as an outpatient. On hospice care per chart review. Will need therapy to increase transfer ability/improve functional capacity to be as independent as possible as an outpatient.  Dementia -Continue Aricept  Essential hypertension Hypotension documented earlier in admission.  Lactic acidosis In setting of infection but unknown etiology. Resolved.  Hypokalemia Resolved with supplementation  Hypernatremia Secondary to decreased free water intake. Has resolved with increased oral intake  Anemia Acute blood loss Secondary to procedure. Received 1 unit of PRBC.  Thrombocytopenia Acute. Unknown etiology. Stable, so can follow-up outpatient.  Hypokalemia Supplementation given. Resolved.  Pressure injury Medial, bilateral  buttock, POA.    DVT prophylaxis: None in setting of thrombocytopenia and bilateral AKA Code Status:   Code Status: Full Code Family Communication: None at bedside Disposition Plan: Medically stable for discharge. Discharge pending TOC, insurance authorization   Consultants:   Orthopedic surgery  Procedures:   BILATERAL ABOVE KNEE AMPUTATIONS (06/03/2020)  Antimicrobials:  Vancomycin  Cefepime    Subjective: No concerns today. Overnight, patient had some sinus tachycardia of unknown significance.  Objective: Vitals:   06/12/20 0020 06/12/20 0213 06/12/20 0627 06/12/20 0900  BP: 100/63 101/63 101/64 102/65  Pulse: (!) 121 (!) 126 (!) 117 (!) 101  Resp: 14  15 14   Temp: 99.5 F (37.5 C) 99.4 F (37.4 C) 98.7 F (37.1 C) 98.6 F (37 C)  TempSrc: Oral Oral Oral Oral  SpO2: 96% 98% 97% 98%  Weight:      Height:        Intake/Output Summary (Last 24 hours) at 06/12/2020 1320 Last data filed at 06/12/2020 0900 Gross per 24 hour  Intake 30 ml  Output 400 ml  Net -370 ml   Filed Weights   06/05/20 2117 06/09/20 2103 06/11/20 2157  Weight: 76.2 kg 76.2 kg 76.7 kg    Examination:  General exam: Appears calm and comfortable Respiratory system: Clear to auscultation. Respiratory effort normal. Cardiovascular system: S1 & S2 heard, RRR. No murmurs, rubs, gallops or clicks. Gastrointestinal system: Abdomen is nondistended, soft and nontender. No organomegaly or masses felt. Normal bowel sounds heard. Central nervous system: Alert and oriented. No focal neurological deficits. Musculoskeletal: B/L AKA. Skin: No cyanosis. No rashes Psychiatry: Judgement and insight appear normal. Mood & affect appropriate.    Data Reviewed: I have personally reviewed following labs and imaging studies  CBC Lab Results  Component Value Date   WBC 9.1 06/08/2020  RBC 3.59 (L) 06/08/2020   HGB 9.7 (L) 06/08/2020   HCT 31.8 (L) 06/08/2020   MCV 88.6 06/08/2020   MCH 27.0  06/08/2020   PLT 78 (L) 06/08/2020   MCHC 30.5 06/08/2020   RDW 17.7 (H) 06/08/2020   LYMPHSABS 1.9 05/31/2020   MONOABS 0.8 05/31/2020   EOSABS 0.1 05/31/2020   BASOSABS 0.0 05/31/2020     Last metabolic panel Lab Results  Component Value Date   NA 143 06/08/2020   K 4.1 06/08/2020   CL 117 (H) 06/08/2020   CO2 24 06/08/2020   BUN 8 06/08/2020   CREATININE 0.40 (L) 06/08/2020   GLUCOSE 71 06/08/2020   GFRNONAA >60 06/08/2020   GFRAA >60 06/08/2020   CALCIUM 7.9 (L) 06/08/2020   PHOS 2.8 06/04/2020   PROT 5.4 (L) 06/04/2020   ALBUMIN 1.3 (L) 06/04/2020   BILITOT 0.4 06/04/2020   ALKPHOS 82 06/04/2020   AST 13 (L) 06/04/2020   ALT 10 06/04/2020   ANIONGAP 7 06/08/2020    CBG (last 3)  No results for input(s): GLUCAP in the last 72 hours.   GFR: Estimated Creatinine Clearance: 66.5 mL/min (A) (by C-G formula based on SCr of 0.4 mg/dL (L)).  Coagulation Profile: No results for input(s): INR, PROTIME in the last 168 hours.  No results found for this or any previous visit (from the past 240 hour(s)).      Radiology Studies: No results found.      Scheduled Meds: . sodium chloride   Intravenous Once  . docusate sodium  100 mg Oral BID  . feeding supplement (ENSURE ENLIVE)  237 mL Oral BID BM  . mirtazapine  7.5 mg Oral QHS  . pantoprazole  40 mg Oral Daily  . potassium chloride  40 mEq Oral Daily  . sodium chloride flush  3 mL Intravenous Once   Continuous Infusions:   LOS: 12 days     Jacquelin Hawking, MD [facility] 06/12/2020, 1:20 PM  If 7PM-7AM, please contact night-coverage [email or web address]

## 2020-06-13 NOTE — TOC Progression Note (Signed)
Transition of Care [facility]) - Progression Note    Patient Details  Name: Tracy Armstrong MRN: [id number] Date of Birth: [birth year]  [facility] CM/SW Contact  Okey Dupre Lazaro Arms, LCSW Phone Number: 06/13/2020, 5:55 PM  Clinical Narrative:  CSW received a call from [place], admissions director at [facility] that insurance denied and MD can do a peer-to-peer. She provided the following information: phone #[phone], ext. [phone], call before 5 pm. Contacted MD and information provided.  Visited room and updated daughter and patient on insurance denial and option for patient to a peer-to-peer. CSW received a call from Melissa with [facility] (2:40 pm) advising that insurance approved patient eff. 8/10 for SNF. MD contacted and updated.    Expected Discharge Plan: Skilled Nursing Facility Barriers to Discharge: Insurance Authorization  Expected Discharge Plan and Services Expected Discharge Plan: Skilled Nursing Facility In-house Referral: Clinical Social Work                                             Social Determinants of Health (SDOH) Interventions    Readmission Risk Interventions No flowsheet data found.

## 2020-06-13 NOTE — Progress Notes (Signed)
PROGRESS NOTE    Tracy Armstrong  VXB:[id number] DOB: [birth year] DOA: 05/31/2020 PCP: [name], MD   Brief Narrative: Tracy Armstrong is a 74 y.o. female with past medical history of multiple sclerosis, dementia, hypertension and chronic bedbound status. Patient presented secondary to worsening lower extremity wounds leading to AKA to treat gangrene and osteomyelitis.   Assessment & Plan:   Principal Problem:   Gangrene (HCC) Active Problems:   Sepsis (HCC)   Unstageable pressure injury of skin and tissue (HCC)   Dementia without behavioral disturbance (HCC)   HTN (hypertension)   Lactic acidosis   Palliative care by specialist   Cellulitis of left foot   Bilateral LE gangrene and osteomyelitis Severe sepsis Sepsis present on admission with tachypnea, tachycardia and lactic acidosis. Patient initially managed on Vancomycin, Cefepime and Flagyl. Orthopedic surgery consulted and performed bilateral AKA with wound vac placement on 7/30 and removal on 8/6. Sepsis physiology resolved. -Orthopedic surgery recommendations: up with therapy  Rectal bleed One occurrence per chart review. Hemoglobin stable.  History of multiple sclerosis Bed bound as an outpatient. On hospice care per chart review. Will need therapy to increase transfer ability/improve functional capacity to be as independent as possible as an outpatient.  Dementia -Continue Aricept  Essential hypertension Hypotension documented earlier in admission.  Lactic acidosis In setting of infection but unknown etiology. Resolved.  Hypokalemia Resolved with supplementation  Hypernatremia Secondary to decreased free water intake. Has resolved with increased oral intake  Anemia Acute blood loss Secondary to procedure. Received 1 unit of PRBC.  Thrombocytopenia Acute. Unknown etiology. Stable, so can follow-up outpatient.  Hypokalemia Supplementation given. Resolved.  Pressure injury Medial, bilateral  buttock, POA.   DVT prophylaxis: None in setting of thrombocytopenia and bilateral AKA Code Status:   Code Status: Full Code Family Communication: Daughter on telephone Disposition Plan: Medically stable for discharge. Discharge tomorrow to SNF secondary to insurance authorization   Consultants:   Orthopedic surgery  Procedures:   BILATERAL ABOVE KNEE AMPUTATIONS (06/03/2020)  Antimicrobials:  Vancomycin  Cefepime    Subjective: No issues overnight  Objective: Vitals:   06/12/20 1700 06/12/20 2121 06/13/20 0505 06/13/20 0845  BP: 102/62 110/67 106/69 117/67  Pulse: (!) 102 (!) 103 (!) 103 (!) 101  Resp: 16 15 15 18   Temp: 98 F (36.7 C) 98.3 F (36.8 C) 98.7 F (37.1 C) 98.5 F (36.9 C)  TempSrc: Oral Oral Oral Oral  SpO2: 98% 98% 97% 95%  Weight:  74.4 kg    Height:        Intake/Output Summary (Last 24 hours) at 06/13/2020 1525 Last data filed at 06/13/2020 1451 Gross per 24 hour  Intake 480 ml  Output 600 ml  Net -120 ml   Filed Weights   06/09/20 2103 06/11/20 2157 06/12/20 2121  Weight: 76.2 kg 76.7 kg 74.4 kg    Examination:  General: Well appearing, no distress    Data Reviewed: I have personally reviewed following labs and imaging studies  CBC Lab Results  Component Value Date   WBC 10.7 (H) 06/12/2020   RBC 2.98 (L) 06/12/2020   HGB 8.3 (L) 06/12/2020   HCT 27.0 (L) 06/12/2020   MCV 90.6 06/12/2020   MCH 27.9 06/12/2020   PLT 205 06/12/2020   MCHC 30.7 06/12/2020   RDW 18.2 (H) 06/12/2020   LYMPHSABS 1.9 05/31/2020   MONOABS 0.8 05/31/2020   EOSABS 0.1 05/31/2020   BASOSABS 0.0 05/31/2020     Last metabolic panel Lab  Results  Component Value Date   NA 143 06/12/2020   K 3.7 06/12/2020   CL 109 06/12/2020   CO2 23 06/12/2020   BUN 9 06/12/2020   CREATININE 0.45 06/12/2020   GLUCOSE 75 06/12/2020   GFRNONAA >60 06/12/2020   GFRAA >60 06/12/2020   CALCIUM 7.9 (L) 06/12/2020   PHOS 2.8 06/04/2020   PROT 5.4 (L) 06/04/2020    ALBUMIN 1.3 (L) 06/04/2020   BILITOT 0.4 06/04/2020   ALKPHOS 82 06/04/2020   AST 13 (L) 06/04/2020   ALT 10 06/04/2020   ANIONGAP 11 06/12/2020    CBG (last 3)  No results for input(s): GLUCAP in the last 72 hours.   GFR: Estimated Creatinine Clearance: 65.5 mL/min (by C-G formula based on SCr of 0.45 mg/dL).  Coagulation Profile: No results for input(s): INR, PROTIME in the last 168 hours.  No results found for this or any previous visit (from the past 240 hour(s)).      Radiology Studies: No results found.      Scheduled Meds: . sodium chloride   Intravenous Once  . docusate sodium  100 mg Oral BID  . feeding supplement (ENSURE ENLIVE)  237 mL Oral BID BM  . mirtazapine  7.5 mg Oral QHS  . pantoprazole  40 mg Oral Daily  . sodium chloride flush  3 mL Intravenous Once   Continuous Infusions:   LOS: 13 days     Jacquelin Hawking, MD [facility] 06/13/2020, 3:25 PM  If 7PM-7AM, please contact night-coverage [email or web address]

## 2020-06-14 DIAGNOSIS — R531 Weakness: Secondary | ICD-10-CM | POA: Diagnosis not present

## 2020-06-14 DIAGNOSIS — Z89611 Acquired absence of right leg above knee: Secondary | ICD-10-CM | POA: Diagnosis not present

## 2020-06-14 DIAGNOSIS — L89153 Pressure ulcer of sacral region, stage 3: Secondary | ICD-10-CM | POA: Diagnosis not present

## 2020-06-14 DIAGNOSIS — D62 Acute posthemorrhagic anemia: Secondary | ICD-10-CM | POA: Diagnosis not present

## 2020-06-14 DIAGNOSIS — L8944 Pressure ulcer of contiguous site of back, buttock and hip, stage 4: Secondary | ICD-10-CM | POA: Diagnosis not present

## 2020-06-14 DIAGNOSIS — Z4781 Encounter for orthopedic aftercare following surgical amputation: Secondary | ICD-10-CM | POA: Diagnosis not present

## 2020-06-14 DIAGNOSIS — M2550 Pain in unspecified joint: Secondary | ICD-10-CM | POA: Diagnosis not present

## 2020-06-14 DIAGNOSIS — I96 Gangrene, not elsewhere classified: Secondary | ICD-10-CM | POA: Diagnosis not present

## 2020-06-14 DIAGNOSIS — Z89612 Acquired absence of left leg above knee: Secondary | ICD-10-CM | POA: Diagnosis not present

## 2020-06-14 DIAGNOSIS — Z7401 Bed confinement status: Secondary | ICD-10-CM | POA: Diagnosis not present

## 2020-06-14 DIAGNOSIS — L8945 Pressure ulcer of contiguous site of back, buttock and hip, unstageable: Secondary | ICD-10-CM | POA: Diagnosis not present

## 2020-06-14 DIAGNOSIS — R112 Nausea with vomiting, unspecified: Secondary | ICD-10-CM | POA: Diagnosis not present

## 2020-06-14 DIAGNOSIS — I10 Essential (primary) hypertension: Secondary | ICD-10-CM | POA: Diagnosis not present

## 2020-06-14 DIAGNOSIS — G35 Multiple sclerosis: Secondary | ICD-10-CM | POA: Diagnosis not present

## 2020-06-14 LAB — SARS CORONAVIRUS 2 BY RT PCR (HOSPITAL ORDER, PERFORMED IN ~~LOC~~ HOSPITAL LAB): SARS Coronavirus 2: NEGATIVE

## 2020-06-14 MED ORDER — ENSURE ENLIVE PO LIQD: 237.0000 mL | Freq: Two times a day (BID) | ORAL | Status: AC

## 2020-06-14 MED ORDER — DOCUSATE SODIUM 100 MG PO CAPS: 100.0000 mg | ORAL_CAPSULE | Freq: Two times a day (BID) | ORAL | Status: AC

## 2020-06-14 NOTE — Progress Notes (Signed)
Report given to [place] at [facility]. Waiting for [facility] to transport patient.

## 2020-06-14 NOTE — Discharge Summary (Signed)
Physician Discharge Summary  Tracy Armstrong NOB:[id number] DOB: 1946/04/06 DOA: 05/31/2020  PCP: Tracy Burly, MD  Admit date: 05/31/2020 Discharge date: 06/14/2020  Admitted From: Home Disposition: SNF  Recommendations for Outpatient Follow-up:  1. Wound care 2. Orthopedic surgery follow-up 3. Please follow up on the following pending results: None  Discharge Condition: Stable CODE STATUS: Full code Diet recommendation: Dysphagia 2 (Fine chop);Thin liquid   Liquid Administration via: Cup;Straw Medication Administration: Crushed with puree Supervision: Staff to assist with self feeding Compensations: Slow rate;Small sips/bites Postural Changes: Seated upright at 90 degrees     Brief/Interim Summary:  Admission HPI written by Tracy Baton, MD   Chief Complaint:  Worsening lower extremity ulcers  HPI: This is a 74 year old female who is recently admitted, discharged on 7/14 with a diagnosis of coag negative staph bacteremia from left lower extremity cellulitis with osteomyelitis that is chronic as well as chronic decubiti right lower extremity.  She was discharged home with working on hospice and oral antibiotics.  During the patient there was discussion with regarding bilateral BKA however after discussion with palliative this was not done given the patient many comorbidities.  She was discharged with treatment and goals directed towards comfort.  She presents today with worsening left lower extremity decubital/ulcer.  Per daughter was present at bedside hospice states they have done everything they can.  Daughter was advised to bring the patient back to the ER.  Patient left lower extremity appears gangrenous.  There is no report of any fevers, chills or significant pain at home.  The patient does continue a poor but improved appetite.  Per daughter the patient had met the worsening confusion.  In the ER she developed nausea and vomiting.    Hospital  course:  Bilateral LE gangrene and osteomyelitis Severe sepsis Sepsis present on admission with tachypnea, tachycardia and lactic acidosis. Patient initially managed on Vancomycin, Cefepime and Flagyl. Orthopedic surgery consulted and performed bilateral AKA with wound vac placement on 7/30 and removal on 8/6. Sepsis physiology resolved. Orthopedic surgery recommendations for up with therapy. Plan to discharge to SNF for strengthening and to improve transfer ability.  Rectal bleed One occurrence per chart review. Hemoglobin stable.  History of multiple sclerosis Bed bound as an outpatient. On hospice care per chart review. Will need therapy to increase transfer ability/improve functional capacity to be as independent as possible as an outpatient.  Dementia Continue Aricept  Essential hypertension Hypotension documented earlier in admission.  Lactic acidosis In setting of infection but unknown etiology. Resolved.  Hypokalemia Resolved with supplementation  Hypernatremia Secondary to decreased free water intake. Has resolved with increased oral intake  Anemia Acute blood loss Secondary to procedure. Received 1 unit of PRBC.  Thrombocytopenia Acute. Unknown etiology. Stable, so can follow-up outpatient.  Hypokalemia Supplementation given. Resolved.  Pressure injury Medial, bilateral buttock, POA.   Discharge Diagnoses:  Principal Problem:   Gangrene ([place]) Active Problems:   Sepsis ([place])   Unstageable pressure injury of skin and tissue ([place])   Dementia without behavioral disturbance (HCC)   HTN (hypertension)   Lactic acidosis   Palliative care by specialist   Cellulitis of left foot   S/P AKA (above knee amputation) bilateral [facility])    Discharge Instructions  Discharge Instructions    Change dressing   Complete by: As directed    Daily dry dressing change. May cleanse wounds with dial or other bacterial soap and water.     Allergies as of  06/14/2020  No Known Allergies     Medication List    STOP taking these medications   cephALEXin 500 MG capsule Commonly known as: KEFLEX   doxycycline 100 MG tablet Commonly known as: VIBRA-TABS   morphine 20 MG/5ML solution     TAKE these medications   docusate sodium 100 MG capsule Commonly known as: COLACE Take 1 capsule (100 mg total) by mouth 2 (two) times daily.   feeding supplement (ENSURE ENLIVE) Liqd Take 237 mLs by mouth 2 (two) times daily between meals.   mirtazapine 7.5 MG tablet Commonly known as: REMERON Take 7.5 mg by mouth at bedtime.   ondansetron 4 MG tablet Commonly known as: ZOFRAN Take 4 mg by mouth every 6 (six) hours as needed for nausea/vomiting.   pantoprazole 40 MG tablet Commonly known as: PROTONIX Take 1 tablet (40 mg total) by mouth daily.            Discharge Care Instructions  (From admission, onward)         Start     Ordered   06/14/20 0000  Discharge wound care:       Comments: Apply dressing  Daily      Comments: May cleanse wounds with antibacterial soap and water. Apply dry dressing   06/14/20 0745   06/10/20 0000  Change dressing       Comments: Daily dry dressing change. May cleanse wounds with dial or other bacterial soap and water.   06/10/20 0745          Follow-up Information    Tracy Huguenin, NP In 1 week.   Specialty: Orthopedic Surgery Contact information: [address] [place] [address] [phone]              No Known Allergies  Consultations:  Orthopedic surgery   Procedures/Studies: [facility] Chest Port 1 View  Result Date: 05/31/2020 CLINICAL DATA:  Sepsis EXAM: PORTABLE CHEST 1 VIEW COMPARISON:  05/13/2020 FINDINGS: No focal opacity or pleural effusion. Normal cardiomediastinal silhouette. No pneumothorax. IMPRESSION: No active disease. Electronically Signed   By: Tracy Armstrong M.D.   On: 05/31/2020 22:44   ECHOCARDIOGRAM COMPLETE  Result Date: 05/16/2020    ECHOCARDIOGRAM  REPORT   Patient Name:   Tracy Armstrong Date of Exam: 05/16/2020 Medical Rec #:  [id number]      Height:       66.0 in Accession #:    [id number]     Weight:       139.3 lb Date of Birth:  Apr 15, 1946      BSA:          1.715 m Patient Age:    74 years       BP:           89/55 mmHg Patient Gender: F              HR:           97 bpm. Exam Location:  Inpatient Procedure: 2D Echo, Cardiac Doppler and Color Doppler Indications:    I50.33 Acute on chronic diastolic (congestive) heart failure  History:        Patient has no prior history of Echocardiogram examinations.                 Multiple Sclerosis.  Sonographer:    Elmarie Shiley Dance Referring Phys: Heide Scales [facility]  Sonographer Comments: Suboptimal subcostal window and Technically difficult study due to poor echo windows. IMPRESSIONS  1. Left ventricular ejection fraction,  by estimation, is 60 to 65%. The left ventricle has normal function. The left ventricle has no regional wall motion abnormalities. Left ventricular diastolic parameters are consistent with Grade I diastolic dysfunction (impaired relaxation).  2. Right ventricular systolic function is normal. The right ventricular size is normal.  3. The mitral valve is normal in structure. Trivial mitral valve regurgitation. No evidence of mitral stenosis.  4. The aortic valve is tricuspid. Aortic valve regurgitation is not visualized. No aortic stenosis is present.  5. Aortic dilatation noted. There is mild dilatation of the aortic root measuring 39 mm. FINDINGS  Left Ventricle: Left ventricular ejection fraction, by estimation, is 60 to 65%. The left ventricle has normal function. The left ventricle has no regional wall motion abnormalities. The left ventricular internal cavity size was normal in size. There is  no left ventricular hypertrophy. Left ventricular diastolic parameters are consistent with Grade I diastolic dysfunction (impaired relaxation). Right Ventricle: The right ventricular size is normal.  Right ventricular systolic function is normal. Left Atrium: Left atrial size was normal in size. Right Atrium: Right atrial size was normal in size. Pericardium: There is no evidence of pericardial effusion. Mitral Valve: The mitral valve is normal in structure. Normal mobility of the mitral valve leaflets. Trivial mitral valve regurgitation. No evidence of mitral valve stenosis. Tricuspid Valve: The tricuspid valve is normal in structure. Tricuspid valve regurgitation is mild . No evidence of tricuspid stenosis. Aortic Valve: The aortic valve is tricuspid. Aortic valve regurgitation is not visualized. No aortic stenosis is present. Pulmonic Valve: The pulmonic valve was not well visualized. Pulmonic valve regurgitation is not visualized. No evidence of pulmonic stenosis. Aorta: Aortic dilatation noted. There is mild dilatation of the aortic root measuring 39 mm. Venous: The inferior vena cava was not well visualized.  LEFT VENTRICLE PLAX 2D LVIDd:         3.70 cm  Diastology LVIDs:         2.60 cm  LV e' lateral:   7.40 cm/s LV PW:         0.70 cm  LV E/e' lateral: 9.4 LV IVS:        0.80 cm  LV e' medial:    5.98 cm/s LVOT diam:     2.00 cm  LV E/e' medial:  11.6 LV SV:         65 LV SV Index:   38 LVOT Area:     3.14 cm  RIGHT VENTRICLE RV Basal diam:  2.00 cm RV S prime:     12.00 cm/s TAPSE (M-mode): 1.0 cm LEFT ATRIUM             Index       RIGHT ATRIUM          Index LA diam:        2.50 cm 1.46 cm/m  RA Area:     5.60 cm LA Vol (A2C):   6.9 ml  4.02 ml/m  RA Volume:   8.63 ml  5.03 ml/m LA Vol (A4C):   19.3 ml 11.25 ml/m LA Biplane Vol: 12.2 ml 7.11 ml/m  AORTIC VALVE LVOT Vmax:   106.00 cm/s LVOT Vmean:  67.200 cm/s LVOT VTI:    0.207 m  AORTA Ao Root diam: 3.80 cm Ao Asc diam:  3.90 cm MITRAL VALVE               TRICUSPID VALVE MV Area (PHT): 2.86 cm    TR Peak grad:   23.2  mmHg MV Decel Time: 265 msec    TR Vmax:        241.00 cm/s MV E velocity: 69.40 cm/s MV A velocity: 79.70 cm/s  SHUNTS MV  E/A ratio:  0.87        Systemic VTI:  0.21 m                            Systemic Diam: 2.00 cm Kirk Ruths MD Electronically signed by Kirk Ruths MD Signature Date/Time: 05/16/2020/1:34:44 PM    Final       BILATERAL ABOVE KNEE AMPUTATIONS (06/03/2020)   Subjective: No issues. Thirsty.  Discharge Exam: Vitals:   06/13/20 2030 06/14/20 0457  BP: 122/70 124/76  Pulse: (!) 108 (!) 110  Resp: 18 18  Temp: 98.7 F (37.1 C) 98 F (36.7 C)  SpO2: 100% 100%   Vitals:   06/13/20 0845 06/13/20 1700 06/13/20 2030 06/14/20 0457  BP: 117/67 111/64 122/70 124/76  Pulse: (!) 101 (!) 101 (!) 108 (!) 110  Resp: '18 16 18 18  '$ Temp: 98.5 F (36.9 C) 98.4 F (36.9 C) 98.7 F (37.1 C) 98 F (36.7 C)  TempSrc: Oral Oral Oral   SpO2: 95% 96% 100% 100%  Weight:      Height:        General: Pt is alert, awake, not in acute distress Cardiovascular: Slight tachycardia, normal rhythm, S1/S2 +, no rubs, no gallops Respiratory: CTA bilaterally, no wheezing, no rhonchi Abdominal: Soft, NT, ND, bowel sounds + Extremities: bilateral AKA, no cyanosis    The results of significant diagnostics from this hospitalization (including imaging, microbiology, ancillary and laboratory) are listed below for reference.     Microbiology: No results found for this or any previous visit (from the past 240 hour(s)).   Labs: BNP (last 3 results) Recent Labs    05/15/20 0750 05/16/20 0659 05/17/20 0435  BNP 149.8* 225.9* 10.6   Basic Metabolic Panel: Recent Labs  Lab 06/08/20 0554 06/08/20 1843 06/12/20 1608  NA 148* 143 143  K 4.1  --  3.7  CL 117*  --  109  CO2 24  --  23  GLUCOSE 71  --  75  BUN 8  --  9  CREATININE 0.40*  --  0.45  CALCIUM 7.9*  --  7.9*   Liver Function Tests: No results for input(s): AST, ALT, ALKPHOS, BILITOT, PROT, ALBUMIN in the last 168 hours. No results for input(s): LIPASE, AMYLASE in the last 168 hours. No results for input(s): AMMONIA in the last 168  hours. CBC: Recent Labs  Lab 06/08/20 0554 06/12/20 1608  WBC 9.1 10.7*  HGB 9.7* 8.3*  HCT 31.8* 27.0*  MCV 88.6 90.6  PLT 78* 205   Cardiac Enzymes: No results for input(s): CKTOTAL, CKMB, CKMBINDEX, TROPONINI in the last 168 hours. BNP: Invalid input(s): POCBNP CBG: No results for input(s): GLUCAP in the last 168 hours. D-Dimer No results for input(s): DDIMER in the last 72 hours. Hgb A1c No results for input(s): HGBA1C in the last 72 hours. Lipid Profile No results for input(s): CHOL, HDL, LDLCALC, TRIG, CHOLHDL, LDLDIRECT in the last 72 hours. Thyroid function studies No results for input(s): TSH, T4TOTAL, T3FREE, THYROIDAB in the last 72 hours.  Invalid input(s): FREET3 Anemia work up No results for input(s): VITAMINB12, FOLATE, FERRITIN, TIBC, IRON, RETICCTPCT in the last 72 hours. Urinalysis    Component Value Date/Time   COLORURINE YELLOW 07/19/2011 1232  APPEARANCEUR TURBID (A) 07/19/2011 1232   LABSPEC 1.025 07/19/2011 1232   PHURINE 7.0 07/19/2011 1232   GLUCOSEU NEGATIVE 07/19/2011 1232   HGBUR MODERATE (A) 07/19/2011 1232   BILIRUBINUR NEGATIVE 07/19/2011 1232   KETONESUR 40 (A) 07/19/2011 1232   PROTEINUR NEGATIVE 07/19/2011 1232   UROBILINOGEN 0.2 07/19/2011 1232   NITRITE NEGATIVE 07/19/2011 1232   LEUKOCYTESUR MODERATE (A) 07/19/2011 1232   Sepsis Labs Invalid input(s): PROCALCITONIN,  WBC,  LACTICIDVEN Microbiology No results found for this or any previous visit (from the past 240 hour(s)).   Time coordinating discharge: 35 minutes  SIGNED:   Cordelia Poche, MD [facility] 06/14/2020, 7:38 AM

## 2020-06-14 NOTE — Progress Notes (Signed)
Patients bilateral AKA dressings were changed per MD order. Patient tolerated well. Patient will possible D/C to SNF today, RN called daughter [name] to inform that the patients wheelchair that is in the room will need to be picked up and [facility] will not transfer.

## 2020-06-14 NOTE — TOC Transition Note (Signed)
Transition of Care [facility]) - CM/SW Discharge Note *Discharged to [facility] *Room 414B *Number for Report - [phone]   Patient Details  Name: Tracy Armstrong MRN: [id number] Date of Birth: 1946/06/24  [facility]) CM/SW Contact:  Tracy Goldmann, LCSW Phone Number: 06/14/2020, 3:25 PM   Clinical Narrative: Insurance auth received for patient to discharge to SNF eff. 06/14/20. Discharge clinicals transmitted to facility, daughter advised of discharge and non-emergency ambulance transport arranged.       Final next level of care: Skilled Nursing Facility [facility]) Barriers to Discharge: Barriers Resolved (Received insurance auth eff. 8/10)   Patient Goals and CMS Choice Patient states their goals for this hospitalization and ongoing recovery are:: Daughter agreeable to ST rehab before patient returns home CMS Medicare.gov Compare Post Acute Care list provided to:: Patient Represenative (must comment) (Daughter Tracy Armstrong) Choice offered to / list presented to : Adult Children  Discharge Placement   Existing PASRR number confirmed : 06/07/20          Patient chooses bed at: [facility] Patient to be transferred to facility by: Non-emergency ambulance transport Name of family member notified: Daughter Tracy Armstrong [phone] Patient and family notified of of transfer: 06/14/20  Discharge Plan and Services In-house Referral: Clinical Social Work                                  Social Determinants of Health (SDOH) Interventions  No SDOH interventions needed at discharge.   Readmission Risk Interventions No flowsheet data found.

## 2020-06-14 NOTE — Plan of Care (Signed)
°  Problem: Coping: °Goal: Level of anxiety will decrease °Outcome: Progressing °  °

## 2020-06-16 DIAGNOSIS — Z4781 Encounter for orthopedic aftercare following surgical amputation: Secondary | ICD-10-CM | POA: Diagnosis not present

## 2020-06-16 DIAGNOSIS — R112 Nausea with vomiting, unspecified: Secondary | ICD-10-CM | POA: Diagnosis not present

## 2020-06-16 DIAGNOSIS — L8945 Pressure ulcer of contiguous site of back, buttock and hip, unstageable: Secondary | ICD-10-CM | POA: Diagnosis not present

## 2020-06-21 ENCOUNTER — Ambulatory Visit: Payer: Medicare HMO | Admitting: Family

## 2020-06-22 ENCOUNTER — Encounter: Payer: Self-pay | Admitting: Physician Assistant

## 2020-06-22 ENCOUNTER — Ambulatory Visit (INDEPENDENT_AMBULATORY_CARE_PROVIDER_SITE_OTHER): Payer: Medicare HMO | Admitting: Physician Assistant

## 2020-06-22 VITALS — Ht 66.0 in | Wt 164.0 lb

## 2020-06-22 DIAGNOSIS — M86272 Subacute osteomyelitis, left ankle and foot: Secondary | ICD-10-CM

## 2020-06-22 NOTE — Progress Notes (Signed)
Office Visit Note   Patient: Tracy Armstrong           Date of Birth: 17-Sep-1946           MRN: [id number] Visit Date: 06/22/2020              Requested by: [name], MD [address] [place],  [place] [address] PCP: [name], MD  Chief Complaint  Patient presents with  . Right Leg - Routine Post Op    06/03/20 bilateral AKA  . Left Leg - Routine Post Op      HPI: This is a pleasant woman who is accompanied by her daughter.  She is 19 days status post bilateral above-knee amputations.  Her daughter has been caring for her at home and reports she is doing well  Assessment & Plan: Visit Diagnoses: No diagnosis found.  Plan: She may cleanse the incisions with mild soap and water.  Follow-up in 1 week.  Follow-Up Instructions: No follow-ups on file.   Ortho Exam  Patient is alert, oriented, no adenopathy, well-dressed, normal affect, normal respiratory effort. Incisions are healing quite well.  Doing well post minimal to no drainage.  No cellulitis swelling is well controlled no evidence of any necrosis or dehiscence  Imaging: No results found. No images are attached to the encounter.  Labs: Lab Results  Component Value Date   CRP 18.8 (H) 05/17/2020   CRP 19.1 (H) 05/16/2020   CRP 18.2 (H) 05/15/2020   REPTSTATUS 05/16/2020 FINAL 05/13/2020   REPTSTATUS 05/18/2020 FINAL 05/13/2020   CULT (A) 05/13/2020    STAPHYLOCOCCUS SPECIES (COAGULASE NEGATIVE) THE SIGNIFICANCE OF ISOLATING THIS ORGANISM FROM A SINGLE SET OF BLOOD CULTURES WHEN MULTIPLE SETS ARE DRAWN IS UNCERTAIN. PLEASE NOTIFY THE MICROBIOLOGY DEPARTMENT WITHIN ONE WEEK IF SPECIATION AND SENSITIVITIES ARE REQUIRED. Performed at [facility], [address], [place], [place] [address]    CULT  05/13/2020    NO GROWTH 5 DAYS Performed at [facility], [address], [place], [place] [address]    Leanor Kail PNEUMONIAE 07/19/2011     Lab Results  Component Value Date   ALBUMIN  1.3 (L) 06/04/2020   ALBUMIN 1.3 (L) 06/02/2020   ALBUMIN 1.8 (L) 05/31/2020    Lab Results  Component Value Date   MG 2.0 06/04/2020   MG 2.1 06/03/2020   MG 1.6 (L) 06/02/2020   No results found for: VD25OH  No results found for: PREALBUMIN CBC EXTENDED Latest Ref Rng & Units 06/12/2020 06/08/2020 06/07/2020  WBC 4.0 - 10.5 K/uL 10.7(H) 9.1 5.1  RBC 3.87 - 5.11 MIL/uL 2.98(L) 3.59(L) 3.32(L)  HGB 12.0 - 15.0 g/dL 8.3(L) 9.7(L) 9.1(L)  HCT 36 - 46 % 27.0(L) 31.8(L) 28.8(L)  PLT 150 - 400 K/uL 205 78(L) 74(L)  NEUTROABS 1.7 - 7.7 K/uL - - -  LYMPHSABS 0.7 - 4.0 K/uL - - -     Body mass index is 26.47 kg/m.  Orders:  No orders of the defined types were placed in this encounter.  No orders of the defined types were placed in this encounter.    Procedures: No procedures performed  Clinical Data: No additional findings.  ROS:  All other systems negative, except as noted in the HPI. Review of Systems  Objective: Vital Signs: Ht 5\' 6"  (1.676 m)   Wt 164 lb (74.4 kg)   BMI 26.47 kg/m   Specialty Comments:  No specialty comments available.  PMFS History: Patient Active  Problem List   Diagnosis Date Noted  . S/P AKA (above knee amputation) bilateral (HCC) 06/14/2020  . Gangrene (HCC) 05/31/2020  . Cellulitis of left foot 05/31/2020  . Subacute osteomyelitis, left ankle and foot (HCC)   . Palliative care by specialist   . Goals of care, counseling/discussion   . DNR (do not resuscitate) discussion   . Sepsis (HCC) 05/13/2020  . Multiple sclerosis (HCC) 05/13/2020  . Unstageable pressure injury of skin and tissue (HCC) 05/13/2020  . Dementia without behavioral disturbance (HCC) 05/13/2020  . HTN (hypertension) 05/13/2020  . Lactic acidosis 05/13/2020   Past Medical History:  Diagnosis Date  . Multiple sclerosis (HCC) 2009    No family history on file.  Past Surgical History:  Procedure Laterality Date  . AMPUTATION Bilateral 06/03/2020   Procedure:  BILATERAL ABOVE KNEE AMPUTATIONS;  Surgeon: Nadara Mustard, MD;  Location: [facility] OR;  Service: Orthopedics;  Laterality: Bilateral;  . CHOLECYSTECTOMY     Social History   Occupational History  . Not on file  Tobacco Use  . Smoking status: Never Smoker  . Smokeless tobacco: Never Used  Substance and Sexual Activity  . Alcohol use: No  . Drug use: No  . Sexual activity: Not on file

## 2020-06-27 DIAGNOSIS — I10 Essential (primary) hypertension: Secondary | ICD-10-CM | POA: Diagnosis not present

## 2020-06-27 DIAGNOSIS — F0390 Unspecified dementia without behavioral disturbance: Secondary | ICD-10-CM | POA: Diagnosis not present

## 2020-06-27 DIAGNOSIS — G8222 Paraplegia, incomplete: Secondary | ICD-10-CM | POA: Diagnosis not present

## 2020-06-29 ENCOUNTER — Ambulatory Visit: Payer: Medicare HMO | Admitting: Physician Assistant

## 2020-07-22 DIAGNOSIS — G8222 Paraplegia, incomplete: Secondary | ICD-10-CM | POA: Diagnosis not present

## 2020-07-22 DIAGNOSIS — Z89512 Acquired absence of left leg below knee: Secondary | ICD-10-CM | POA: Diagnosis not present

## 2020-07-22 DIAGNOSIS — Z89511 Acquired absence of right leg below knee: Secondary | ICD-10-CM | POA: Diagnosis not present

## 2020-08-04 ENCOUNTER — Emergency Department (HOSPITAL_COMMUNITY): Payer: Medicare HMO

## 2020-08-04 ENCOUNTER — Inpatient Hospital Stay (HOSPITAL_COMMUNITY)
Admission: EM | Admit: 2020-08-04 | Discharge: 2020-08-08 | DRG: 871 | Disposition: A | Discharge status: Hospice | Payer: Medicare HMO | Attending: Internal Medicine | Admitting: Internal Medicine

## 2020-08-04 ENCOUNTER — Other Ambulatory Visit: Payer: Self-pay

## 2020-08-04 ENCOUNTER — Encounter (HOSPITAL_COMMUNITY): Payer: Self-pay | Admitting: *Deleted

## 2020-08-04 DIAGNOSIS — A419 Sepsis, unspecified organism: Secondary | ICD-10-CM | POA: Diagnosis present

## 2020-08-04 DIAGNOSIS — E870 Hyperosmolality and hypernatremia: Secondary | ICD-10-CM | POA: Diagnosis present

## 2020-08-04 DIAGNOSIS — A409 Streptococcal sepsis, unspecified: Principal | ICD-10-CM | POA: Diagnosis present

## 2020-08-04 DIAGNOSIS — R6521 Severe sepsis with septic shock: Secondary | ICD-10-CM | POA: Diagnosis present

## 2020-08-04 DIAGNOSIS — N179 Acute kidney failure, unspecified: Secondary | ICD-10-CM

## 2020-08-04 DIAGNOSIS — L8990 Pressure ulcer of unspecified site, unspecified stage: Secondary | ICD-10-CM | POA: Diagnosis present

## 2020-08-04 DIAGNOSIS — I959 Hypotension, unspecified: Secondary | ICD-10-CM | POA: Diagnosis not present

## 2020-08-04 DIAGNOSIS — Z89611 Acquired absence of right leg above knee: Secondary | ICD-10-CM

## 2020-08-04 DIAGNOSIS — K632 Fistula of intestine: Secondary | ICD-10-CM | POA: Diagnosis not present

## 2020-08-04 DIAGNOSIS — Z89612 Acquired absence of left leg above knee: Secondary | ICD-10-CM

## 2020-08-04 DIAGNOSIS — J189 Pneumonia, unspecified organism: Secondary | ICD-10-CM

## 2020-08-04 DIAGNOSIS — I10 Essential (primary) hypertension: Secondary | ICD-10-CM | POA: Diagnosis present

## 2020-08-04 DIAGNOSIS — Z20822 Contact with and (suspected) exposure to covid-19: Secondary | ICD-10-CM | POA: Diagnosis present

## 2020-08-04 DIAGNOSIS — L89120 Pressure ulcer of left upper back, unstageable: Secondary | ICD-10-CM | POA: Diagnosis present

## 2020-08-04 DIAGNOSIS — Z789 Other specified health status: Secondary | ICD-10-CM | POA: Diagnosis not present

## 2020-08-04 DIAGNOSIS — E860 Dehydration: Secondary | ICD-10-CM | POA: Diagnosis present

## 2020-08-04 DIAGNOSIS — R404 Transient alteration of awareness: Secondary | ICD-10-CM | POA: Diagnosis not present

## 2020-08-04 DIAGNOSIS — Z66 Do not resuscitate: Secondary | ICD-10-CM | POA: Diagnosis present

## 2020-08-04 DIAGNOSIS — J690 Pneumonitis due to inhalation of food and vomit: Secondary | ICD-10-CM | POA: Diagnosis present

## 2020-08-04 DIAGNOSIS — E43 Unspecified severe protein-calorie malnutrition: Secondary | ICD-10-CM | POA: Diagnosis present

## 2020-08-04 DIAGNOSIS — F0390 Unspecified dementia without behavioral disturbance: Secondary | ICD-10-CM | POA: Diagnosis present

## 2020-08-04 DIAGNOSIS — G928 Other toxic encephalopathy: Secondary | ICD-10-CM | POA: Diagnosis present

## 2020-08-04 DIAGNOSIS — E872 Acidosis: Secondary | ICD-10-CM | POA: Diagnosis present

## 2020-08-04 DIAGNOSIS — M869 Osteomyelitis, unspecified: Secondary | ICD-10-CM | POA: Diagnosis present

## 2020-08-04 DIAGNOSIS — G35 Multiple sclerosis: Secondary | ICD-10-CM | POA: Diagnosis present

## 2020-08-04 DIAGNOSIS — I248 Other forms of acute ischemic heart disease: Secondary | ICD-10-CM | POA: Diagnosis present

## 2020-08-04 DIAGNOSIS — K604 Rectal fistula: Secondary | ICD-10-CM | POA: Diagnosis not present

## 2020-08-04 DIAGNOSIS — Z833 Family history of diabetes mellitus: Secondary | ICD-10-CM

## 2020-08-04 DIAGNOSIS — R9431 Abnormal electrocardiogram [ECG] [EKG]: Secondary | ICD-10-CM

## 2020-08-04 DIAGNOSIS — R4020 Unspecified coma: Secondary | ICD-10-CM | POA: Diagnosis not present

## 2020-08-04 DIAGNOSIS — L89154 Pressure ulcer of sacral region, stage 4: Secondary | ICD-10-CM | POA: Diagnosis present

## 2020-08-04 DIAGNOSIS — R4182 Altered mental status, unspecified: Secondary | ICD-10-CM | POA: Diagnosis present

## 2020-08-04 DIAGNOSIS — Z452 Encounter for adjustment and management of vascular access device: Secondary | ICD-10-CM

## 2020-08-04 DIAGNOSIS — L89110 Pressure ulcer of right upper back, unstageable: Secondary | ICD-10-CM | POA: Diagnosis present

## 2020-08-04 DIAGNOSIS — Z8249 Family history of ischemic heart disease and other diseases of the circulatory system: Secondary | ICD-10-CM

## 2020-08-04 DIAGNOSIS — Z515 Encounter for palliative care: Secondary | ICD-10-CM | POA: Diagnosis not present

## 2020-08-04 DIAGNOSIS — L8995 Pressure ulcer of unspecified site, unstageable: Secondary | ICD-10-CM | POA: Diagnosis not present

## 2020-08-04 DIAGNOSIS — R0902 Hypoxemia: Secondary | ICD-10-CM | POA: Diagnosis not present

## 2020-08-04 DIAGNOSIS — R918 Other nonspecific abnormal finding of lung field: Secondary | ICD-10-CM | POA: Diagnosis not present

## 2020-08-04 DIAGNOSIS — K5120 Ulcerative (chronic) proctitis without complications: Secondary | ICD-10-CM | POA: Diagnosis not present

## 2020-08-04 DIAGNOSIS — R0689 Other abnormalities of breathing: Secondary | ICD-10-CM | POA: Diagnosis not present

## 2020-08-04 DIAGNOSIS — R6520 Severe sepsis without septic shock: Secondary | ICD-10-CM | POA: Diagnosis not present

## 2020-08-04 DIAGNOSIS — L89899 Pressure ulcer of other site, unspecified stage: Secondary | ICD-10-CM | POA: Diagnosis not present

## 2020-08-04 LAB — CBC WITH DIFFERENTIAL/PLATELET
Abs Immature Granulocytes: 0.12 10*3/uL — ABNORMAL HIGH (ref 0.00–0.07)
Basophils Absolute: 0.1 10*3/uL (ref 0.0–0.1)
Basophils Relative: 1 %
Eosinophils Absolute: 0 10*3/uL (ref 0.0–0.5)
Eosinophils Relative: 0 %
HCT: 30.2 % — ABNORMAL LOW (ref 36.0–46.0)
Hemoglobin: 8.6 g/dL — ABNORMAL LOW (ref 12.0–15.0)
Immature Granulocytes: 1 %
Lymphocytes Relative: 10 %
Lymphs Abs: 0.8 10*3/uL (ref 0.7–4.0)
MCH: 28.8 pg (ref 26.0–34.0)
MCHC: 28.5 g/dL — ABNORMAL LOW (ref 30.0–36.0)
MCV: 101 fL — ABNORMAL HIGH (ref 80.0–100.0)
Monocytes Absolute: 0.1 10*3/uL (ref 0.1–1.0)
Monocytes Relative: 1 %
Neutro Abs: 7.6 10*3/uL (ref 1.7–7.7)
Neutrophils Relative %: 87 %
Platelets: 82 10*3/uL — ABNORMAL LOW (ref 150–400)
RBC: 2.99 MIL/uL — ABNORMAL LOW (ref 3.87–5.11)
RDW: 20.2 % — ABNORMAL HIGH (ref 11.5–15.5)
WBC: 8.6 10*3/uL (ref 4.0–10.5)
nRBC: 3.9 % — ABNORMAL HIGH (ref 0.0–0.2)

## 2020-08-04 LAB — COMPREHENSIVE METABOLIC PANEL
ALT: <5 U/L (ref 0–44)
AST: 49 U/L — ABNORMAL HIGH (ref 15–41)
Albumin: 1.7 g/dL — ABNORMAL LOW (ref 3.5–5.0)
Alkaline Phosphatase: 134 U/L — ABNORMAL HIGH (ref 38–126)
Anion gap: 19 — ABNORMAL HIGH (ref 5–15)
BUN: 46 mg/dL — ABNORMAL HIGH (ref 8–23)
CO2: 19 mmol/L — ABNORMAL LOW (ref 22–32)
Calcium: 8.9 mg/dL (ref 8.9–10.3)
Chloride: 113 mmol/L — ABNORMAL HIGH (ref 98–111)
Creatinine, Ser: 1.57 mg/dL — ABNORMAL HIGH (ref 0.44–1.00)
GFR calc Af Amer: 38 mL/min — ABNORMAL LOW (ref >60)
GFR calc non Af Amer: 33 mL/min — ABNORMAL LOW (ref >60)
Glucose, Bld: 109 mg/dL — ABNORMAL HIGH (ref 70–99)
Potassium: 4.9 mmol/L (ref 3.5–5.1)
Sodium: 151 mmol/L — ABNORMAL HIGH (ref 135–145)
Total Bilirubin: 0.6 mg/dL (ref 0.3–1.2)
Total Protein: 6.2 g/dL — ABNORMAL LOW (ref 6.5–8.1)

## 2020-08-04 LAB — LACTIC ACID, PLASMA: Lactic Acid, Venous: >11 mmol/L (ref 0.5–1.9)

## 2020-08-04 LAB — APTT: aPTT: 33 seconds (ref 24–36)

## 2020-08-04 LAB — PROTIME-INR
INR: 1.6 — ABNORMAL HIGH (ref 0.8–1.2)
Prothrombin Time: 18.1 seconds — ABNORMAL HIGH (ref 11.4–15.2)

## 2020-08-04 LAB — TROPONIN I (HIGH SENSITIVITY): Troponin I (High Sensitivity): 102 ng/L (ref <18)

## 2020-08-04 MED ORDER — LACTATED RINGERS IV BOLUS (SEPSIS)
250.0000 mL | Freq: Once | INTRAVENOUS | Status: AC
  Administered 2020-08-04: 250 mL via INTRAVENOUS

## 2020-08-04 MED ORDER — SODIUM CHLORIDE 0.9 % IV SOLN
2.0000 g | Freq: Once | INTRAVENOUS | Status: AC
  Administered 2020-08-04: 2 g via INTRAVENOUS
  Filled 2020-08-04: qty 2

## 2020-08-04 MED ORDER — LACTATED RINGERS IV SOLN: INTRAVENOUS | Status: AC

## 2020-08-04 MED ORDER — METRONIDAZOLE IN NACL 5-0.79 MG/ML-% IV SOLN
500.0000 mg | Freq: Once | INTRAVENOUS | Status: AC
  Administered 2020-08-04: 500 mg via INTRAVENOUS
  Filled 2020-08-04: qty 100

## 2020-08-04 MED ORDER — LACTATED RINGERS IV BOLUS (SEPSIS)
1000.0000 mL | Freq: Once | INTRAVENOUS | Status: AC
  Administered 2020-08-04: 1000 mL via INTRAVENOUS

## 2020-08-04 MED ORDER — VANCOMYCIN HCL IN DEXTROSE 1-5 GM/200ML-% IV SOLN
1000.0000 mg | Freq: Once | INTRAVENOUS | Status: AC
  Administered 2020-08-04: 1000 mg via INTRAVENOUS
  Filled 2020-08-04: qty 200

## 2020-08-04 MED ORDER — PHENYLEPHRINE HCL (PRESSORS) 10 MG/ML IV SOLN
INTRAVENOUS | Status: AC
  Filled 2020-08-04: qty 1

## 2020-08-04 MED ORDER — SODIUM CHLORIDE 0.9 % IV SOLN
2.0000 g | Freq: Two times a day (BID) | INTRAVENOUS | Status: DC
  Administered 2020-08-05: 2 g via INTRAVENOUS
  Filled 2020-08-04: qty 2

## 2020-08-04 MED ORDER — IOHEXOL 300 MG/ML  SOLN: 100.0000 mL | Freq: Once | INTRAMUSCULAR | Status: DC | PRN

## 2020-08-04 MED ORDER — VANCOMYCIN HCL 500 MG/100ML IV SOLN
500.0000 mg | INTRAVENOUS | Status: DC
  Administered 2020-08-05: 500 mg via INTRAVENOUS
  Filled 2020-08-04 (×3): qty 100

## 2020-08-04 MED ORDER — PHENYLEPHRINE HCL-NACL 10-0.9 MG/250ML-% IV SOLN
0.0000 ug/min | INTRAVENOUS | Status: DC
  Administered 2020-08-04: 20 ug/min via INTRAVENOUS
  Administered 2020-08-05: 80 ug/min via INTRAVENOUS
  Filled 2020-08-04 (×2): qty 250

## 2020-08-04 NOTE — Progress Notes (Signed)
Pharmacy Antibiotic Note  Tracy Armstrong is a 74 y.o. female admitted on 08/04/2020 with sepsis.  Pharmacy has been consulted for Vancomycin and Cefepime dosing.  Estimated dosing wt ~50kg, est CrCl 30 ml/min  Plan: Cefepime 2gm IV q12 Vancomycin 1000mg  IV Now then 500mg  IV q24h Will f/u renal function, micro data, and pt's clinical condition Vanc levels prn F/u accurate wt - pt with b/l BKA recently     Temp (24hrs), Avg:100.9 F (38.3 C), Min:100.9 F (38.3 C), Max:100.9 F (38.3 C)  Recent Labs  Lab 08/04/20 2210  WBC 8.6  CREATININE 1.57*  LATICACIDVEN >11*    CrCl cannot be calculated (Unknown ideal weight.).    No Known Allergies  Antimicrobials this admission: 9/30 Vanc >>  9/30 Cefepime >>  9/30 Flagyl x 1  Microbiology results: 9/30 BCx:   UCx:   Thank you for allowing pharmacy to be a part of this patient's care.  10/30, PharmD, BCPS Please see amion for complete clinical pharmacist phone list 08/04/2020 11:33 PM

## 2020-08-04 NOTE — ED Notes (Signed)
Date and time results received: 08/04/20 2256 (use smartphrase ".now" to insert current time)  Test: Lactic acid Critical Value: >11  Name of Provider Notified: Dr Effie Shy  Orders Received? Or Actions Taken?: Actions Taken: no orders received

## 2020-08-04 NOTE — ED Provider Notes (Signed)
[facility] EMERGENCY DEPARTMENT Provider Note   CSN: [id number] Arrival date & time: 08/04/20  2030     History Chief Complaint  Patient presents with  . Altered Mental Status    Tracy Armstrong is a 74 y.o. female.  HPI She presents by EMS for altered mental status.  Patient's daughter, "[name]," Sonia Baller) arrived to give additional history.  She states that today the patient was less responsive than usual today feed when she attempted to feed the patient.  This is unusual.  Earlier today the patient was able to communicate some but has become unresponsive since then.  She was hospitalized 3 months ago, and at that time had bilateral AKA's done.  She had a period of time in the rehab facility after that, but has been home for several weeks.  Patient has been DNR in the past, but currently is not DNR according to the daughter.  Level 5 caveat-altered mental status    Past Medical History:  Diagnosis Date  . Multiple sclerosis ([place]) 2009    Patient Active Problem List   Diagnosis Date Noted  . S/P AKA (above knee amputation) bilateral ([place]) 06/14/2020  . Gangrene ([place]) 05/31/2020  . Cellulitis of left foot 05/31/2020  . Subacute osteomyelitis, left ankle and foot ([place])   . Palliative care by specialist   . Goals of care, counseling/discussion   . DNR (do not resuscitate) discussion   . Sepsis ([place]) 05/13/2020  . Multiple sclerosis ([place]) 05/13/2020  . Unstageable pressure injury of skin and tissue ([place]) 05/13/2020  . Dementia without behavioral disturbance ([place]) 05/13/2020  . HTN (hypertension) 05/13/2020  . Lactic acidosis 05/13/2020    Past Surgical History:  Procedure Laterality Date  . AMPUTATION Bilateral 06/03/2020   Procedure: BILATERAL ABOVE KNEE AMPUTATIONS;  Surgeon: Newt Minion, MD;  Location: [facility];  Service: Orthopedics;  Laterality: Bilateral;  . CHOLECYSTECTOMY       OB History   No obstetric history on file.     History reviewed. No pertinent  family history.  Social History   Tobacco Use  . Smoking status: Never Smoker  . Smokeless tobacco: Never Used  Substance Use Topics  . Alcohol use: No  . Drug use: No    Home Medications Prior to Admission medications   Medication Sig Start Date End Date Taking? Authorizing Provider  docusate sodium (COLACE) 100 MG capsule Take 1 capsule (100 mg total) by mouth 2 (two) times daily. 06/14/20   Mariel Aloe, MD  feeding supplement, ENSURE ENLIVE, (ENSURE ENLIVE) LIQD Take 237 mLs by mouth 2 (two) times daily between meals. 06/14/20   Mariel Aloe, MD  mirtazapine (REMERON) 7.5 MG tablet Take 7.5 mg by mouth at bedtime. 05/02/20   [provider]  ondansetron (ZOFRAN) 4 MG tablet Take 4 mg by mouth every 6 (six) hours as needed for nausea/vomiting. 05/19/20   [provider]  pantoprazole (PROTONIX) 40 MG tablet Take 1 tablet (40 mg total) by mouth daily. 05/19/20   Elgergawy, Silver Huguenin, MD    Allergies    Patient has no known allergies.  Review of Systems   Review of Systems  Unable to perform ROS: Mental status change    Physical Exam Updated Vital Signs BP (!) 82/55   Pulse (!) 30   Temp (!) 100.9 F (38.3 C) (Rectal)   Resp (!) 22   SpO2 (!) 60%   Physical Exam Vitals and nursing note reviewed.  Constitutional:  General: She is in acute distress.     Appearance: She is well-developed. She is ill-appearing and toxic-appearing. She is not diaphoretic.     Comments: Very frail elderly female  HENT:     Head: Normocephalic and atraumatic.     Right Ear: External ear normal.     Left Ear: External ear normal.     Mouth/Throat:     Mouth: Mucous membranes are dry.     Pharynx: No oropharyngeal exudate or posterior oropharyngeal erythema.  Eyes:     General:        Right eye: No discharge.        Left eye: No discharge.     Conjunctiva/sclera: Conjunctivae normal.     Pupils: Pupils are equal, round, and reactive to light.  Neck:      Trachea: Phonation normal.  Cardiovascular:     Rate and Rhythm: Regular rhythm. Tachycardia present.     Heart sounds: Normal heart sounds.  Pulmonary:     Effort: Pulmonary effort is normal. No respiratory distress.     Breath sounds: Normal breath sounds. No stridor.  Abdominal:     General: There is no distension.     Palpations: Abdomen is soft.     Tenderness: There is no abdominal tenderness.  Musculoskeletal:        General: No swelling or tenderness. Normal range of motion.     Cervical back: Normal range of motion and neck supple.     Comments: Bilateral AKA, stumps appear normal.  Skin:    General: Skin is warm and dry.  Neurological:     Mental Status: She is alert.     Cranial Nerves: No cranial nerve deficit.     Motor: No abnormal muscle tone.     Comments: Responsive to painful stimuli, by pushing away  Psychiatric:     Comments: Obtunded     ED Results / Procedures / Treatments   Labs (all labs ordered are listed, but only abnormal results are displayed) Labs Reviewed  LACTIC ACID, PLASMA - Abnormal; Notable for the following components:      Result Value   Lactic Acid, Venous >11 (*)    All other components within normal limits  COMPREHENSIVE METABOLIC PANEL - Abnormal; Notable for the following components:   Sodium 151 (*)    Chloride 113 (*)    CO2 19 (*)    Glucose, Bld 109 (*)    BUN 46 (*)    Creatinine, Ser 1.57 (*)    Total Protein 6.2 (*)    Albumin 1.7 (*)    AST 49 (*)    Alkaline Phosphatase 134 (*)    GFR calc non Af Amer 33 (*)    GFR calc Af Amer 38 (*)    Anion gap 19 (*)    All other components within normal limits  CBC WITH DIFFERENTIAL/PLATELET - Abnormal; Notable for the following components:   RBC 2.99 (*)    Hemoglobin 8.6 (*)    HCT 30.2 (*)    MCV 101.0 (*)    MCHC 28.5 (*)    RDW 20.2 (*)    Platelets 82 (*)    nRBC 3.9 (*)    Abs Immature Granulocytes 0.12 (*)    All other components within normal limits   PROTIME-INR - Abnormal; Notable for the following components:   Prothrombin Time 18.1 (*)    INR 1.6 (*)    All other components within normal limits  BLOOD  GAS, ARTERIAL - Abnormal; Notable for the following components:   pH, Arterial 7.283 (*)    pO2, Arterial 121 (*)    Bicarbonate 17.1 (*)    Acid-base deficit 9.0 (*)    All other components within normal limits  TROPONIN I (HIGH SENSITIVITY) - Abnormal; Notable for the following components:   Troponin I (High Sensitivity) 102 (*)    All other components within normal limits  CULTURE, BLOOD (ROUTINE X 2)  CULTURE, BLOOD (ROUTINE X 2)  URINE CULTURE  RESPIRATORY PANEL BY RT PCR (FLU A&B, COVID)  APTT  LACTIC ACID, PLASMA  URINALYSIS, ROUTINE W REFLEX MICROSCOPIC  TROPONIN I (HIGH SENSITIVITY)    EKG EKG Interpretation  Date/Time:  Thursday August 04 2020 22:32:00 EDT Ventricular Rate:  111 PR Interval:    QRS Duration: 234 QT Interval:  460 QTC Calculation: 634 R Axis:   3 Text Interpretation: Sinus tachycardia Nonspecific intraventricular conduction delay Repol abnrm suggests ischemia, anterolateral global ischemia Since last tracing ischemia is present Confirmed by Daleen Bo [phone]) on 08/04/2020 10:35:16 PM   Radiology DG Chest Port 1 View  Result Date: 08/04/2020 CLINICAL DATA:  Code sepsis altered low blood pressure EXAM: PORTABLE CHEST 1 VIEW COMPARISON:  05/31/2020, 05/13/2020 FINDINGS: Left lung is grossly clear. Patchy vague airspace opacities in the right mid lung and lung base. No pleural effusion. Normal heart size. No pneumothorax. IMPRESSION: Patchy vague airspace opacities in the right mid lung and lung base, suspicious for pneumonia. Electronically Signed   By: Donavan Foil M.D.   On: 08/04/2020 21:41   DG Chest Port 1V same Day  Result Date: 08/04/2020 CLINICAL DATA:  Central line placement. History of multiple sclerosis. EXAM: PORTABLE CHEST 1 VIEW COMPARISON:  08/04/2020 FINDINGS: Heart size  and pulmonary vascularity are normal. Right central venous catheter with tip over the cavoatrial junction. Peribronchial thickening with patchy perihilar opacities on the right. This may represent bronchitis or pneumonia. No significant change since previous study, allowing for differences in technique. No pleural effusions. No pneumothorax. Mediastinal contours are normal. Degenerative changes in the spine and shoulders. Surgical clips in the right upper quadrant. IMPRESSION: 1. Right central venous catheter with tip over the cavoatrial junction. No pneumothorax. 2. Peribronchial thickening with patchy perihilar opacities on the right suggesting bronchitis or pneumonia. Electronically Signed   By: Lucienne Capers M.D.   On: 08/04/2020 22:40    Procedures .Central Line  Date/Time: 08/04/2020 10:24 PM Performed by: Daleen Bo, MD Authorized by: Daleen Bo, MD   Consent:    Consent obtained:  Verbal   Consent given by:  Healthcare agent   Risks discussed:  Arterial puncture, bleeding, nerve damage and pneumothorax   Alternatives discussed:  No treatment Pre-procedure details:    Hand hygiene: Hand hygiene performed prior to insertion     Sterile barrier technique: All elements of maximal sterile technique followed     Skin preparation:  2% chlorhexidine   Skin preparation agent: Skin preparation agent completely dried prior to procedure   Anesthesia (see MAR for exact dosages):    Anesthesia method:  Local infiltration   Local anesthetic:  Lidocaine 1% w/o epi Procedure details:    Location:  R subclavian   Procedural supplies:  Triple lumen   Catheter size:  7.5 Fr   Landmarks identified: yes     Ultrasound guidance: no     Number of attempts:  1   Successful placement: yes   Post-procedure details:    Post-procedure:  Dressing  applied and line sutured   Assessment:  Blood return through all ports   Patient tolerance of procedure:  Tolerated well, no immediate  complications  .Critical Care Performed by: Daleen Bo, MD Authorized by: Daleen Bo, MD   Critical care provider statement:    Critical care time (minutes):  75   Critical care start time:  08/04/2020 9:00 PM   Critical care end time:  08/05/2020 12:49 AM   Critical care time was exclusive of:  Separately billable procedures and treating other patients   Critical care was necessary to treat or prevent imminent or life-threatening deterioration of the following conditions:  Circulatory failure and sepsis   Critical care was time spent personally by me on the following activities:  Blood draw for specimens, development of treatment plan with patient or surrogate, discussions with consultants, evaluation of patient's response to treatment, examination of patient, obtaining history from patient or surrogate, ordering and performing treatments and interventions, ordering and review of laboratory studies, pulse oximetry, re-evaluation of patient's condition, review of old charts and ordering and review of radiographic studies   (including critical care time)  Medications Ordered in ED Medications  lactated ringers infusion ( Intravenous New Bag/Given 08/04/20 2245)  phenylephrine (NEOSYNEPHRINE) 10-0.9 MG/250ML-% infusion (60 mcg/min Intravenous Rate/Dose Change 08/05/20 0040)  ceFEPIme (MAXIPIME) 2 g in sodium chloride 0.9 % 100 mL IVPB (has no administration in time range)  vancomycin (VANCOREADY) IVPB 500 mg/100 mL (has no administration in time range)  iohexol (OMNIPAQUE) 300 MG/ML solution 100 mL (has no administration in time range)  lactated ringers bolus 1,000 mL (0 mLs Intravenous Stopped 08/04/20 2344)    And  lactated ringers bolus 1,000 mL (0 mLs Intravenous Stopped 08/04/20 2356)    And  lactated ringers bolus 250 mL (0 mLs Intravenous Stopped 08/04/20 2249)  metroNIDAZOLE (FLAGYL) IVPB 500 mg (0 mg Intravenous Stopped 08/04/20 2341)  ceFEPIme (MAXIPIME) 2 g in sodium chloride  0.9 % 100 mL IVPB (0 g Intravenous Stopped 08/04/20 2334)  vancomycin (VANCOCIN) IVPB 1000 mg/200 mL premix (1,000 mg Intravenous New Bag/Given 08/04/20 2346)    ED Course  I have reviewed the triage vital signs and the nursing notes.  Pertinent labs & imaging results that were available during my care of the patient were reviewed by me and considered in my medical decision making (see chart for details).  Clinical Course as of Aug 05 48  Thu Aug 04, 2020  2223 10:05 PM-I had a protracted discussion with the patient's daughter, Sonia Baller; regarding CODE STATUS.  Also talked to another daughter on the phone, who stated that she was the oldest daughter.  Together the daughters decided that the patient would not be resuscitated with intubation, or cardiac compressions, should she have cardiopulmonary arrest.  They did want to proceed with vascular access using a central line.  After appropriate counseling, I proceeded with that.   [EW]  2323 Normal except hemoglobin low, MCV high, platelets low  CBC WITH DIFFERENTIAL(!) [EW]  2324 Normal except sodium high, chloride high, CO2 low, glucose high, BUN high, creatinine high, total protein low, albumin low, AST high, alk phos stays high, GFR low, anion gap high  Comprehensive metabolic panel(!) [EW]  5638 Markedly elevated  Lactic acid, plasma(!!) [EW]  2324 Abnormal, high  Protime-INR(!) [EW]  2325 Possible right lower lobe pneumonia.Placement, SVC/atrial junction  DG Chest Port 1V same Day [EW]  Fri Aug 05, 2020  0046 Mild acidosis, elevated PO2, normal PCO2  Blood gas, arterial (  at [place])(!) [EW]  0048 Mild elevation  Troponin I (High Sensitivity)(!!) [EW]    Clinical Course User Index [EW] Daleen Bo, MD   MDM Rules/Calculators/A&P                           Patient Vitals for the past 24 hrs:  BP Temp Temp src Pulse Resp SpO2  08/05/20 0030 (!) 82/55 -- -- -- (!) 22 --  08/05/20 0000 (!) 72/42 -- -- (!) 30 20 (!) 60 %   08/04/20 2345 (!) 85/69 -- -- -- (!) 26 --  08/04/20 2330 (!) 82/52 -- -- (!) 102 (!) 22 --  08/04/20 2300 (!) 60/36 -- -- (!) 110 19 (!) 33 %  08/04/20 2245 (!) 41/13 -- -- (!) 110 (!) 21 (!) 44 %  08/04/20 2230 (!) 39/27 -- -- (!) 108 (!) 21 (!) 39 %  08/04/20 2145 -- -- -- (!) 33 -- 96 %  08/04/20 2130 97/71 -- -- (!) 29 -- --  08/04/20 2050 (!) 71/50 (!) 100.9 F (38.3 C) Rectal (!) 129 (!) 22 95 %      Medical Decision Making:  This patient is presenting for evaluation of decreased responsiveness with sepsis parameters, which does require a range of treatment options, and is a complaint that involves a high risk of morbidity and mortality. The differential diagnoses include bacterial infection, viral infection, metabolic instability, vascular collapse. I decided to review old records, and in summary elderly female, likely near end-of-life, presenting with decreased responsiveness.  Family members made the patient DNR, in the ED, but wanted to proceed with central vascular access for diagnostic and treatment purposes.  They are comfortable with IV fluids and antibiotics at this time.Corene Cornea I obtained additional historical information from family members.  Clinical Laboratory Tests Ordered, included CBC, Metabolic panel, Urinalysis and Sepsis labs, Covid test. Review indicates sodium high, chloride high, BUN high, creatinine high, total protein low, GFR low, ALT high, hemoglobin low, pH low, PO2 high, PCO2 normal. Radiologic Tests Ordered, included chest x-ray.  I independently Visualized: Radiographic images, which show possible right lung pneumonia  Cardiac Monitor Tracing which shows sinus tachycardia    Critical Interventions-clinical evaluation, central venous access, by me; laboratory testing, radiography, sepsis treatment with fluids and empiric antibiotics, observation reassessment  After These Interventions, the Patient was reevaluated and was found with sepsis requiring  hospitalization  CRITICAL CARE-yes Performed by: Daleen Bo  Nursing Notes Reviewed/ Care Coordinated Applicable Imaging Reviewed Interpretation of Laboratory Data incorporated into ED treatment  11:25 PM-requested callback from intensivist service, to discuss possible admission.  Case discussed with Dr. Lucile Shutters, PCCM.  He states that the patient plan admitted to hospitalist service, since she is DNR/DNI, and he recommends CT abdomen pelvis to look for acute intra-abdominal abnormalities.  12:34 AM-Consult complete with hospitalist. Patient case explained and discussed.  She will agrees to admit patient for further evaluation and treatment. Call ended at 12:46 AM   Final Clinical Impression(s) / ED Diagnoses Final diagnoses:  Sepsis with acute organ dysfunction and septic shock, due to unspecified organism, unspecified type ([place])  AKI (acute kidney injury) ([place])  Community acquired pneumonia of right lower lobe of lung  Abnormal EKG  DNR (do not resuscitate)  DNI (do not intubate)    Rx / DC Orders ED Discharge Orders    None       Daleen Bo, MD 08/05/20 [phone]

## 2020-08-04 NOTE — ED Notes (Signed)
Pt occasionally moans, family at bedside at this time.

## 2020-08-04 NOTE — ED Triage Notes (Signed)
Pt brought in by [facility] for c/o ams; family reported to ems that pt is usually talkative but pt has been not talking today and moaning; pt is a recent bilateral aka on July 29; pt is alert but unresponsive

## 2020-08-04 NOTE — ED Notes (Signed)
Date and time results received: 08/04/20 2333 (use smartphrase ".now" to insert current time)  Test: troponin Critical Value: 111  Name of Provider Notified: Dr. Effie Shy  Orders Received? Or Actions Taken?: no/na

## 2020-08-05 ENCOUNTER — Other Ambulatory Visit: Payer: Self-pay

## 2020-08-05 DIAGNOSIS — L8995 Pressure ulcer of unspecified site, unstageable: Secondary | ICD-10-CM | POA: Diagnosis not present

## 2020-08-05 DIAGNOSIS — J690 Pneumonitis due to inhalation of food and vomit: Secondary | ICD-10-CM | POA: Diagnosis present

## 2020-08-05 DIAGNOSIS — F0390 Unspecified dementia without behavioral disturbance: Secondary | ICD-10-CM | POA: Diagnosis present

## 2020-08-05 DIAGNOSIS — A409 Streptococcal sepsis, unspecified: Secondary | ICD-10-CM | POA: Diagnosis present

## 2020-08-05 DIAGNOSIS — Z515 Encounter for palliative care: Secondary | ICD-10-CM | POA: Diagnosis not present

## 2020-08-05 DIAGNOSIS — R6521 Severe sepsis with septic shock: Secondary | ICD-10-CM

## 2020-08-05 DIAGNOSIS — K632 Fistula of intestine: Secondary | ICD-10-CM | POA: Diagnosis not present

## 2020-08-05 DIAGNOSIS — Z89612 Acquired absence of left leg above knee: Secondary | ICD-10-CM | POA: Diagnosis not present

## 2020-08-05 DIAGNOSIS — E43 Unspecified severe protein-calorie malnutrition: Secondary | ICD-10-CM | POA: Diagnosis present

## 2020-08-05 DIAGNOSIS — A419 Sepsis, unspecified organism: Secondary | ICD-10-CM

## 2020-08-05 DIAGNOSIS — R4182 Altered mental status, unspecified: Secondary | ICD-10-CM | POA: Diagnosis present

## 2020-08-05 DIAGNOSIS — Z789 Other specified health status: Secondary | ICD-10-CM | POA: Diagnosis not present

## 2020-08-05 DIAGNOSIS — G928 Other toxic encephalopathy: Secondary | ICD-10-CM | POA: Diagnosis present

## 2020-08-05 DIAGNOSIS — G35 Multiple sclerosis: Secondary | ICD-10-CM | POA: Diagnosis present

## 2020-08-05 DIAGNOSIS — L89154 Pressure ulcer of sacral region, stage 4: Secondary | ICD-10-CM | POA: Diagnosis present

## 2020-08-05 DIAGNOSIS — K604 Rectal fistula: Secondary | ICD-10-CM | POA: Diagnosis not present

## 2020-08-05 DIAGNOSIS — Z20822 Contact with and (suspected) exposure to covid-19: Secondary | ICD-10-CM | POA: Diagnosis present

## 2020-08-05 DIAGNOSIS — I10 Essential (primary) hypertension: Secondary | ICD-10-CM | POA: Diagnosis present

## 2020-08-05 DIAGNOSIS — E872 Acidosis: Secondary | ICD-10-CM | POA: Diagnosis present

## 2020-08-05 DIAGNOSIS — N179 Acute kidney failure, unspecified: Secondary | ICD-10-CM | POA: Diagnosis present

## 2020-08-05 DIAGNOSIS — M869 Osteomyelitis, unspecified: Secondary | ICD-10-CM | POA: Diagnosis present

## 2020-08-05 DIAGNOSIS — E870 Hyperosmolality and hypernatremia: Secondary | ICD-10-CM | POA: Diagnosis present

## 2020-08-05 DIAGNOSIS — I248 Other forms of acute ischemic heart disease: Secondary | ICD-10-CM | POA: Diagnosis present

## 2020-08-05 DIAGNOSIS — K5120 Ulcerative (chronic) proctitis without complications: Secondary | ICD-10-CM | POA: Diagnosis not present

## 2020-08-05 DIAGNOSIS — L89899 Pressure ulcer of other site, unspecified stage: Secondary | ICD-10-CM | POA: Diagnosis not present

## 2020-08-05 DIAGNOSIS — Z66 Do not resuscitate: Secondary | ICD-10-CM | POA: Diagnosis present

## 2020-08-05 LAB — BLOOD CULTURE ID PANEL (REFLEXED) - BCID2

## 2020-08-05 LAB — CBC WITH DIFFERENTIAL/PLATELET
Abs Immature Granulocytes: 0.13 10*3/uL — ABNORMAL HIGH (ref 0.00–0.07)
Basophils Absolute: 0.1 10*3/uL (ref 0.0–0.1)
Basophils Relative: 0 %
Eosinophils Absolute: 0 10*3/uL (ref 0.0–0.5)
Eosinophils Relative: 0 %
HCT: 24.9 % — ABNORMAL LOW (ref 36.0–46.0)
Hemoglobin: 7.3 g/dL — ABNORMAL LOW (ref 12.0–15.0)
Immature Granulocytes: 1 %
Lymphocytes Relative: 6 %
Lymphs Abs: 1 10*3/uL (ref 0.7–4.0)
MCH: 29 pg (ref 26.0–34.0)
MCHC: 29.3 g/dL — ABNORMAL LOW (ref 30.0–36.0)
MCV: 98.8 fL (ref 80.0–100.0)
Monocytes Absolute: 0.3 10*3/uL (ref 0.1–1.0)
Monocytes Relative: 2 %
Neutro Abs: 14.2 10*3/uL — ABNORMAL HIGH (ref 1.7–7.7)
Neutrophils Relative %: 91 %
Platelets: 73 10*3/uL — ABNORMAL LOW (ref 150–400)
RBC: 2.52 MIL/uL — ABNORMAL LOW (ref 3.87–5.11)
RDW: 20.2 % — ABNORMAL HIGH (ref 11.5–15.5)
WBC Morphology: INCREASED
WBC: 15.7 10*3/uL — ABNORMAL HIGH (ref 4.0–10.5)
nRBC: 1.3 % — ABNORMAL HIGH (ref 0.0–0.2)

## 2020-08-05 LAB — URINALYSIS, ROUTINE W REFLEX MICROSCOPIC
Bilirubin Urine: NEGATIVE
Glucose, UA: NEGATIVE mg/dL
Ketones, ur: NEGATIVE mg/dL
Nitrite: NEGATIVE
Protein, ur: 100 mg/dL — AB
RBC / HPF: >50 RBC/hpf — ABNORMAL HIGH (ref 0–5)
Specific Gravity, Urine: 1.017 (ref 1.005–1.030)
WBC, UA: >50 WBC/hpf — ABNORMAL HIGH (ref 0–5)
pH: 5 (ref 5.0–8.0)

## 2020-08-05 LAB — BLOOD GAS, VENOUS
Acid-base deficit: 4.1 mmol/L — ABNORMAL HIGH (ref 0.0–2.0)
Bicarbonate: 20.8 mmol/L (ref 20.0–28.0)
Drawn by: 1528
FIO2: 100
O2 Saturation: 72.6 %
Patient temperature: 38.2
pCO2, Ven: 32.2 mmHg — ABNORMAL LOW (ref 44.0–60.0)
pH, Ven: 7.405 (ref 7.250–7.430)
pO2, Ven: 41.9 mmHg (ref 32.0–45.0)

## 2020-08-05 LAB — BLOOD GAS, ARTERIAL
Acid-base deficit: 9 mmol/L — ABNORMAL HIGH (ref 0.0–2.0)
Bicarbonate: 17.1 mmol/L — ABNORMAL LOW (ref 20.0–28.0)
Drawn by: 35043
FIO2: 40
O2 Saturation: 98 %
Patient temperature: 38.2
pCO2 arterial: 35.9 mmHg (ref 32.0–48.0)
pH, Arterial: 7.283 — ABNORMAL LOW (ref 7.350–7.450)
pO2, Arterial: 121 mmHg — ABNORMAL HIGH (ref 83.0–108.0)

## 2020-08-05 LAB — COMPREHENSIVE METABOLIC PANEL
ALT: 48 U/L — ABNORMAL HIGH (ref 0–44)
AST: 187 U/L — ABNORMAL HIGH (ref 15–41)
Albumin: 1.3 g/dL — ABNORMAL LOW (ref 3.5–5.0)
Alkaline Phosphatase: 104 U/L (ref 38–126)
Anion gap: 14 (ref 5–15)
BUN: 40 mg/dL — ABNORMAL HIGH (ref 8–23)
CO2: 19 mmol/L — ABNORMAL LOW (ref 22–32)
Calcium: 8 mg/dL — ABNORMAL LOW (ref 8.9–10.3)
Chloride: 113 mmol/L — ABNORMAL HIGH (ref 98–111)
Creatinine, Ser: 1.25 mg/dL — ABNORMAL HIGH (ref 0.44–1.00)
GFR calc Af Amer: 50 mL/min — ABNORMAL LOW (ref >60)
GFR calc non Af Amer: 43 mL/min — ABNORMAL LOW (ref >60)
Glucose, Bld: 127 mg/dL — ABNORMAL HIGH (ref 70–99)
Potassium: 4 mmol/L (ref 3.5–5.1)
Sodium: 146 mmol/L — ABNORMAL HIGH (ref 135–145)
Total Bilirubin: 0.7 mg/dL (ref 0.3–1.2)
Total Protein: 4.7 g/dL — ABNORMAL LOW (ref 6.5–8.1)

## 2020-08-05 LAB — TROPONIN I (HIGH SENSITIVITY): Troponin I (High Sensitivity): 102 ng/L (ref <18)

## 2020-08-05 LAB — RESPIRATORY PANEL BY RT PCR (FLU A&B, COVID)
Influenza A by PCR: NEGATIVE
Influenza B by PCR: NEGATIVE
SARS Coronavirus 2 by RT PCR: NEGATIVE

## 2020-08-05 LAB — CORTISOL-AM, BLOOD: Cortisol - AM: >100 ug/dL — ABNORMAL HIGH (ref 6.7–22.6)

## 2020-08-05 LAB — PROTIME-INR
INR: 1.7 — ABNORMAL HIGH (ref 0.8–1.2)
Prothrombin Time: 19.2 seconds — ABNORMAL HIGH (ref 11.4–15.2)

## 2020-08-05 LAB — LACTIC ACID, PLASMA
Lactic Acid, Venous: 8.5 mmol/L (ref 0.5–1.9)
Lactic Acid, Venous: 5.6 mmol/L (ref 0.5–1.9)

## 2020-08-05 LAB — PROCALCITONIN: Procalcitonin: 12.25 ng/mL

## 2020-08-05 LAB — MAGNESIUM: Magnesium: 1.6 mg/dL — ABNORMAL LOW (ref 1.7–2.4)

## 2020-08-05 LAB — PHOSPHORUS: Phosphorus: 2.4 mg/dL — ABNORMAL LOW (ref 2.5–4.6)

## 2020-08-05 MED ORDER — COLLAGENASE 250 UNIT/GM EX OINT
TOPICAL_OINTMENT | Freq: Every day | CUTANEOUS | Status: DC
  Filled 2020-08-05: qty 30

## 2020-08-05 MED ORDER — DOCUSATE SODIUM 100 MG PO CAPS: 100.0000 mg | ORAL_CAPSULE | Freq: Two times a day (BID) | ORAL | Status: DC

## 2020-08-05 MED ORDER — ACETAMINOPHEN 650 MG RE SUPP: 650.0000 mg | Freq: Four times a day (QID) | RECTAL | Status: DC | PRN

## 2020-08-05 MED ORDER — ACETAMINOPHEN 325 MG PO TABS: 650.0000 mg | ORAL_TABLET | Freq: Four times a day (QID) | ORAL | Status: DC | PRN

## 2020-08-05 MED ORDER — PANTOPRAZOLE SODIUM 40 MG PO TBEC: 40.0000 mg | DELAYED_RELEASE_TABLET | Freq: Every day | ORAL | Status: DC

## 2020-08-05 MED ORDER — HEPARIN SODIUM (PORCINE) 5000 UNIT/ML IJ SOLN
5000.0000 [IU] | Freq: Three times a day (TID) | INTRAMUSCULAR | Status: DC
  Administered 2020-08-05 – 2020-08-06 (×5): 5000 [IU] via SUBCUTANEOUS
  Filled 2020-08-05 (×4): qty 1

## 2020-08-05 MED ORDER — ALBUTEROL SULFATE (2.5 MG/3ML) 0.083% IN NEBU
2.5000 mg | INHALATION_SOLUTION | Freq: Four times a day (QID) | RESPIRATORY_TRACT | Status: DC
  Administered 2020-08-05 – 2020-08-06 (×4): 2.5 mg via RESPIRATORY_TRACT
  Filled 2020-08-05 (×4): qty 3

## 2020-08-05 MED ORDER — SODIUM CHLORIDE 0.9 % IV SOLN
2.0000 g | INTRAVENOUS | Status: DC
  Administered 2020-08-06: 2 g via INTRAVENOUS
  Filled 2020-08-05: qty 2

## 2020-08-05 MED ORDER — SODIUM CHLORIDE 0.9 % IV SOLN: INTRAVENOUS | Status: DC

## 2020-08-05 MED ORDER — ONDANSETRON HCL 4 MG PO TABS: 4.0000 mg | ORAL_TABLET | Freq: Four times a day (QID) | ORAL | Status: DC | PRN

## 2020-08-05 MED ORDER — ONDANSETRON HCL 4 MG/2ML IJ SOLN: 4.0000 mg | Freq: Four times a day (QID) | INTRAMUSCULAR | Status: DC | PRN

## 2020-08-05 MED ORDER — MIRTAZAPINE 15 MG PO TABS: 7.5000 mg | ORAL_TABLET | Freq: Every day | ORAL | Status: DC

## 2020-08-05 MED ORDER — CHLORHEXIDINE GLUCONATE CLOTH 2 % EX PADS
6.0000 | MEDICATED_PAD | Freq: Every day | CUTANEOUS | Status: DC
  Administered 2020-08-05 – 2020-08-08 (×2): 6 via TOPICAL

## 2020-08-05 MED ORDER — METRONIDAZOLE IN NACL 5-0.79 MG/ML-% IV SOLN
500.0000 mg | Freq: Three times a day (TID) | INTRAVENOUS | Status: DC
  Administered 2020-08-05 – 2020-08-06 (×4): 500 mg via INTRAVENOUS
  Filled 2020-08-05 (×4): qty 100

## 2020-08-05 MED ORDER — PHENYLEPHRINE HCL (PRESSORS) 10 MG/ML IV SOLN
INTRAVENOUS | Status: AC
  Filled 2020-08-05: qty 1

## 2020-08-05 MED ORDER — PANTOPRAZOLE SODIUM 40 MG IV SOLR
40.0000 mg | INTRAVENOUS | Status: DC
  Administered 2020-08-05 – 2020-08-08 (×4): 40 mg via INTRAVENOUS
  Filled 2020-08-05 (×4): qty 40

## 2020-08-05 MED ORDER — ENSURE ENLIVE PO LIQD
237.0000 mL | Freq: Two times a day (BID) | ORAL | Status: DC
  Administered 2020-08-08: 237 mL via ORAL
  Filled 2020-08-05 (×8): qty 237

## 2020-08-05 MED ORDER — NOREPINEPHRINE 4 MG/250ML-% IV SOLN
0.0000 ug/min | INTRAVENOUS | Status: DC
  Administered 2020-08-05: 2 ug/min via INTRAVENOUS
  Administered 2020-08-05: 9 ug/min via INTRAVENOUS
  Administered 2020-08-05: 13 ug/min via INTRAVENOUS
  Administered 2020-08-06: 15 ug/min via INTRAVENOUS
  Administered 2020-08-06: 10 ug/min via INTRAVENOUS
  Administered 2020-08-06: 14 ug/min via INTRAVENOUS
  Filled 2020-08-05 (×6): qty 250

## 2020-08-05 MED ORDER — MORPHINE SULFATE (PF) 2 MG/ML IV SOLN
2.0000 mg | INTRAVENOUS | Status: DC | PRN
  Administered 2020-08-05 – 2020-08-06 (×2): 2 mg via INTRAVENOUS
  Filled 2020-08-05 (×2): qty 1

## 2020-08-05 NOTE — Progress Notes (Signed)
SLP Cancellation Note  Patient Details Name: Tracy Armstrong MRN: [id number] DOB: [birth year]   Cancelled treatment:       Reason Eval/Treat Not Completed: Patient's level of consciousness;Fatigue/lethargy limiting ability to participate. ST will follow for goals of care and will continue efforts. Thank you,  Ankith Edmonston H. Romie Levee, CCC-SLP Speech Language Pathologist  [name] 08/05/2020, 12:40 PM

## 2020-08-05 NOTE — Consult Note (Signed)
Aware of consult.  Awaiting further medical decisions concerning care given poor prognosis.

## 2020-08-05 NOTE — H&P (Signed)
TRH H&P    Patient Demographics:    Tracy Armstrong, is a 74 y.o. female  MRN: [id number]  DOB - 01-20-1946  Admit Date - 08/04/2020  Referring MD/NP/PA: Eulis Foster  Outpatient Primary MD for the patient is Neale Burly, MD  Patient coming from: Home  Chief complaint-Difficulty breathing   HPI:    Tracy Armstrong  is a 74 y.o. female, with history of multiple sclerosis and bilateral AKA's presents to the ED with her daughters for decreased responsiveness, and trouble breathing.  Daughter reports that patient has not been verbal, eating adequately, responding to them in 6 weeks.  She thinks the patient had a decrease in alertness 2 weeks ago, and she started having trouble breathing yesterday.  They have not noticed a cough.  They do not think the patient has had a fever at home.  She has not indicated any pain in any way, but she does not communicate.  They report that she has been drinking ensures, reluctantly.  No further history can be obtained . In the ED Temperature was 100.9, heart rate is 129, respiratory rate 26, oxygen sats are likely not accurate given low measurements at 33%, but a PO2 of 121 on ABG White blood cell count 8.6, hemoglobin 8.6 CHEM panel -sodium 151, potassium 4.9, BUN 46, creatinine 1.57, elevated alk phos at 134, elevated anion gap at 19, albumin is 1.7 Blood culture pending Covid and flu negative Chest x-ray shows right-sided pneumonia CT abdomen pelvis pending Broad spectrum antibiotics and fluid bolus in ED per sepsis protocol    Review of systems:    Review of Systems  Unable to perform ROS: Dementia       Past History of the following :    Past Medical History:  Diagnosis Date   Multiple sclerosis ([place]) 2009      Past Surgical History:  Procedure Laterality Date   AMPUTATION Bilateral 06/03/2020   Procedure: BILATERAL ABOVE KNEE AMPUTATIONS;  Surgeon: Newt Minion, MD;  Location: [facility];  Service: Orthopedics;  Laterality: Bilateral;   CHOLECYSTECTOMY        Social History:      Social History   Tobacco Use   Smoking status: Never Smoker   Smokeless tobacco: Never Used  Substance Use Topics   Alcohol use: No       Family History :    History reviewed. No pertinent family history. Could not review with patient as she is nonverbal   Home Medications:   Prior to Admission medications   Medication Sig Start Date End Date Taking? Authorizing Provider  docusate sodium (COLACE) 100 MG capsule Take 1 capsule (100 mg total) by mouth 2 (two) times daily. 06/14/20   Mariel Aloe, MD  feeding supplement, ENSURE ENLIVE, (ENSURE ENLIVE) LIQD Take 237 mLs by mouth 2 (two) times daily between meals. 06/14/20   Mariel Aloe, MD  mirtazapine (REMERON) 7.5 MG tablet Take 7.5 mg by mouth at bedtime. 05/02/20   [provider]  ondansetron (ZOFRAN) 4 MG tablet Take 4  mg by mouth every 6 (six) hours as needed for nausea/vomiting. 05/19/20   [provider]  pantoprazole (PROTONIX) 40 MG tablet Take 1 tablet (40 mg total) by mouth daily. 05/19/20   Elgergawy, Silver Huguenin, MD     Allergies:    No Known Allergies   Physical Exam:   Vitals  Blood pressure (!) 89/43, pulse 91, temperature (!) 100.9 F (38.3 C), temperature source Rectal, resp. rate (!) 22, SpO2 100 %.  1.  General: Patient lying supine in bed, from very frail appearing  2. Psychiatric: Cannot be assessed secondary to patient's nonverbal status  3. Neurologic: Patient not following commands, cannot be assessed Patient does report open eyes and react to painful stimuli of Covid swab  4. HEENMT:  Head is atraumatic, normocephalic, neck is cachectic, trachea is midline, mucous membranes are dry  5. Respiratory : Rhonchi in the lower lung field on the right, no rales or wheezing  6. Cardiovascular : Heart rate is tachycardic, rhythm is regular, no murmur rub  or gallop auscultated  7. Gastrointestinal:  Abdomen is flat, soft, no grimace to palpation  8. Skin:  Decubitus ulcer BL  9.Musculoskeletal:  Bilateral AKA    Data Review:    CBC Recent Labs  Lab 08/04/20 2210  WBC 8.6  HGB 8.6*  HCT 30.2*  PLT 82*  MCV 101.0*  MCH 28.8  MCHC 28.5*  RDW 20.2*  LYMPHSABS 0.8  MONOABS 0.1  EOSABS 0.0  BASOSABS 0.1   ------------------------------------------------------------------------------------------------------------------  Results for orders placed or performed during the hospital encounter of 08/04/20 (from the past 48 hour(s))  Lactic acid, plasma     Status: Abnormal   Collection Time: 08/04/20 10:10 PM  Result Value Ref Range   Lactic Acid, Venous >11 (HH) 0.5 - 1.9 mmol/L    Comment: CRITICAL RESULT CALLED TO, READ BACK BY AND VERIFIED WITH: BELTON,C ON 08/04/20 AT 2255 BY LOY,C Performed at [facility], [address] [place], [place] [address]   Comprehensive metabolic panel     Status: Abnormal   Collection Time: 08/04/20 10:10 PM  Result Value Ref Range   Sodium 151 (H) 135 - 145 mmol/L   Potassium 4.9 3.5 - 5.1 mmol/L   Chloride 113 (H) 98 - 111 mmol/L   CO2 19 (L) 22 - 32 mmol/L   Glucose, Bld 109 (H) 70 - 99 mg/dL    Comment: Glucose reference range applies only to samples taken after fasting for at least 8 hours.   BUN 46 (H) 8 - 23 mg/dL   Creatinine, Ser 1.57 (H) 0.44 - 1.00 mg/dL   Calcium 8.9 8.9 - 10.3 mg/dL   Total Protein 6.2 (L) 6.5 - 8.1 g/dL   Albumin 1.7 (L) 3.5 - 5.0 g/dL   AST 49 (H) 15 - 41 U/L   ALT <5 0 - 44 U/L   Alkaline Phosphatase 134 (H) 38 - 126 U/L   Total Bilirubin 0.6 0.3 - 1.2 mg/dL   GFR calc non Af Amer 33 (L) >60 mL/min   GFR calc Af Amer 38 (L) >60 mL/min   Anion gap 19 (H) 5 - 15    Comment: Performed at [facility], [address], [place], [place] [address]  CBC WITH DIFFERENTIAL     Status: Abnormal   Collection Time: 08/04/20 10:10 PM  Result Value Ref Range   WBC  8.6 4.0 - 10.5 K/uL   RBC 2.99 (L) 3.87 - 5.11 MIL/uL   Hemoglobin 8.6 (L)  12.0 - 15.0 g/dL   HCT 30.2 (L) 36 - 46 %   MCV 101.0 (H) 80.0 - 100.0 fL   MCH 28.8 26.0 - 34.0 pg   MCHC 28.5 (L) 30.0 - 36.0 g/dL   RDW 20.2 (H) 11.5 - 15.5 %   Platelets 82 (L) 150 - 400 K/uL    Comment: Immature Platelet Fraction may be clinically indicated, consider ordering this additional test [id number]    nRBC 3.9 (H) 0.0 - 0.2 %   Neutrophils Relative % 87 %   Neutro Abs 7.6 1.7 - 7.7 K/uL   Lymphocytes Relative 10 %   Lymphs Abs 0.8 0.7 - 4.0 K/uL   Monocytes Relative 1 %   Monocytes Absolute 0.1 0 - 1 K/uL   Eosinophils Relative 0 %   Eosinophils Absolute 0.0 0 - 0 K/uL   Basophils Relative 1 %   Basophils Absolute 0.1 0 - 0 K/uL   Immature Granulocytes 1 %   Abs Immature Granulocytes 0.12 (H) 0.00 - 0.07 K/uL    Comment: Performed at [facility], [address] [place], [place] [address]  Protime-INR     Status: Abnormal   Collection Time: 08/04/20 10:10 PM  Result Value Ref Range   Prothrombin Time 18.1 (H) 11.4 - 15.2 seconds   INR 1.6 (H) 0.8 - 1.2    Comment: (NOTE) INR goal varies based on device and disease states. Performed at [facility], [address], [place], [place] [address]   APTT     Status: None   Collection Time: 08/04/20 10:10 PM  Result Value Ref Range   aPTT 33 24 - 36 seconds    Comment: Performed at [facility], [address] [place], [place] [address]  Troponin I (High Sensitivity)     Status: Abnormal   Collection Time: 08/04/20 10:10 PM  Result Value Ref Range   Troponin I (High Sensitivity) 102 (HH) <18 ng/L    Comment: CRITICAL RESULT CALLED TO, READ BACK BY AND VERIFIED WITH: C TURNER,RN$RemoveBeforeDE'@2332'[id number]$  08/04/20 MKELLY (NOTE) Elevated high sensitivity troponin I (hsTnI) values and significant  changes across serial measurements may suggest ACS but many other  chronic and acute conditions are known to elevate hsTnI results.  Refer to the Links section for  chest pain algorithms and additional  guidance. Performed at [facility] [address] [place], [place] [address]   Blood gas, arterial (at [facility])     Status: Abnormal   Collection Time: 08/05/20 12:23 AM  Result Value Ref Range   FIO2 40.00    Delivery systems NASAL CANNULA    pH, Arterial 7.283 (L) 7.35 - 7.45   pCO2 arterial 35.9 32 - 48 mmHg   pO2, Arterial 121 (H) 83 - 108 mmHg   Bicarbonate 17.1 (L) 20.0 - 28.0 mmol/L   Acid-base deficit 9.0 (H) 0.0 - 2.0 mmol/L   O2 Saturation 98.0 %   Patient temperature 38.2    Collection site RIGHT RADIAL    Drawn by [id number]    Allens test (pass/fail) PASS PASS    Comment: Performed at [facility], [address] [place], [place] [address]  Respiratory Panel by RT PCR (Flu A&B, Covid) -     Status: None   Collection Time: 08/05/20 12:50 AM  Result Value Ref Range   SARS Coronavirus 2 by RT PCR NEGATIVE NEGATIVE    Comment: (NOTE) SARS-CoV-2 target nucleic acids are NOT DETECTED.  The SARS-CoV-2 RNA is generally detectable in upper  respiratoy specimens during the acute phase of infection. The lowest concentration of SARS-CoV-2 viral copies this assay can detect is 131 copies/mL. A negative result does not preclude SARS-Cov-2 infection and should not be used as the sole basis for treatment or other patient management decisions. A negative result may occur with  improper specimen collection/handling, submission of specimen other than nasopharyngeal swab, presence of viral mutation(s) within the areas targeted by this assay, and inadequate number of viral copies (<131 copies/mL). A negative result must be combined with clinical observations, patient history, and epidemiological information. The expected result is Negative.  Fact Sheet for Patients:  PinkCheek.be  Fact Sheet for Healthcare Providers:  GravelBags.it  This test is no t yet approved or cleared by the [place] FDA and  has been authorized for detection and/or diagnosis of SARS-CoV-2 by FDA under an Emergency Use Authorization (EUA). This EUA will remain  in effect (meaning this test can be used) for the duration of the COVID-19 declaration under Section 564(b)(1) of the Act, 21 U.S.C. section 360bbb-3(b)(1), unless the authorization is terminated or revoked sooner.     Influenza A by PCR NEGATIVE NEGATIVE   Influenza B by PCR NEGATIVE NEGATIVE    Comment: (NOTE) The Xpert Xpress SARS-CoV-2/FLU/RSV assay is intended as an aid in  the diagnosis of influenza from Nasopharyngeal swab specimens and  should not be used as a sole basis for treatment. Nasal washings and  aspirates are unacceptable for Xpert Xpress SARS-CoV-2/FLU/RSV  testing.  Fact Sheet for Patients: PinkCheek.be  Fact Sheet for Healthcare Providers: GravelBags.it  This test is not yet approved or cleared by the [place] FDA and  has been authorized for detection and/or diagnosis of SARS-CoV-2 by  FDA under an Emergency Use Authorization (EUA). This EUA will remain  in effect (meaning this test can be used) for the duration of the  Covid-19 declaration under Section 564(b)(1) of the Act, 21  U.S.C. section 360bbb-3(b)(1), unless the authorization is  terminated or revoked. Performed at [facility], [address], [place], [place] [address]     Chemistries  Recent Labs  Lab 08/04/20 2210  NA 151*  K 4.9  CL 113*  CO2 19*  GLUCOSE 109*  BUN 46*  CREATININE 1.57*  CALCIUM 8.9  AST 49*  ALT <5  ALKPHOS 134*  BILITOT 0.6   ------------------------------------------------------------------------------------------------------------------  ------------------------------------------------------------------------------------------------------------------ GFR: CrCl cannot be calculated (Unknown ideal weight.). Liver Function Tests: Recent Labs   Lab 08/04/20 2210  AST 49*  ALT <5  ALKPHOS 134*  BILITOT 0.6  PROT 6.2*  ALBUMIN 1.7*   No results for input(s): LIPASE, AMYLASE in the last 168 hours. No results for input(s): AMMONIA in the last 168 hours. Coagulation Profile: Recent Labs  Lab 08/04/20 2210  INR 1.6*   Cardiac Enzymes: No results for input(s): CKTOTAL, CKMB, CKMBINDEX, TROPONINI in the last 168 hours. BNP (last 3 results) No results for input(s): PROBNP in the last 8760 hours. HbA1C: No results for input(s): HGBA1C in the last 72 hours. CBG: No results for input(s): GLUCAP in the last 168 hours. Lipid Profile: No results for input(s): CHOL, HDL, LDLCALC, TRIG, CHOLHDL, LDLDIRECT in the last 72 hours. Thyroid Function Tests: No results for input(s): TSH, T4TOTAL, FREET4, T3FREE, THYROIDAB in the last 72 hours. Anemia Panel: No results for input(s): VITAMINB12, FOLATE, FERRITIN, TIBC, IRON, RETICCTPCT in the last 72 hours.  --------------------------------------------------------------------------------------------------------------- Urine analysis:    Component Value Date/Time   COLORURINE YELLOW 07/19/2011 1232  APPEARANCEUR TURBID (A) 07/19/2011 1232   LABSPEC 1.025 07/19/2011 1232   PHURINE 7.0 07/19/2011 1232   GLUCOSEU NEGATIVE 07/19/2011 1232   HGBUR MODERATE (A) 07/19/2011 1232   BILIRUBINUR NEGATIVE 07/19/2011 1232   KETONESUR 40 (A) 07/19/2011 1232   PROTEINUR NEGATIVE 07/19/2011 1232   UROBILINOGEN 0.2 07/19/2011 1232   NITRITE NEGATIVE 07/19/2011 1232   LEUKOCYTESUR MODERATE (A) 07/19/2011 1232      Imaging Results:    DG Chest Port 1 View  Result Date: 08/04/2020 CLINICAL DATA:  Code sepsis altered low blood pressure EXAM: PORTABLE CHEST 1 VIEW COMPARISON:  05/31/2020, 05/13/2020 FINDINGS: Left lung is grossly clear. Patchy vague airspace opacities in the right mid lung and lung base. No pleural effusion. Normal heart size. No pneumothorax. IMPRESSION: Patchy vague airspace  opacities in the right mid lung and lung base, suspicious for pneumonia. Electronically Signed   By: Donavan Foil M.D.   On: 08/04/2020 21:41   DG Chest Port 1V same Day  Result Date: 08/04/2020 CLINICAL DATA:  Central line placement. History of multiple sclerosis. EXAM: PORTABLE CHEST 1 VIEW COMPARISON:  08/04/2020 FINDINGS: Heart size and pulmonary vascularity are normal. Right central venous catheter with tip over the cavoatrial junction. Peribronchial thickening with patchy perihilar opacities on the right. This may represent bronchitis or pneumonia. No significant change since previous study, allowing for differences in technique. No pleural effusions. No pneumothorax. Mediastinal contours are normal. Degenerative changes in the spine and shoulders. Surgical clips in the right upper quadrant. IMPRESSION: 1. Right central venous catheter with tip over the cavoatrial junction. No pneumothorax. 2. Peribronchial thickening with patchy perihilar opacities on the right suggesting bronchitis or pneumonia. Electronically Signed   By: Lucienne Capers M.D.   On: 08/04/2020 22:40    My personal review of EKG: Rhythm NSR, Rate 111 /min, QTc 634 , diffuse repolarization abnormalities, likely ischemia, trending troponin   Assessment & Plan:    Active Problems:   Sepsis ([place])   1. Septic shock-possible source pneumonia 1. Temperature 100.9, heart rate 129, respiratory rate 26 2. Chest x-ray shows possible pneumonia 3. ED provider consulted intensivist at [facility] who recommended also getting a CT of abdomen to explore possibility of other sources 4. CT abdomen pending 5. Lactic acidosis greater than 11 6. Patient was started on cefepime, Vanco 7. Sepsis order set utilized 8. Blood cultures pending 9. Urine culture pending 10. Sputum culture pending 11. Full fluid bolus administered 12. Pressures remain low, right subclavian central line placed, started on Neo-Synephrine 13. Patient on  pressors 14. Continue to monitor 2. Community-acquired pneumonia 1. Continue cefepime, Vanco, Flagyl 2. Sputum culture pending 3. Blood culture pending 4. Speech eval for possible aspiration 5. Continue to monitor 3. Decubitus ulcers 1. Over sacrum 2. Could also be contributing to sepsis 3. Covered with broad-spectrum antibiotics 4. Wound care consult 5. Continue to monitor 4. Respiratory failure with hypoxia 1. Minute O2 sats down to 33% 2. ABG shows a PCO2 of 121 3. Continue oxygen supplementation 4. Wean off as tolerated 5. Secondary to pneumonia most likely 6. Continue to monitor 5. Lactic acidosis 1. Lactic acid is greater than 11 2. Trend 3. CT abdomen pending 6. AKI 1. Baseline creatinine is 0.45 2. Creatinine now is 1.57 3. Prerenal given dehydration, hypotension 4. Repeat in the a.m. 7. Altered mental status 1. Unclear if family has realistic idea about patient's baseline 2. At baseline they report that she is not talking 3. They report she has not  eaten adequately in 6 weeks, and has been living off of Ensure 4. Today the altered part of her mental status was "hazy eyes" 5. Secondary to sepsis, hypoxia, progression of dementia 6. Continue to monitor 8. Prolonged QTC 1. QTC 634 2. Monitor on telemetry 3. Avoid QT prolonging agents when possible 9. Hypernatremia 1. Sodium 151 2. Secondary to dehydration 3. Fluids given in ED 4. Recheck in the a.m. 10. Protein calorie malnutrition 1. Albumin is 1.7 2. Secondary to poor p.o. intake 3. Continue feeding supplements if patient becomes alert enough to take food by mouth 11. Elevated troponin 1. Likely demand ischemia 2. Trend 3. Monitor on telemetry   DVT Prophylaxis-   Heparin- SCDs   AM Labs Ordered, also please review Full Orders  Family Communication: Admission, patients condition and plan of care including tests being ordered have been discussed with the patient and daughters Maudie Mercury and Claiborne Billings and  granddaughter Kia who indicate understanding and agree with the plan and Code Status.  Code Status: DNR  Admission status: Inpatient :The appropriate admission status for this patient is INPATIENT. Inpatient status is judged to be reasonable and necessary in order to provide the required intensity of service to ensure the patient's safety. The patient's presenting symptoms, physical exam findings, and initial radiographic and laboratory data in the context of their chronic comorbidities is felt to place them at high risk for further clinical deterioration. Furthermore, it is not anticipated that the patient will be medically stable for discharge from the hospital within 2 midnights of admission. The following factors support the admission status of inpatient.     The patient's presenting symptoms include altered mental status, hypoxia The worrisome physical exam findings include frail, dehydrated, hypoxic The initial radiographic and laboratory data are worrisome because of chest x-ray shows peribronchial thickening with patchy perihilar opacities on the right suggesting bronchitis or pneumonia The chronic co-morbidities include history of multiple sclerosis, bilateral AKA's, dementia       * I certify that at the point of admission it is my clinical judgment that the patient will require inpatient hospital care spanning beyond 2 midnights from the point of admission due to high intensity of service, high risk for further deterioration and high frequency of surveillance required.*  Time spent in minutes : [place]

## 2020-08-05 NOTE — Consult Note (Addendum)
WOC Nurse Consult Note: Reason for Consult: Consult requested for sacrum wound.  Performed remotely after review of progress notes and photos in the EMR.  Wound type: Pt has significant wounds and medical condition is consistent with possible sepsis.  CT results indicate; "Bilateral ischial decubitus ulcers with associated underlying osteomyelitis. 2. 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 These complex medical conditions are beyond the scope of practice for WOC nursing; please consult the surgical team for further plan of care. Secure chat message sent to primary team with this information.  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 Right ischium with Unstageable pressure injury; 50% red, 50% loose yellow slough, mod amt tan drainage Sacrum with stage 4 pressure injury; 15% yellow slough, 85% red and moist, mod amt tan drainage Middle back with 2 areas of Unstageable pressure injury; each with 100% eschar tightly adhered.  Dressing procedure/placement/frequency: Air mattress order requested to reduce pressure. Topical treatment orders provided for bedside nurse to perform daily as follows to provide enzymatic debridement: Pack left ischium with moist gauze Q day, then cover with ABD pad and tape.  Apply Santyl to middle back (2 wounds) and right ischium wound and sacrum wound Q day, then cover with moist gauze and ABD pad and tape. Please re-consult if further assistance is needed.  Thank-you,  Cammie Mcgee MSN, RN, CWOCN, [place], CNS [phone]

## 2020-08-05 NOTE — Progress Notes (Signed)
Palliative consult received 10/1 at 1619 and case discussed in detail with primary service. Patient has a very poor prognosis and will likely not survive this hospitalization. Family aware and they were agreeable to the current plan of care which is to treat in ICU for 24 hours and if no signs of improvement transition to comfort care. Patient has DNR order in place. Patient has orders for PRN morphine for her pain and discomfort- please pre-medicate before wound care or repositioning.  If there are additional needs today or over the weekend please feel free to contact our team at [phone].  Anderson Malta, DO Palliative Medicine

## 2020-08-05 NOTE — Progress Notes (Signed)
Patient seen and examined.  Admitted after midnight secondary to severe sepsis with septic shock, in the setting of aspiration pneumonia and bilateral ischial osteomyelitis.  Unable to have conversations; oriented x1 currently.  High risk for aspiration due to altered mentation.  Requiring pressors to assist maintaining BP.  Cultures has been taken and patient receiving broad-spectrum antibiotics.  General surgery has been contacted to assess patient and determine if debridement indicated at this time.  Very extensive and abnormal ongoing wound process. Patient is DNR, but with full spectrum of practice. Please refer to H&P written by Dr. Carren Rang for further info/details on admission.  Plan: -will continue broad spectrum antibiotics -Follow local care as recommended by wound care services -Follow general surgery recommendations for need of debridement -Very poor prognosis. -Daughter updated at bedside -continue supportive care and follow clinical response.  Vassie Loll MD [phone]

## 2020-08-05 NOTE — ED Notes (Signed)
Attempted to feed pt breakfast. Pt would not open mouth for food or drink keeps repeated "no."

## 2020-08-06 DIAGNOSIS — N179 Acute kidney failure, unspecified: Secondary | ICD-10-CM

## 2020-08-06 DIAGNOSIS — Z66 Do not resuscitate: Secondary | ICD-10-CM | POA: Diagnosis not present

## 2020-08-06 DIAGNOSIS — L8995 Pressure ulcer of unspecified site, unstageable: Secondary | ICD-10-CM | POA: Diagnosis not present

## 2020-08-06 DIAGNOSIS — A419 Sepsis, unspecified organism: Secondary | ICD-10-CM | POA: Diagnosis not present

## 2020-08-06 LAB — URINE CULTURE: Culture: 30000 — AB

## 2020-08-06 LAB — MRSA PCR SCREENING: MRSA by PCR: NEGATIVE

## 2020-08-06 MED ORDER — ALBUTEROL SULFATE (2.5 MG/3ML) 0.083% IN NEBU: 2.5000 mg | INHALATION_SOLUTION | RESPIRATORY_TRACT | Status: DC | PRN

## 2020-08-06 MED ORDER — ACETAMINOPHEN 650 MG RE SUPP: 650.0000 mg | Freq: Four times a day (QID) | RECTAL | Status: DC | PRN

## 2020-08-06 MED ORDER — LORAZEPAM 1 MG PO TABS: 1.0000 mg | ORAL_TABLET | ORAL | Status: DC | PRN

## 2020-08-06 MED ORDER — LORAZEPAM 2 MG/ML PO CONC: 1.0000 mg | ORAL | Status: DC | PRN

## 2020-08-06 MED ORDER — GLYCOPYRROLATE 0.2 MG/ML IJ SOLN: 0.2000 mg | INTRAMUSCULAR | Status: DC | PRN

## 2020-08-06 MED ORDER — GLYCOPYRROLATE 1 MG PO TABS: 1.0000 mg | ORAL_TABLET | ORAL | Status: DC | PRN

## 2020-08-06 MED ORDER — HALOPERIDOL 0.5 MG PO TABS: 0.5000 mg | ORAL_TABLET | ORAL | Status: DC | PRN

## 2020-08-06 MED ORDER — SODIUM CHLORIDE 0.9% FLUSH: 3.0000 mL | Freq: Two times a day (BID) | INTRAVENOUS | Status: DC

## 2020-08-06 MED ORDER — POLYVINYL ALCOHOL 1.4 % OP SOLN: 1.0000 [drp] | Freq: Four times a day (QID) | OPHTHALMIC | Status: DC | PRN

## 2020-08-06 MED ORDER — ACETAMINOPHEN 325 MG PO TABS: 650.0000 mg | ORAL_TABLET | Freq: Four times a day (QID) | ORAL | Status: DC | PRN

## 2020-08-06 MED ORDER — HALOPERIDOL LACTATE 5 MG/ML IJ SOLN: 0.5000 mg | INTRAMUSCULAR | Status: DC | PRN

## 2020-08-06 MED ORDER — SODIUM CHLORIDE 0.9 % IV SOLN: 250.0000 mL | INTRAVENOUS | Status: DC | PRN

## 2020-08-06 MED ORDER — BIOTENE DRY MOUTH MT LIQD: 15.0000 mL | OROMUCOSAL | Status: DC | PRN

## 2020-08-06 MED ORDER — HALOPERIDOL LACTATE 2 MG/ML PO CONC: 0.5000 mg | ORAL | Status: DC | PRN

## 2020-08-06 MED ORDER — SODIUM CHLORIDE 0.9% FLUSH: 3.0000 mL | INTRAVENOUS | Status: DC | PRN

## 2020-08-06 MED ORDER — LORAZEPAM 2 MG/ML IJ SOLN: 1.0000 mg | INTRAMUSCULAR | Status: DC | PRN

## 2020-08-06 NOTE — Progress Notes (Addendum)
PROGRESS NOTE    Tracy Armstrong  DPO:[id number] DOB: April 19, 1946 DOA: 08/04/2020 PCP: Neale Burly, MD    Chief Complaint  Patient presents with  . Altered Mental Status    Brief Narrative:  As per H&P written by Dr. Clearence Ped on 08/05/20 Tracy Armstrong  is a 74 y.o. female, with history of multiple sclerosis and bilateral AKA's presents to the ED with her daughters for decreased responsiveness, and trouble breathing.  Daughter reports that patient has not been verbal, eating adequately, responding to them in 6 weeks.  She thinks the patient had a decrease in alertness 2 weeks ago, and she started having trouble breathing yesterday.  They have not noticed a cough.  They do not think the patient has had a fever at home.  She has not indicated any pain in any way, but she does not communicate.  They report that she has been drinking ensures, reluctantly.  No further history can be obtained . In the ED Temperature was 100.9, heart rate is 129, respiratory rate 26, oxygen sats are likely not accurate given low measurements at 33%, but a PO2 of 121 on ABG White blood cell count 8.6, hemoglobin 8.6 CHEM panel -sodium 151, potassium 4.9, BUN 46, creatinine 1.57, elevated alk phos at 134, elevated anion gap at 19, albumin is 1.7 Blood culture pending Covid and flu negative Chest x-ray shows right-sided pneumonia CT abdomen pelvis pending Broad spectrum antibiotics and fluid bolus in ED per sepsis protocol   Assessment & Plan: 1-severe sepsis with septic shock: In the setting of aspiration pneumonia, Streptococcus bacteremia and bilateral ischial osteomyelitis -POA -Despite fluid resuscitation, pressor support and broad-spectrum antibiotics patient continue to be critical and her pressure failed to response without the assistance on pressors -After long discussion with family members they understood that any intervention despite how invasive it is will end up being futile to patient's  condition and long-term prognosis. -Decision was made to transition to full comfort and symptomatic management only -Antibiotics and pressure will be discontinued -End-of-life care protocol will be initiated -Anticipate hospital death; but overall life expectancy is less than 6 weeks.. -Family in agreement and aware that if the patient condition plateau they would like to pursuit residential hospice placement.  2-metabolic encephalopathy/lactic acidosis -In the setting of poor organ perfusion with ongoing septic shock and infection -Plan is for full comfort care. -Patient has remained obtunded despite maximal effort and treatment provided.  3-no unstageable decubitus/sacral pressure ulcers: POA -With CT scan demonstrating osteomyelitis in bilateral ischium -Will continue constant repositioning for comfort purposes -End-of-life care protocol. -Case discussed with surgical surgery who fell no point of debridement at this time; as intervention will be painful unable to be overall futile to patient current condition and prognosis.Marland Kitchen  4-acute kidney injury -In the setting of poor perfusion and hypotension from septic shock -Patient received aggressive fluid resuscitation -At this time no further blood work will be drawn -Will focus on comfort care.  5-hypernatremia -In the setting of poor oral intake and dehydration -Fluid resuscitation was given -At this time moving to comfort care and symptomatic management only -No further blood work will be drawn.  6-severe protein calorie malnutrition -Comfort care -Patient albumin 1.7  7-elevated troponin and presentation -Most likely demand ischemia -No acute ischemic changes appreciated on EKG or telemetry. -We will focus on comfort care -MedSurg bed requested.  8-toxic encephalopathy -Patient has remained obtunded -Will focus on comfort care as per goals of care discussion with patient.  9-DNR/DNI -Long goals of care discussion with  family members at bedside -End-of-life protocol initiated -Extremely poor prognosis -Will focus on comfort care only.  DVT prophylaxis: None (comfort care only). Code Status: DNR/DNI Family Communication: Daughters is at bedside Disposition:   Status is: Inpatient  Dispo: The patient is from: Home              Anticipated d/c is to: Hospice home if patient plateau and not die in the hospital              Anticipated d/c date is: to be determined (but hospital death anticipated)              Patient currently is not medically stable for discharge; extremely poor prognosis and anticipated hospital death; after long discussion with family members at bedside decision has been made to pursuit comfort and symptomatic management only; pressors support will be discontinue, antibiotics will be stopped, patient DNR/DNI and with transition to end-of-life care protocol.  If stable can be transfer to hospice home if bed available.       Consultants:   Palliative care  Hospice  General surgery   Procedures:  See below for x-ray reports.   Antimicrobials:  Cefepime, vancomycin and Flagyl: At this moment all antibiotics will be discontinued and patient will be transition to full comfort.   Subjective: Obtunded, minimally responsive; no eating, not drinking.  Requiring high amount of Levophed to maintain pressures and with remaining elevated lactic acid.  Objective: Vitals:   08/06/20 0645 08/06/20 0700 08/06/20 0800 08/06/20 0915  BP: (!) 101/57 (!) 99/58 118/64 (!) 134/108  Pulse: 95 96 95   Resp: _0 Temp:      TempSrc:      SpO2: 100% 100% 100%   Weight:      Height:        Intake/Output Summary (Last 24 hours) at 08/06/2020 1445 Last data filed at 08/06/2020 5784 Gross per 24 hour  Intake 1585.48 ml  Output --  Net 1585.48 ml   Filed Weights   08/05/20 1720 08/06/20 0400  Weight: 40.8 kg 43.6 kg    Examination: General exam: Nontalkative; moaning every  time that she is reposition or is touched around her wounds; requiring high level of pressure supplementation and currently no eating or drinking. Respiratory system: Clear to auscultation. Respiratory effort normal. Cardiovascular system: S1 and S2; no rubs, no gallops, no JVD. Gastrointestinal system: Abdomen is nondistended, soft and nontender. No organomegaly or masses felt. Normal bowel sounds heard. Central nervous system: Unable to properly assess; patient unable to follow commands or to answer any questions currently. Extremities: Bilateral AKA Skin: Multiple unstageable wounds affecting her sacral/ischial area.  Also with a stage III midthoracic wound without signs of superimposed infection. Psychiatry: Unable to answer any questions or follow commands currently.    Data Reviewed: I have personally reviewed following labs and imaging studies  CBC: Recent Labs  Lab 08/04/20 2210 08/05/20 0335  WBC 8.6 15.7*  NEUTROABS 7.6 14.2*  HGB 8.6* 7.3*  HCT 30.2* 24.9*  MCV 101.0* 98.8  PLT 82* 73*    Basic Metabolic Panel: Recent Labs  Lab 08/04/20 2210 08/05/20 0335  NA 151* 146*  K 4.9 4.0  CL 113* 113*  CO2 19* 19*  GLUCOSE 109* 127*  BUN 46* 40*  CREATININE 1.57* 1.25*  CALCIUM 8.9 8.0*  MG  --  1.6*  PHOS  --  2.4*    GFR: Estimated  Creatinine Clearance: 7.4 mL/min (A) (by C-G formula based on SCr of 1.25 mg/dL (H)).  Liver Function Tests: Recent Labs  Lab 08/04/20 2210 08/05/20 0335  AST 49* 187*  ALT <5 48*  ALKPHOS 134* 104  BILITOT 0.6 0.7  PROT 6.2* 4.7*  ALBUMIN 1.7* 1.3*    CBG: No results for input(s): GLUCAP in the last 168 hours.   Recent Results (from the past 240 hour(s))  Blood Culture (routine x 2)     Status: Abnormal (Preliminary result)   Collection Time: 08/04/20 10:10 PM   Specimen: BLOOD  Result Value Ref Range Status   Specimen Description   Final    BLOOD CENTRAL LINE Performed at [facility], [address]  [place], [place] [address]    Special Requests   Final    BOTTLES DRAWN AEROBIC AND ANAEROBIC Blood Culture adequate volume Performed at [facility] [place], [place] [address]    Culture  Setup Time   Final    GRAM POSITIVE COCCI IN CHAINS ANAEROBIC BOTTLE ONLY Gram Stain Report Called to,Read Back By and Verified With: FOLEY,B AT 3295 ON 08/05/20 BY HUFFINES,S. CRITICAL RESULT CALLED TO, READ BACK BY AND VERIFIED WITH: B HARRIS RN 2022 08/05/20 A BROWNING    Culture (A)  Final    GROUP B STREP(S.AGALACTIAE)ISOLATED SUSCEPTIBILITIES TO FOLLOW Performed at [facility], [place] [address] [place], [place] [address]    Report Status PENDING  Incomplete  Blood Culture ID Panel (Reflexed)     Status: Abnormal   Collection Time: 08/04/20 10:10 PM  Result Value Ref Range Status   Enterococcus faecalis NOT DETECTED NOT DETECTED Final   Enterococcus Faecium NOT DETECTED NOT DETECTED Final   Listeria monocytogenes NOT DETECTED NOT DETECTED Final   Staphylococcus species NOT DETECTED NOT DETECTED Final   Staphylococcus aureus (BCID) NOT DETECTED NOT DETECTED Final   Staphylococcus epidermidis NOT DETECTED NOT DETECTED Final   Staphylococcus lugdunensis NOT DETECTED NOT DETECTED Final   Streptococcus species DETECTED (A) NOT DETECTED Final    Comment: CRITICAL RESULT CALLED TO, READ BACK BY AND VERIFIED WITH: B HARRIS RN 2022 08/05/20 A BROWNING    Streptococcus agalactiae DETECTED (A) NOT DETECTED Final    Comment: CRITICAL RESULT CALLED TO, READ BACK BY AND VERIFIED WITH: B HARRIS RN 2022 08/05/20 A BROWNING    Streptococcus pneumoniae NOT DETECTED NOT DETECTED Final   Streptococcus pyogenes NOT DETECTED NOT DETECTED Final   A.calcoaceticus-baumannii NOT DETECTED NOT DETECTED Final   Bacteroides fragilis NOT DETECTED NOT DETECTED Final   Enterobacterales NOT DETECTED NOT DETECTED Final   Enterobacter cloacae complex NOT DETECTED NOT DETECTED Final   Escherichia coli NOT  DETECTED NOT DETECTED Final   Klebsiella aerogenes NOT DETECTED NOT DETECTED Final   Klebsiella oxytoca NOT DETECTED NOT DETECTED Final   Klebsiella pneumoniae NOT DETECTED NOT DETECTED Final   Proteus species NOT DETECTED NOT DETECTED Final   Salmonella species NOT DETECTED NOT DETECTED Final   Serratia marcescens NOT DETECTED NOT DETECTED Final   Haemophilus influenzae NOT DETECTED NOT DETECTED Final   Neisseria meningitidis NOT DETECTED NOT DETECTED Final   Pseudomonas aeruginosa NOT DETECTED NOT DETECTED Final   Stenotrophomonas maltophilia NOT DETECTED NOT DETECTED Final   Candida albicans NOT DETECTED NOT DETECTED Final   Candida auris NOT DETECTED NOT DETECTED Final   Candida glabrata NOT DETECTED NOT DETECTED Final   Candida krusei NOT DETECTED NOT DETECTED Final   Candida parapsilosis NOT DETECTED NOT  DETECTED Final   Candida tropicalis NOT DETECTED NOT DETECTED Final   Cryptococcus neoformans/gattii NOT DETECTED NOT DETECTED Final    Comment: Performed at [facility], [place] [address] [place], [place] [address]  Respiratory Panel by RT PCR (Flu A&B, Covid) -     Status: None   Collection Time: 08/05/20 12:50 AM  Result Value Ref Range Status   SARS Coronavirus 2 by RT PCR NEGATIVE NEGATIVE Final    Comment: (NOTE) SARS-CoV-2 target nucleic acids are NOT DETECTED.  The SARS-CoV-2 RNA is generally detectable in upper respiratoy specimens during the acute phase of infection. The lowest concentration of SARS-CoV-2 viral copies this assay can detect is 131 copies/mL. A negative result does not preclude SARS-Cov-2 infection and should not be used as the sole basis for treatment or other patient management decisions. A negative result may occur with  improper specimen collection/handling, submission of specimen other than nasopharyngeal swab, presence of viral mutation(s) within the areas targeted by this assay, and inadequate number of viral copies (<131 copies/mL). A  negative result must be combined with clinical observations, patient history, and epidemiological information. The expected result is Negative.  Fact Sheet for Patients:  PinkCheek.be  Fact Sheet for Healthcare Providers:  GravelBags.it  This test is no t yet approved or cleared by the [place] FDA and  has been authorized for detection and/or diagnosis of SARS-CoV-2 by FDA under an Emergency Use Authorization (EUA). This EUA will remain  in effect (meaning this test can be used) for the duration of the COVID-19 declaration under Section 564(b)(1) of the Act, 21 U.S.C. section 360bbb-3(b)(1), unless the authorization is terminated or revoked sooner.     Influenza A by PCR NEGATIVE NEGATIVE Final   Influenza B by PCR NEGATIVE NEGATIVE Final    Comment: (NOTE) The Xpert Xpress SARS-CoV-2/FLU/RSV assay is intended as an aid in  the diagnosis of influenza from Nasopharyngeal swab specimens and  should not be used as a sole basis for treatment. Nasal washings and  aspirates are unacceptable for Xpert Xpress SARS-CoV-2/FLU/RSV  testing.  Fact Sheet for Patients: PinkCheek.be  Fact Sheet for Healthcare Providers: GravelBags.it  This test is not yet approved or cleared by the [place] FDA and  has been authorized for detection and/or diagnosis of SARS-CoV-2 by  FDA under an Emergency Use Authorization (EUA). This EUA will remain  in effect (meaning this test can be used) for the duration of the  Covid-19 declaration under Section 564(b)(1) of the Act, 21  U.S.C. section 360bbb-3(b)(1), unless the authorization is  terminated or revoked. Performed at [facility], [address], [place], [place] [address]   Blood Culture (routine x 2)     Status: None (Preliminary result)   Collection Time: 08/05/20  3:35 AM   Specimen: BLOOD  Result Value Ref Range Status    Specimen Description BLOOD PICC LINE  Final   Special Requests   Final    BOTTLES DRAWN AEROBIC AND ANAEROBIC Blood Culture adequate volume   Culture   Final    NO GROWTH 1 DAY Performed at [facility], [address], [place], [place] [address]    Report Status PENDING  Incomplete  Urine culture     Status: Abnormal   Collection Time: 08/05/20 11:37 AM   Specimen: Urine, Catheterized  Result Value Ref Range Status   Specimen Description   Final    URINE, CATHETERIZED Performed at [facility], [address], [place], [place] [address]  Special Requests   Final    NONE Performed at [facility], [address] [place], [place] [address]    Culture 30,000 COLONIES/mL YEAST (A)  Final   Report Status 08/06/2020 FINAL  Final  MRSA PCR Screening     Status: None   Collection Time: 08/05/20  3:30 PM   Specimen: Nasopharyngeal  Result Value Ref Range Status   MRSA by PCR NEGATIVE NEGATIVE Final    Comment:        The GeneXpert MRSA Assay (FDA approved for NASAL specimens only), is one component of a comprehensive MRSA colonization surveillance program. It is not intended to diagnose MRSA infection nor to guide or monitor treatment for MRSA infections. Performed at [facility], [address], [place], [place] [address]      Radiology Studies: CT ABDOMEN PELVIS WO CONTRAST  Result Date: 08/05/2020 CLINICAL DATA:  Abdominal pain.  Decreased responsiveness. EXAM: CT ABDOMEN AND PELVIS WITHOUT CONTRAST TECHNIQUE: Multidetector CT imaging of the abdomen and pelvis was performed following the standard protocol without IV contrast. COMPARISON:  07/19/2011 FINDINGS: LOWER CHEST: There are bilateral basilar endobronchial opacities, right worse than left. HEPATOBILIARY: No focal abnormality of the liver. Status post cholecystectomy. PANCREAS: Normal pancreas. No ductal dilatation or peripancreatic fluid collection. SPLEEN: Normal. ADRENALS/URINARY TRACT: The adrenal glands are  normal. No hydronephrosis, nephroureterolithiasis or solid renal mass. The urinary bladder is normal for degree of distention STOMACH/BOWEL: There is no hiatal hernia. Normal duodenal course and caliber. No small bowel dilatation or inflammation. There is fistulous connection between the rectum and the dorsal skin surface. There is stool extending through this connection. Normal appendix. VASCULAR/LYMPHATIC: Normal course and caliber of the major abdominal vessels. No abdominal or pelvic lymphadenopathy. REPRODUCTIVE: Normal uterus. No adnexal mass. MUSCULOSKELETAL. There is a right ischial decubitus ulcer with osteolysis of the ischial tuberosity. The left ischial tuberosity is also likely exposed the external environment. OTHER: None. IMPRESSION: 1. Bilateral ischial decubitus ulcers with associated underlying osteomyelitis. 2. 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 3. Bilateral basilar endobronchial opacities, right worse than left, concerning for aspiration pneumonia. Electronically Signed   By: Ulyses Jarred M.D.   On: 08/05/2020 02:39   DG Chest Port 1 View  Result Date: 08/04/2020 CLINICAL DATA:  Code sepsis altered low blood pressure EXAM: PORTABLE CHEST 1 VIEW COMPARISON:  05/31/2020, 05/13/2020 FINDINGS: Left lung is grossly clear. Patchy vague airspace opacities in the right mid lung and lung base. No pleural effusion. Normal heart size. No pneumothorax. IMPRESSION: Patchy vague airspace opacities in the right mid lung and lung base, suspicious for pneumonia. Electronically Signed   By: Donavan Foil M.D.   On: 08/04/2020 21:41   DG Chest Port 1V same Day  Result Date: 08/04/2020 CLINICAL DATA:  Central line placement. History of multiple sclerosis. EXAM: PORTABLE CHEST 1 VIEW COMPARISON:  08/04/2020 FINDINGS: Heart size and pulmonary vascularity are normal. Right central venous catheter with tip over the cavoatrial junction.  Peribronchial thickening with patchy perihilar opacities on the right. This may represent bronchitis or pneumonia. No significant change since previous study, allowing for differences in technique. No pleural effusions. No pneumothorax. Mediastinal contours are normal. Degenerative changes in the spine and shoulders. Surgical clips in the right upper quadrant. IMPRESSION: 1. Right central venous catheter with tip over the cavoatrial junction. No pneumothorax. 2. Peribronchial thickening with patchy perihilar opacities on the right suggesting bronchitis or pneumonia. Electronically Signed  By: Lucienne Capers M.D.   On: 08/04/2020 22:40    Scheduled Meds: . albuterol  2.5 mg Nebulization Q6H  . Chlorhexidine Gluconate Cloth  6 each Topical Daily  . collagenase   Topical Daily  . docusate sodium  100 mg Oral BID  . feeding supplement (ENSURE ENLIVE)  237 mL Oral BID BM  . pantoprazole (PROTONIX) IV  40 mg Intravenous Q24H  . sodium chloride flush  3 mL Intravenous Q12H   Continuous Infusions: . sodium chloride       LOS: 1 day    Time spent: 35 minutes    Barton Dubois, MD [facility]   To contact the attending provider between 7A-7P or the covering provider during after hours 7P-7A, please log into the web site [email or web address] and access using universal [place] password for that web site. If you do not have the password, please call the hospital operator.  08/06/2020, 2:45 PM

## 2020-08-06 NOTE — TOC Progression Note (Signed)
Transition of Care [facility]) - Progression Note    Patient Details  Name: Tracy Armstrong MRN: [id number] Date of Birth: Oct 14, 1946  [facility]) CM/SW Contact  Barry Brunner, LCSW Phone Number: 08/06/2020, 5:12 PM  Clinical Narrative:    CSW received hospice referral. CSW contacted family to inquire about preferred hospice. Family stated that they preferred [facility]. Admission Coordinators Judeth Cornfield and Elonda Husky not availability. CSW placed referral with [facility] [place] [facility]. Hospice house reported that require a 6 week or less notification from MD, HP, Covid test, and Prognosis. CSW faxed information except Covid test to provided fax number. CSW will fax Covid test results once patient is able to transfer to hospice. Hospice house reported that they currently do not have bed availability and have three other patient's on the waiting list. TOC to follow.        Expected Discharge Plan and Services                                                 Social Determinants of Health (SDOH) Interventions    Readmission Risk Interventions No flowsheet data found.

## 2020-08-07 DIAGNOSIS — Z66 Do not resuscitate: Secondary | ICD-10-CM | POA: Diagnosis not present

## 2020-08-07 DIAGNOSIS — Z789 Other specified health status: Secondary | ICD-10-CM | POA: Diagnosis not present

## 2020-08-07 DIAGNOSIS — Z515 Encounter for palliative care: Secondary | ICD-10-CM

## 2020-08-07 DIAGNOSIS — N179 Acute kidney failure, unspecified: Secondary | ICD-10-CM | POA: Diagnosis not present

## 2020-08-07 DIAGNOSIS — A419 Sepsis, unspecified organism: Secondary | ICD-10-CM | POA: Diagnosis not present

## 2020-08-07 LAB — CULTURE, BLOOD (ROUTINE X 2): Special Requests: ADEQUATE

## 2020-08-07 NOTE — Progress Notes (Signed)
PROGRESS NOTE    Tracy Armstrong  MRN:[id number] DOB: 10/26/1946 DOA: 08/04/2020 PCP: Hasanaj, Xaje A, MD    Chief Complaint  Patient presents with  . Altered Mental Status    Brief Narrative:  As per H&P written by Dr. Zierle Ghosh on 08/05/20 Tracy Armstrong  is a 74 y.o. female, with history of multiple sclerosis and bilateral AKA's presents to the ED with her daughters for decreased responsiveness, and trouble breathing.  Daughter reports that patient has not been verbal, eating adequately, responding to them in 6 weeks.  She thinks the patient had a decrease in alertness 2 weeks ago, and she started having trouble breathing yesterday.  They have not noticed a cough.  They do not think the patient has had a fever at home.  She has not indicated any pain in any way, but she does not communicate.  They report that she has been drinking ensures, reluctantly.  No further history can be obtained . In the ED Temperature was 100.9, heart rate is 129, respiratory rate 26, oxygen sats are likely not accurate given low measurements at 33%, but a PO2 of 121 on ABG White blood cell count 8.6, hemoglobin 8.6 CHEM panel -sodium 151, potassium 4.9, BUN 46, creatinine 1.57, elevated alk phos at 134, elevated anion gap at 19, albumin is 1.7 Blood culture pending Covid and flu negative Chest x-ray shows right-sided pneumonia CT abdomen pelvis pending Broad spectrum antibiotics and fluid bolus in ED per sepsis protocol   Assessment & Plan: 1-severe sepsis with septic shock: In the setting of aspiration pneumonia, Streptococcus agalactiae bacteremia and bilateral ischial osteomyelitis -POA -Despite fluid resuscitation, pressor support and broad-spectrum antibiotics patient continue to be critical and her pressure failed to response without the assistance on pressors -After long discussion with family members they understood that any intervention despite how invasive it is will end up being futile to  patient's condition and long-term prognosis. -Decision was made to transition to full comfort and symptomatic management only -Antibiotics and pressors discontinued -End-of-life care protocol initiated -Anticipate hospital death; but overall life expectancy is less than 6 weeks.. -Family in agreement and aware that if the patient condition plateau they would like to pursuit residential hospice placement.  2-metabolic encephalopathy/lactic acidosis -In the setting of poor organ perfusion with ongoing septic shock and infection -Plan is for full comfort care. -Patient has remained obtunded despite maximal effort and treatment provided.  3-no unstageable decubitus/sacral pressure ulcers: POA -With CT scan demonstrating osteomyelitis in bilateral ischium -Will continue constant repositioning for comfort purposes -End-of-life care protocol. -Case discussed with surgical surgery who fell no point of debridement at this time; as intervention will be painful unable to be overall futile to patient current condition and prognosis..  4-acute kidney injury -In the setting of poor perfusion and hypotension from septic shock -Patient received aggressive fluid resuscitation -At this time no further blood work will be drawn -Will focus on comfort care.  5-hypernatremia -In the setting of poor oral intake and dehydration -Fluid resuscitation was given -At this time moving to comfort care and symptomatic management only -No further blood work will be drawn.  6-severe protein calorie malnutrition -Comfort care -Patient albumin 1.7  7-elevated troponin and presentation -Most likely demand ischemia -No acute ischemic changes appreciated on EKG or telemetry. -We will focus on comfort care -MedSurg bed requested.  8-toxic encephalopathy -Patient has remained obtunded -Will focus on comfort care as per goals of care discussion with patient.  9-DNR/DNI -Long goals   of care discussion with family  members at bedside -End-of-life protocol initiated -Extremely poor prognosis -Will focus on comfort care only.  DVT prophylaxis: None (comfort care only). Code Status: DNR/DNI Family Communication: Daughters is at bedside Disposition:   Status is: Inpatient  Dispo: The patient is from: Home              Anticipated d/c is to: Hospice home if patient plateau and not die in the hospital              Anticipated d/c date is: to be determined (but hospital death anticipated)              Patient currently is not medically stable for discharge; extremely poor prognosis and anticipated hospital death; after long discussion with family members at bedside decision has been made to pursuit comfort and symptomatic management only; pressors support will be discontinue, antibiotics will be stopped, patient DNR/DNI and with transition to end-of-life care protocol.  If stable can be transfer to hospice home if bed available.       Consultants:   Palliative care  Hospice  General surgery   Procedures:  See below for x-ray reports.   Antimicrobials:  Cefepime, vancomycin and Flagyl: At this moment all antibiotics will be discontinued and patient will be transition to full comfort.   Subjective: Unresponsive, not following commands, no eating, not drinking.  Overall appears to be comfortable.     Objective: Vitals:   08/07/20 0100 08/07/20 0300 08/07/20 0500 08/07/20 0801  BP:      Pulse: 92 90    Resp: _0 Temp:    (!) 97.5 F (36.4 C)  TempSrc:    Axillary  SpO2: 100% 100%    Weight:      Height:        Intake/Output Summary (Last 24 hours) at 08/07/2020 1554 Last data filed at 08/06/2020 1800 Gross per 24 hour  Intake 431.48 ml  Output --  Net 431.48 ml   Filed Weights   08/05/20 1720 08/06/20 0400  Weight: 40.8 kg 43.6 kg    Examination: General exam: Afebrile currently; essentially unresponsive and unable to follow any commands.  Patient no eating or  drinking at this time.  Appears to be comfortable. Respiratory system: Positive scattered rhonchi; no using accessory muscles.  Good O2 sat on room air. Cardiovascular system: Rate controlled, no rubs, no gallops, no JVD. Gastrointestinal system: Abdomen is nondistended, soft and nontender. No organomegaly or masses felt. Normal bowel sounds heard. Central nervous system: Unable to properly assess as patient is currently unresponsive and not following commands. Extremities: Bilateral AKA Skin: Unchanged multiple unstageable pressure injury wounds affecting her sacral/ischial area.  All wounds were present on admission. Psychiatry: Unable to properly assess as patient currently unresponsive.   Data Reviewed: I have personally reviewed following labs and imaging studies  CBC: Recent Labs  Lab 08/04/20 2210 08/05/20 0335  WBC 8.6 15.7*  NEUTROABS 7.6 14.2*  HGB 8.6* 7.3*  HCT 30.2* 24.9*  MCV 101.0* 98.8  PLT 82* 73*    Basic Metabolic Panel: Recent Labs  Lab 08/04/20 2210 08/05/20 0335  NA 151* 146*  K 4.9 4.0  CL 113* 113*  CO2 19* 19*  GLUCOSE 109* 127*  BUN 46* 40*  CREATININE 1.57* 1.25*  CALCIUM 8.9 8.0*  MG  --  1.6*  PHOS  --  2.4*    GFR: Estimated Creatinine Clearance: 7.4 mL/min (A) (by C-G formula based  on SCr of 1.25 mg/dL (H)).  Liver Function Tests: Recent Labs  Lab 08/04/20 2210 08/05/20 0335  AST 49* 187*  ALT <5 48*  ALKPHOS 134* 104  BILITOT 0.6 0.7  PROT 6.2* 4.7*  ALBUMIN 1.7* 1.3*    CBG: No results for input(s): GLUCAP in the last 168 hours.   Recent Results (from the past 240 hour(s))  Blood Culture (routine x 2)     Status: Abnormal   Collection Time: 08/04/20 10:10 PM   Specimen: BLOOD  Result Value Ref Range Status   Specimen Description   Final    BLOOD CENTRAL LINE Performed at [facility], [address] [place], [place] [address]    Special Requests   Final    BOTTLES DRAWN AEROBIC AND ANAEROBIC Blood Culture  adequate volume Performed at [facility], [address] [place], [place] [address]    Culture  Setup Time   Final    GRAM POSITIVE COCCI IN CHAINS IN BOTH AEROBIC AND ANAEROBIC BOTTLES Gram Stain Report Called to,Read Back By and Verified With: FOLEY,B AT 6203 ON 08/05/20 BY HUFFINES,S. CRITICAL RESULT CALLED TO, READ BACK BY AND VERIFIED WITH: B HARRIS RN 2022 08/05/20 A BROWNING GRAM POSITIVE COCCI AEROBIC BOTTLE ONLY RESULT PREV CALLED    Culture (A)  Final    GROUP B STREP(S.AGALACTIAE)ISOLATED STREPTOCOCCUS ANGINOSIS THE SIGNIFICANCE OF ISOLATING THIS ORGANISM FROM A SINGLE SET OF BLOOD CULTURES WHEN MULTIPLE SETS ARE DRAWN IS UNCERTAIN. PLEASE NOTIFY THE MICROBIOLOGY DEPARTMENT WITHIN ONE WEEK IF SPECIATION AND SENSITIVITIES ARE REQUIRED. Performed at [facility], [place] [address] [place], [place] [address]    Report Status 08/07/2020 FINAL  Final   Organism ID, Bacteria GROUP B STREP(S.AGALACTIAE)ISOLATED  Final      Susceptibility   Group b strep(s.agalactiae)isolated - MIC*    CLINDAMYCIN >=1 RESISTANT Resistant     AMPICILLIN <=0.25 SENSITIVE Sensitive     ERYTHROMYCIN >=8 RESISTANT Resistant     VANCOMYCIN 0.5 SENSITIVE Sensitive     CEFTRIAXONE <=0.12 SENSITIVE Sensitive     LEVOFLOXACIN 1 SENSITIVE Sensitive     PENICILLIN Value in next row Sensitive      SENSITIVE<=0.06    * GROUP B STREP(S.AGALACTIAE)ISOLATED  Blood Culture ID Panel (Reflexed)     Status: Abnormal   Collection Time: 08/04/20 10:10 PM  Result Value Ref Range Status   Enterococcus faecalis NOT DETECTED NOT DETECTED Final   Enterococcus Faecium NOT DETECTED NOT DETECTED Final   Listeria monocytogenes NOT DETECTED NOT DETECTED Final   Staphylococcus species NOT DETECTED NOT DETECTED Final   Staphylococcus aureus (BCID) NOT DETECTED NOT DETECTED Final   Staphylococcus epidermidis NOT DETECTED NOT DETECTED Final   Staphylococcus lugdunensis NOT DETECTED NOT DETECTED Final   Streptococcus species  DETECTED (A) NOT DETECTED Final    Comment: CRITICAL RESULT CALLED TO, READ BACK BY AND VERIFIED WITH: B HARRIS RN 2022 08/05/20 A BROWNING    Streptococcus agalactiae DETECTED (A) NOT DETECTED Final    Comment: CRITICAL RESULT CALLED TO, READ BACK BY AND VERIFIED WITH: B HARRIS RN 2022 08/05/20 A BROWNING    Streptococcus pneumoniae NOT DETECTED NOT DETECTED Final   Streptococcus pyogenes NOT DETECTED NOT DETECTED Final   A.calcoaceticus-baumannii NOT DETECTED NOT DETECTED Final   Bacteroides fragilis NOT DETECTED NOT DETECTED Final   Enterobacterales NOT DETECTED NOT DETECTED Final   Enterobacter cloacae complex NOT DETECTED NOT DETECTED Final   Escherichia coli NOT DETECTED NOT DETECTED Final   Klebsiella aerogenes NOT  DETECTED NOT DETECTED Final   Klebsiella oxytoca NOT DETECTED NOT DETECTED Final   Klebsiella pneumoniae NOT DETECTED NOT DETECTED Final   Proteus species NOT DETECTED NOT DETECTED Final   Salmonella species NOT DETECTED NOT DETECTED Final   Serratia marcescens NOT DETECTED NOT DETECTED Final   Haemophilus influenzae NOT DETECTED NOT DETECTED Final   Neisseria meningitidis NOT DETECTED NOT DETECTED Final   Pseudomonas aeruginosa NOT DETECTED NOT DETECTED Final   Stenotrophomonas maltophilia NOT DETECTED NOT DETECTED Final   Candida albicans NOT DETECTED NOT DETECTED Final   Candida auris NOT DETECTED NOT DETECTED Final   Candida glabrata NOT DETECTED NOT DETECTED Final   Candida krusei NOT DETECTED NOT DETECTED Final   Candida parapsilosis NOT DETECTED NOT DETECTED Final   Candida tropicalis NOT DETECTED NOT DETECTED Final   Cryptococcus neoformans/gattii NOT DETECTED NOT DETECTED Final    Comment: Performed at [facility], [address] [place], [place] [address]  Respiratory Panel by RT PCR (Flu A&B, Covid) -     Status: None   Collection Time: 08/05/20 12:50 AM  Result Value Ref Range Status   SARS Coronavirus 2 by RT PCR NEGATIVE NEGATIVE Final     Comment: (NOTE) SARS-CoV-2 target nucleic acids are NOT DETECTED.  The SARS-CoV-2 RNA is generally detectable in upper respiratoy specimens during the acute phase of infection. The lowest concentration of SARS-CoV-2 viral copies this assay can detect is 131 copies/mL. A negative result does not preclude SARS-Cov-2 infection and should not be used as the sole basis for treatment or other patient management decisions. A negative result may occur with  improper specimen collection/handling, submission of specimen other than nasopharyngeal swab, presence of viral mutation(s) within the areas targeted by this assay, and inadequate number of viral copies (<131 copies/mL). A negative result must be combined with clinical observations, patient history, and epidemiological information. The expected result is Negative.  Fact Sheet for Patients:  https://www.fda.gov/media/142436/download  Fact Sheet for Healthcare Providers:  https://www.fda.gov/media/142435/download  This test is no t yet approved or cleared by the [place] FDA and  has been authorized for detection and/or diagnosis of SARS-CoV-2 by FDA under an Emergency Use Authorization (EUA). This EUA will remain  in effect (meaning this test can be used) for the duration of the COVID-19 declaration under Section 564(b)(1) of the Act, 21 U.S.C. section 360bbb-3(b)(1), unless the authorization is terminated or revoked sooner.     Influenza A by PCR NEGATIVE NEGATIVE Final   Influenza B by PCR NEGATIVE NEGATIVE Final    Comment: (NOTE) The Xpert Xpress SARS-CoV-2/FLU/RSV assay is intended as an aid in  the diagnosis of influenza from Nasopharyngeal swab specimens and  should not be used as a sole basis for treatment. Nasal washings and  aspirates are unacceptable for Xpert Xpress SARS-CoV-2/FLU/RSV  testing.  Fact Sheet for Patients: https://www.fda.gov/media/142436/download  Fact Sheet for Healthcare  Providers: https://www.fda.gov/media/142435/download  This test is not yet approved or cleared by the [place] FDA and  has been authorized for detection and/or diagnosis of SARS-CoV-2 by  FDA under an Emergency Use Authorization (EUA). This EUA will remain  in effect (meaning this test can be used) for the duration of the  Covid-19 declaration under Section 564(b)(1) of the Act, 21  U.S.C. section 360bbb-3(b)(1), unless the authorization is  terminated or revoked. Performed at [facility], [address] [place], [place] [address]   Blood Culture (routine x 2)     Status: None (Preliminary result)     Collection Time: 08/05/20  3:35 AM   Specimen: BLOOD  Result Value Ref Range Status   Specimen Description BLOOD PICC LINE  Final   Special Requests   Final    BOTTLES DRAWN AEROBIC AND ANAEROBIC Blood Culture adequate volume   Culture   Final    NO GROWTH 2 DAYS Performed at [facility], [address], [place], [place] [address]    Report Status PENDING  Incomplete  Urine culture     Status: Abnormal   Collection Time: 08/05/20 11:37 AM   Specimen: Urine, Catheterized  Result Value Ref Range Status   Specimen Description   Final    URINE, CATHETERIZED Performed at [facility], [address] [place], [place] [address]    Special Requests   Final    NONE Performed at [facility], [address], [place], [place] [address]    Culture 30,000 COLONIES/mL YEAST (A)  Final   Report Status 08/06/2020 FINAL  Final  MRSA PCR Screening     Status: None   Collection Time: 08/05/20  3:30 PM   Specimen: Nasopharyngeal  Result Value Ref Range Status   MRSA by PCR NEGATIVE NEGATIVE Final    Comment:        The GeneXpert MRSA Assay (FDA approved for NASAL specimens only), is one component of a comprehensive MRSA colonization surveillance program. It is not intended to diagnose MRSA infection nor to guide or monitor treatment for MRSA infections. Performed at [facility], [address] [place], [place] [address]      Radiology Studies: No results found.  Scheduled Meds: . Chlorhexidine Gluconate Cloth  6 each Topical Daily  . collagenase   Topical Daily  . docusate sodium  100 mg Oral BID  . feeding supplement (ENSURE ENLIVE)  237 mL Oral BID BM  . pantoprazole (PROTONIX) IV  40 mg Intravenous Q24H  . sodium chloride flush  3 mL Intravenous Q12H   Continuous Infusions: . sodium chloride       LOS: 2 days    Time spent: 30 minutes    Barton Dubois, MD [facility]   To contact the attending provider between 7A-7P or the covering provider during after hours 7P-7A, please log into the web site [email or web address] and access using universal [place] password for that web site. If you do not have the password, please call the hospital operator.  08/07/2020, 3:54 PM

## 2020-08-08 ENCOUNTER — Inpatient Hospital Stay (HOSPITAL_COMMUNITY)
Admission: RE | Admit: 2020-08-08 | Discharge: 2020-09-05 | DRG: 951 | Disposition: E | Source: Hospice | Attending: Internal Medicine | Admitting: Internal Medicine

## 2020-08-08 DIAGNOSIS — L89150 Pressure ulcer of sacral region, unstageable: Secondary | ICD-10-CM | POA: Diagnosis present

## 2020-08-08 DIAGNOSIS — M869 Osteomyelitis, unspecified: Secondary | ICD-10-CM | POA: Diagnosis present

## 2020-08-08 DIAGNOSIS — J690 Pneumonitis due to inhalation of food and vomit: Secondary | ICD-10-CM | POA: Diagnosis present

## 2020-08-08 DIAGNOSIS — E872 Acidosis: Secondary | ICD-10-CM | POA: Diagnosis present

## 2020-08-08 DIAGNOSIS — N179 Acute kidney failure, unspecified: Secondary | ICD-10-CM | POA: Diagnosis present

## 2020-08-08 DIAGNOSIS — R630 Anorexia: Secondary | ICD-10-CM | POA: Diagnosis not present

## 2020-08-08 DIAGNOSIS — G928 Other toxic encephalopathy: Secondary | ICD-10-CM | POA: Diagnosis present

## 2020-08-08 DIAGNOSIS — Z833 Family history of diabetes mellitus: Secondary | ICD-10-CM

## 2020-08-08 DIAGNOSIS — R6521 Severe sepsis with septic shock: Secondary | ICD-10-CM | POA: Diagnosis present

## 2020-08-08 DIAGNOSIS — A401 Sepsis due to streptococcus, group B: Secondary | ICD-10-CM | POA: Diagnosis present

## 2020-08-08 DIAGNOSIS — A419 Sepsis, unspecified organism: Secondary | ICD-10-CM | POA: Diagnosis not present

## 2020-08-08 DIAGNOSIS — Z789 Other specified health status: Secondary | ICD-10-CM | POA: Diagnosis not present

## 2020-08-08 DIAGNOSIS — Z515 Encounter for palliative care: Principal | ICD-10-CM

## 2020-08-08 DIAGNOSIS — Z66 Do not resuscitate: Secondary | ICD-10-CM | POA: Diagnosis present

## 2020-08-08 DIAGNOSIS — Z8249 Family history of ischemic heart disease and other diseases of the circulatory system: Secondary | ICD-10-CM

## 2020-08-08 DIAGNOSIS — E870 Hyperosmolality and hypernatremia: Secondary | ICD-10-CM | POA: Diagnosis present

## 2020-08-08 DIAGNOSIS — R531 Weakness: Secondary | ICD-10-CM | POA: Diagnosis not present

## 2020-08-08 DIAGNOSIS — Z89611 Acquired absence of right leg above knee: Secondary | ICD-10-CM

## 2020-08-08 DIAGNOSIS — G35 Multiple sclerosis: Secondary | ICD-10-CM | POA: Diagnosis present

## 2020-08-08 DIAGNOSIS — J189 Pneumonia, unspecified organism: Secondary | ICD-10-CM | POA: Diagnosis not present

## 2020-08-08 DIAGNOSIS — L8995 Pressure ulcer of unspecified site, unstageable: Secondary | ICD-10-CM | POA: Diagnosis not present

## 2020-08-08 DIAGNOSIS — E860 Dehydration: Secondary | ICD-10-CM | POA: Diagnosis present

## 2020-08-08 DIAGNOSIS — E43 Unspecified severe protein-calorie malnutrition: Secondary | ICD-10-CM | POA: Diagnosis present

## 2020-08-08 DIAGNOSIS — G9341 Metabolic encephalopathy: Secondary | ICD-10-CM | POA: Diagnosis present

## 2020-08-08 DIAGNOSIS — R54 Age-related physical debility: Secondary | ICD-10-CM | POA: Diagnosis present

## 2020-08-08 DIAGNOSIS — R778 Other specified abnormalities of plasma proteins: Secondary | ICD-10-CM

## 2020-08-08 DIAGNOSIS — Z89612 Acquired absence of left leg above knee: Secondary | ICD-10-CM | POA: Diagnosis not present

## 2020-08-08 DIAGNOSIS — J9601 Acute respiratory failure with hypoxia: Secondary | ICD-10-CM | POA: Diagnosis not present

## 2020-08-08 MED ORDER — LORAZEPAM 2 MG/ML PO CONC: 1.0000 mg | ORAL | Status: DC | PRN

## 2020-08-08 MED ORDER — ONDANSETRON HCL 4 MG/2ML IJ SOLN: 4.0000 mg | Freq: Four times a day (QID) | INTRAMUSCULAR | Status: DC | PRN

## 2020-08-08 MED ORDER — COLLAGENASE 250 UNIT/GM EX OINT: TOPICAL_OINTMENT | Freq: Every day | CUTANEOUS | Status: AC

## 2020-08-08 MED ORDER — GLYCOPYRROLATE 1 MG PO TABS: 1.0000 mg | ORAL_TABLET | ORAL | Status: DC | PRN

## 2020-08-08 MED ORDER — ACETAMINOPHEN 650 MG RE SUPP: 650.0000 mg | Freq: Four times a day (QID) | RECTAL | Status: DC | PRN

## 2020-08-08 MED ORDER — POLYVINYL ALCOHOL 1.4 % OP SOLN: 1.0000 [drp] | Freq: Four times a day (QID) | OPHTHALMIC | 0 refills | Status: AC | PRN

## 2020-08-08 MED ORDER — LORAZEPAM 2 MG/ML IJ SOLN: 1.0000 mg | INTRAMUSCULAR | Status: DC | PRN

## 2020-08-08 MED ORDER — ENSURE ENLIVE PO LIQD
237.0000 mL | Freq: Two times a day (BID) | ORAL | Status: DC
  Administered 2020-08-09 (×2): 237 mL via ORAL

## 2020-08-08 MED ORDER — HALOPERIDOL LACTATE 5 MG/ML IJ SOLN: 0.5000 mg | INTRAMUSCULAR | Status: DC | PRN

## 2020-08-08 MED ORDER — ONDANSETRON HCL 4 MG PO TABS: 4.0000 mg | ORAL_TABLET | Freq: Four times a day (QID) | ORAL | Status: DC | PRN

## 2020-08-08 MED ORDER — COLLAGENASE 250 UNIT/GM EX OINT
TOPICAL_OINTMENT | Freq: Every day | CUTANEOUS | Status: DC
  Filled 2020-08-08: qty 30

## 2020-08-08 MED ORDER — ACETAMINOPHEN 325 MG PO TABS: 650.0000 mg | ORAL_TABLET | Freq: Four times a day (QID) | ORAL | Status: DC | PRN

## 2020-08-08 MED ORDER — HALOPERIDOL 0.5 MG PO TABS: 0.5000 mg | ORAL_TABLET | ORAL | Status: DC | PRN

## 2020-08-08 MED ORDER — LORAZEPAM 1 MG PO TABS: 1.0000 mg | ORAL_TABLET | ORAL | 0 refills | Status: AC | PRN

## 2020-08-08 MED ORDER — GLYCOPYRROLATE 0.2 MG/ML IJ SOLN: 0.2000 mg | INTRAMUSCULAR | Status: DC | PRN

## 2020-08-08 MED ORDER — SODIUM CHLORIDE 0.9% FLUSH
3.0000 mL | Freq: Two times a day (BID) | INTRAVENOUS | Status: DC
  Administered 2020-08-09 – 2020-08-10 (×4): 3 mL via INTRAVENOUS

## 2020-08-08 MED ORDER — DOCUSATE SODIUM 100 MG PO CAPS: 100.0000 mg | ORAL_CAPSULE | Freq: Two times a day (BID) | ORAL | Status: DC

## 2020-08-08 MED ORDER — MORPHINE SULFATE (PF) 2 MG/ML IV SOLN
2.0000 mg | INTRAVENOUS | Status: AC | PRN
Start: 2020-08-08 — End: ?

## 2020-08-08 MED ORDER — POLYVINYL ALCOHOL 1.4 % OP SOLN: 1.0000 [drp] | Freq: Four times a day (QID) | OPHTHALMIC | Status: DC | PRN

## 2020-08-08 MED ORDER — HALOPERIDOL LACTATE 2 MG/ML PO CONC: 0.5000 mg | ORAL | Status: AC | PRN

## 2020-08-08 MED ORDER — LORAZEPAM 1 MG PO TABS: 1.0000 mg | ORAL_TABLET | ORAL | Status: DC | PRN

## 2020-08-08 MED ORDER — PANTOPRAZOLE SODIUM 40 MG IV SOLR: 40.0000 mg | INTRAVENOUS | Status: AC

## 2020-08-08 MED ORDER — HALOPERIDOL LACTATE 2 MG/ML PO CONC: 0.5000 mg | ORAL | Status: DC | PRN

## 2020-08-08 MED ORDER — IOHEXOL 300 MG/ML  SOLN: 100.0000 mL | Freq: Once | INTRAMUSCULAR | Status: DC | PRN

## 2020-08-08 MED ORDER — SODIUM CHLORIDE 0.9% FLUSH: 3.0000 mL | INTRAVENOUS | Status: DC | PRN

## 2020-08-08 MED ORDER — CHLORHEXIDINE GLUCONATE CLOTH 2 % EX PADS
6.0000 | MEDICATED_PAD | Freq: Every day | CUTANEOUS | Status: DC
  Administered 2020-08-10: 6 via TOPICAL

## 2020-08-08 MED ORDER — CHLORHEXIDINE GLUCONATE CLOTH 2 % EX PADS: 6.0000 | MEDICATED_PAD | Freq: Every day | CUTANEOUS | Status: AC

## 2020-08-08 MED ORDER — ONDANSETRON 4 MG PO TBDP: 4.0000 mg | ORAL_TABLET | Freq: Four times a day (QID) | ORAL | Status: DC | PRN

## 2020-08-08 MED ORDER — BIOTENE DRY MOUTH MT LIQD: 15.0000 mL | OROMUCOSAL | Status: DC | PRN

## 2020-08-08 MED ORDER — BIOTENE DRY MOUTH MT LIQD: 15.0000 mL | OROMUCOSAL | Status: AC | PRN

## 2020-08-08 MED ORDER — SODIUM CHLORIDE 0.9 % IV SOLN: 250.0000 mL | INTRAVENOUS | Status: DC | PRN

## 2020-08-08 MED ORDER — MORPHINE SULFATE (PF) 2 MG/ML IV SOLN: 2.0000 mg | INTRAVENOUS | Status: DC | PRN

## 2020-08-08 MED ORDER — ALBUTEROL SULFATE (2.5 MG/3ML) 0.083% IN NEBU: 2.5000 mg | INHALATION_SOLUTION | RESPIRATORY_TRACT | Status: DC | PRN

## 2020-08-08 MED ORDER — GLYCOPYRROLATE 0.2 MG/ML IJ SOLN: 0.2000 mg | INTRAMUSCULAR | Status: AC | PRN

## 2020-08-08 MED ORDER — ACETAMINOPHEN 325 MG PO TABS: 650.0000 mg | ORAL_TABLET | Freq: Four times a day (QID) | ORAL | Status: AC | PRN

## 2020-08-08 MED ORDER — MORPHINE SULFATE (PF) 2 MG/ML IV SOLN: 1.0000 mg | INTRAVENOUS | Status: DC | PRN

## 2020-08-08 MED ORDER — PANTOPRAZOLE SODIUM 40 MG IV SOLR
40.0000 mg | INTRAVENOUS | Status: DC
  Administered 2020-08-09 – 2020-08-10 (×2): 40 mg via INTRAVENOUS
  Filled 2020-08-08 (×2): qty 40

## 2020-08-08 MED ORDER — ONDANSETRON HCL 4 MG/2ML IJ SOLN: 4.0000 mg | Freq: Four times a day (QID) | INTRAMUSCULAR | Status: AC | PRN

## 2020-08-08 MED ORDER — ALBUTEROL SULFATE (2.5 MG/3ML) 0.083% IN NEBU: 2.5000 mg | INHALATION_SOLUTION | RESPIRATORY_TRACT | Status: AC | PRN

## 2020-08-08 NOTE — Progress Notes (Addendum)
Nutrition Brief Note  Chart reviewed.  Pt now transitioning to comfort care.   No further nutrition interventions warranted at this time.     Royann Shivers MS,RD,CSG,LDN Pager: Loretha Stapler

## 2020-08-08 NOTE — H&P (Signed)
History and Physical    Tracy Armstrong BTD:[id number] DOB: 02-22-1946 DOA: 08/04/2020  PCP: Neale Burly, MD   Patient coming from: Came from home initially; transition to [facility] after admitted to the hospital.  Initially no beds at [facility] and subsequently too frail/fragile for transportation.  I have personally briefly reviewed patient's old medical records in [place]  Chief Complaint: End-of-life care.  HPI:  As per H&P written by Dr. Clearence Ped on 08/05/20 Tracy Armstrong a76 y.o.female,with history of multiple sclerosis and bilateral AKA's presents to the ED with her daughters for decreased responsiveness, and trouble breathing. Daughter reports that patient has not been verbal, eating adequately, responding to them in 6 weeks. She thinks the patient had a decrease in alertness 2 weeks ago, and she started having trouble breathing yesterday. They have not noticed a cough. They do not think the patient has had a fever at home. She has not indicated any pain in any way, but she does not communicate. They report that she has been drinking ensures, reluctantly. No further history can be obtained . In the ED Temperature was 100.9, heart rate is 129, respiratory rate 26, oxygen sats are likely not accurate given low measurements at 33%, but a PO2 of 121 on ABG White blood cell count 8.6, hemoglobin 8.6 CHEM panel-sodium 151, potassium 4.9, BUN 46, creatinine 1.57, elevated alk phos at 134, elevated anion gap at 19, albumin is 1.7 Blood culture pending Covid and flu negative Chest x-ray shows right-sided pneumonia CT abdomen pelvis pending Broad spectrum antibiotics and fluid bolus in ED per sepsis protocol  Despite aggressive measurements patient continue to active decline and due to poor quality of life at baseline, goals of care discussions has led to transition to full comfort care and symptomatic management only.  Patient transition to GIP as she is too  fragile and frail to be transported to hospice home.  Will continue symptom controls, end-of-life and full comfort care hospital death anticipated.  Review of Systems: As per HPI otherwise all other systems reviewed and are negative.   Past Medical History:  Diagnosis Date  . Multiple sclerosis ([place]) 2009    Past Surgical History:  Procedure Laterality Date  . AMPUTATION Bilateral 06/03/2020   Procedure: BILATERAL ABOVE KNEE AMPUTATIONS;  Surgeon: Newt Minion, MD;  Location: [facility];  Service: Orthopedics;  Laterality: Bilateral;  . CHOLECYSTECTOMY      Social History  reports that she has never smoked. She has never used smokeless tobacco. She reports that she does not drink alcohol and does not use drugs.  No Known Allergies  Family history Unable to be reviewed secondary to patient current mental status -Ventilator bedside there is history of hypertension and diabetes in her family.  Prior to Admission medications   Medication Sig Start Date End Date Taking? Authorizing Provider  feeding supplement, ENSURE ENLIVE, (ENSURE ENLIVE) LIQD Take 237 mLs by mouth 2 (two) times daily between meals. Patient taking differently: Take 237 mLs by mouth 3 (three) times daily between meals.  06/14/20  Yes Mariel Aloe, MD  acetaminophen (TYLENOL) 325 MG tablet Take 2 tablets (650 mg total) by mouth every 6 (six) hours as needed for mild pain (or Fever >/= 101). 08/26/2020   Barton Dubois, MD  albuterol (PROVENTIL) (2.5 MG/3ML) 0.083% nebulizer solution Take 3 mLs (2.5 mg total) by nebulization every 4 (four) hours as needed for wheezing or shortness of breath. 08/12/2020   Barton Dubois, MD  antiseptic oral  rinse (BIOTENE) LIQD Apply 15 mLs topically as needed for dry mouth. 08/24/2020   Barton Dubois, MD  Chlorhexidine Gluconate Cloth 2 % PADS Apply 6 each topically daily. 08/09/20   Barton Dubois, MD  collagenase (SANTYL) ointment Apply topically daily. 08/09/20   Barton Dubois, MD  docusate  sodium (COLACE) 100 MG capsule Take 1 capsule (100 mg total) by mouth 2 (two) times daily. Patient not taking: Reported on 08/05/2020 06/14/20   Mariel Aloe, MD  glycopyrrolate (ROBINUL) 0.2 MG/ML injection Inject 1 mL (0.2 mg total) into the skin every 4 (four) hours as needed (excessive secretions). 09/03/2020   Barton Dubois, MD  haloperidol (HALDOL) 2 MG/ML solution Place 0.3 mLs (0.6 mg total) under the tongue every 4 (four) hours as needed for agitation (or delirium). 09/03/2020   Barton Dubois, MD  LORazepam (ATIVAN) 1 MG tablet Take 1 tablet (1 mg total) by mouth every 4 (four) hours as needed for anxiety. 08/14/2020   Barton Dubois, MD  morphine 2 MG/ML injection Inject 1 mL (2 mg total) into the vein every 3 (three) hours as needed (signs of pain). 08/18/2020   Barton Dubois, MD  ondansetron [facility]) 4 MG/2ML SOLN injection Inject 2 mLs (4 mg total) into the vein every 6 (six) hours as needed for nausea. 08/28/2020   Barton Dubois, MD  pantoprazole (PROTONIX) 40 MG injection Inject 40 mg into the vein daily. 08/09/20   Barton Dubois, MD  polyvinyl alcohol (LIQUIFILM TEARS) 1.4 % ophthalmic solution Place 1 drop into both eyes 4 (four) times daily as needed for dry eyes. 08/12/2020   Barton Dubois, MD    Physical Exam: Vitals:   08/05/2020 0400 08/29/2020 0500 08/10/2020 0600 08/31/2020 1210  BP:    (!) 59/51  Pulse:      Resp: $Remo'15 14 14 'miDvD$ (!) 22  Temp:      TempSrc:      SpO2:    92%  Weight:      Height:        Vitals:   08/26/2020 0400 08/10/2020 0500 08/09/2020 0600 08/29/2020 1210  BP:    (!) 59/51  Pulse:      Resp: $Remo'15 14 14 'behpH$ (!) 22  Temp:      TempSrc:      SpO2:    92%  Weight:      Height:       General exam:Afebrile, unresponsive; not following commands, no eating, not drinking,very frail, underweight and chronically ill in appearance.Patient with low BP and now decrease O2 saturation. Respiratory system:No crackles, no wheezing, positive rhonchi.2-3 O2 supplementation for comfort  currently. Cardiovascular system:rate controlled, no rubs, no gallops. Gastrointestinal system:Abdomen is nondistended, soft and nontender. No organomegaly or masses felt. Normal bowel sounds heard. Central nervous system:unable to properly assess secondary to ongoing unresponsiveness Extremities:bilateral AKA. Skin:multiple unstageable pressure injury and open wounds affecting sacrum and ischial area. All wounds present on admission. Psychiatry:unresponsive.   Labs on Admission: I have personally reviewed following labs and imaging studies  CBC: Recent Labs  Lab 08/04/20 2210 08/05/20 0335  WBC 8.6 15.7*  NEUTROABS 7.6 14.2*  HGB 8.6* 7.3*  HCT 30.2* 24.9*  MCV 101.0* 98.8  PLT 82* 73*    Basic Metabolic Panel: Recent Labs  Lab 08/04/20 2210 08/05/20 0335  NA 151* 146*  K 4.9 4.0  CL 113* 113*  CO2 19* 19*  GLUCOSE 109* 127*  BUN 46* 40*  CREATININE 1.57* 1.25*  CALCIUM 8.9 8.0*  MG  --  1.6*  PHOS  --  2.4*    GFR: Estimated Creatinine Clearance: 7.4 mL/min (A) (by C-G formula based on SCr of 1.25 mg/dL (H)).  Liver Function Tests: Recent Labs  Lab 08/04/20 2210 08/05/20 0335  AST 49* 187*  ALT <5 48*  ALKPHOS 134* 104  BILITOT 0.6 0.7  PROT 6.2* 4.7*  ALBUMIN 1.7* 1.3*    Urine analysis:    Component Value Date/Time   COLORURINE AMBER (A) 08/05/2020 1137   APPEARANCEUR HAZY (A) 08/05/2020 1137   LABSPEC 1.017 08/05/2020 1137   PHURINE 5.0 08/05/2020 1137   GLUCOSEU NEGATIVE 08/05/2020 1137   HGBUR LARGE (A) 08/05/2020 1137   BILIRUBINUR NEGATIVE 08/05/2020 1137   KETONESUR NEGATIVE 08/05/2020 1137   PROTEINUR 100 (A) 08/05/2020 1137   UROBILINOGEN 0.2 07/19/2011 1232   NITRITE NEGATIVE 08/05/2020 1137   LEUKOCYTESUR SMALL (A) 08/05/2020 1137    Radiological Exams on Admission: No results found.  Assessment/Plan 1-severe sepsis with septic shock: In the setting of aspiration pneumonia, Streptococcus agalactiae bacteremia and  bilateral ischial osteomyelitis -POA -Despite fluid resuscitation, pressor support and broad-spectrum antibiotics patient continue to be critical and her pressure failed to response without the assistance on pressors -After long discussion with family members they understood that any intervention despite how invasive it is will end up being futile to patient's condition and long-term prognosis. -Decision was made to transition to full comfort and symptomatic management only -Antibiotics and pressors discontinued -End-of-life care protocol initiated -Anticipate hospital death; but overall life expectancy is less than 6 weeks.. -Family in agreement and aware that if the patient condition plateau they would like to pursuit residential hospice placement.  2-Toxic and metabolic encephalopathy/lactic acidosis -In the setting of poor organ perfusion with ongoing septic shock and infection -Plan is for full comfort care. -Patient has remained obtunded despite maximal effort and treatment provided.  3-no unstageable decubitus/sacral pressure ulcers: POA -With CT scan demonstrating osteomyelitis in bilateral ischium -Will continue constant repositioning for comfort purposes -End-of-life care protocol. -Case discussed with surgical surgery who fell no point of debridement at this time; as intervention will be painful unable to be overall futile to patient current condition and prognosis.Marland Kitchen  4-acute kidney injury -In the setting of poor perfusion and hypotension from septic shock -Patient received aggressive fluid resuscitation -At this time no further blood work will be drawn -Will focus on comfort care.  5-hypernatremia -In the setting of poor oral intake and dehydration -Fluid resuscitation was given -At this time moving to full comfort care and symptomatic management only -No further blood work will be drawn.  6-severe protein calorie malnutrition -continue Comfort care -Patient  albumin 1.7  7-elevated troponin and presentation -Most likely demand ischemia -No acute ischemic changes appreciated on EKG or telemetry. -Will continue to focus on comfort care -MedSurg bed requested.  8-DNR/DNI -Long goal of care discussion with family members at bedside -End-of-life protocol initiated -Extremely poor prognosis and hospital death anticipated -patient accepted for GIP; to frail to be transfer to hospice. -Will focus on comfort care only.  Mildly interval capital  DVT prophylaxis: None (full comfort only). Code Status:   DNR/DNI Family Communication:  Daughter at bedside. Disposition Plan:   Patient is from:  Home  Anticipated DC to:  Hospital death  Anticipated DC date:  To be determined.  Anticipated DC barriers: No bed availability at residential hospice initially and patient is to fragile for transportation. Consults called:  Hospice Admission status:  Inpatient, length of stay more than  2 midnights; MedSurg bed.  Severity of Illness: Severe severity; admitted with severe sepsis and septic shock.  Despite aggressive measures and pressure support patient failed to improve and after goals of care discussion with family decision made to transition to full comfort care.  At this moment patient has been accepted to hospice will be admitted under GIP for further end-of-life care and symptomatic management.   Barton Dubois MD [facility]  How to contact the [facility] Attending or Consulting provider [place] or covering provider during after hours [place], for this patient?   1. Check the care team in [facility] and look for a) attending/consulting [facility] provider listed and b) the [facility] team listed 2. Log into [email or web address] and use [place]'s universal password to access. If you do not have the password, please contact the hospital operator. 3. Locate the [facility] provider you are looking for under [facility] and page to a number that you can be directly  reached. 4. If you still have difficulty reaching the provider, please page the [facility] (Director on Call) for the Hospitalists listed on amion for assistance.  08/23/2020, 5:57 PM

## 2020-08-08 NOTE — Discharge Summary (Signed)
Physician Discharge Summary  Tracy Armstrong YBO:[id number] DOB: 12-20-1945 DOA: 08/04/2020  PCP: Neale Burly, MD  Admit date: 08/04/2020 Discharge date: 08/05/2020  Time spent: 35 minutes  Recommendations for Outpatient Follow-up:  GIP Comfort care only  Discharge Diagnoses:  Active Problems:   Sepsis ([place])   Pressure injury of skin Toxic/metabolic encephalopathy Lactic acidosis Hyponatremia Acute kidney injury Bilateral AKA Severe protein calorie malnutrition Elevated troponin/demand ischemia DNR/DNI  Discharge Condition: GIP; to frail to be transfer to hospice house. Anticipating hospital death.  Code status: DNR/DNI  Diet recommendation: comfort feeding  Filed Weights   08/05/20 1720 08/06/20 0400  Weight: 40.8 kg 43.6 kg    History of present illness:  As per H&P written by Dr. Clearence Ped on 08/05/20 Tracy Armstrong a19 y.o.female,with history of multiple sclerosis and bilateral AKA's presents to the ED with her daughters for decreased responsiveness, and trouble breathing. Daughter reports that patient has not been verbal, eating adequately, responding to them in 6 weeks. She thinks the patient had a decrease in alertness 2 weeks ago, and she started having trouble breathing yesterday. They have not noticed a cough. They do not think the patient has had a fever at home. She has not indicated any pain in any way, but she does not communicate. They report that she has been drinking ensures, reluctantly. No further history can be obtained . In the ED Temperature was 100.9, heart rate is 129, respiratory rate 26, oxygen sats are likely not accurate given low measurements at 33%, but a PO2 of 121 on ABG White blood cell count 8.6, hemoglobin 8.6 CHEM panel-sodium 151, potassium 4.9, BUN 46, creatinine 1.57, elevated alk phos at 134, elevated anion gap at 19, albumin is 1.7 Blood culture pending Covid and flu negative Chest x-ray shows right-sided  pneumonia CT abdomen pelvis pending Broad spectrum antibiotics and fluid bolus in ED per sepsis protocol   Hospital Course:  1-severe sepsis with septic shock: In the setting of aspiration pneumonia, Streptococcus agalactiae bacteremia and bilateral ischial osteomyelitis -POA -Despite fluid resuscitation, pressor support and broad-spectrum antibiotics patient continue to be critical and her pressure failed to response without the assistance on pressors -After long discussion with family members they understood that any intervention despite how invasive it is will end up being futile to patient's condition and long-term prognosis. -Decision was made to transition to full comfort and symptomatic management only -Antibiotics and pressors discontinued -End-of-life care protocol initiated -Anticipate hospital death; but overall life expectancy is less than 6 weeks.. -Family in agreement and aware that if the patient condition plateau they would like to pursuit residential hospice placement.  2-Toxic and metabolic encephalopathy/lactic acidosis -In the setting of poor organ perfusion with ongoing septic shock and infection -Plan is for full comfort care. -Patient has remained obtunded despite maximal effort and treatment provided.  3-no unstageable decubitus/sacral pressure ulcers: POA -With CT scan demonstrating osteomyelitis in bilateral ischium -Will continue constant repositioning for comfort purposes -End-of-life care protocol. -Case discussed with surgical surgery who fell no point of debridement at this time; as intervention will be painful unable to be overall futile to patient current condition and prognosis.Marland Kitchen  4-acute kidney injury -In the setting of poor perfusion and hypotension from septic shock -Patient received aggressive fluid resuscitation -At this time no further blood work will be drawn -Will focus on comfort care.  5-hypernatremia -In the setting of poor oral  intake and dehydration -Fluid resuscitation was given -At this time moving to full comfort care  and symptomatic management only -No further blood work will be drawn.  6-severe protein calorie malnutrition -continue Comfort care -Patient albumin 1.7  7-elevated troponin and presentation -Most likely demand ischemia -No acute ischemic changes appreciated on EKG or telemetry. -Will continue to focus on comfort care -MedSurg bed requested.  8-DNR/DNI -Long goal of care discussion with family members at bedside -End-of-life protocol initiated -Extremely poor prognosis and hospital death anticipated -patient accepted for GIP; to frail to be transfer to hospice. -Will focus on comfort care only.  Procedures:  See below for x-ray reports.  Consultations:  palliative care  hospice  Discharge Exam: Vitals:   08/14/2020 0600 08/06/2020 1210  BP:  (!) 59/51  Pulse:    Resp: 14 (!) 22  Temp:    SpO2:  92%   General exam: Afebrile, unresponsive; not following commands, no eating, not drinking, very frail, underweight and chronically ill in appearance. Patient with low BP and now decrease O2 saturation. Respiratory system: No crackles, no wheezing, positive rhonchi. 2-3 O2 supplementation for comfort currently. Cardiovascular system: rate controlled, no rubs, no gallops. Gastrointestinal system: Abdomen is nondistended, soft and nontender. No organomegaly or masses felt. Normal bowel sounds heard. Central nervous system: unable to properly assess secondary to ongoing unresponsiveness  Extremities: bilateral AKA. Skin: multiple unstageable pressure injury and open wounds affecting sacrum and ischial area. All wounds present on admission.  Psychiatry: unresponsive.  Discharge Instructions   Discharge Instructions    Diet - low sodium heart healthy   Complete by: As directed    Discharge instructions   Complete by: As directed    Comfort feeding Comfort care End of life care.    Discharge wound care:   Complete by: As directed    Pack left ischium with moist gauze every day and then cover with abdominal pad and tape.  Apply Santyl to middle back to wounds and right ischial, and sacral wound daily then covered with moist gauze and abdominal pad and tape.   Increase activity slowly   Complete by: As directed      Allergies as of 08/25/2020   No Known Allergies     Medication List    STOP taking these medications   bisoprolol-hydrochlorothiazide 2.5-6.25 MG tablet Commonly known as: ZIAC   furosemide 20 MG tablet Commonly known as: LASIX   mirtazapine 7.5 MG tablet Commonly known as: REMERON   ondansetron 4 MG tablet Commonly known as: ZOFRAN Replaced by: ondansetron 4 MG/2ML Soln injection   pantoprazole 40 MG tablet Commonly known as: PROTONIX Replaced by: pantoprazole 40 MG injection     TAKE these medications   acetaminophen 325 MG tablet Commonly known as: TYLENOL Take 2 tablets (650 mg total) by mouth every 6 (six) hours as needed for mild pain (or Fever >/= 101).   albuterol (2.5 MG/3ML) 0.083% nebulizer solution Commonly known as: PROVENTIL Take 3 mLs (2.5 mg total) by nebulization every 4 (four) hours as needed for wheezing or shortness of breath.   antiseptic oral rinse Liqd Apply 15 mLs topically as needed for dry mouth.   Chlorhexidine Gluconate Cloth 2 % Pads Apply 6 each topically daily. Start taking on: August 09, 2020   collagenase ointment Commonly known as: SANTYL Apply topically daily. Start taking on: August 09, 2020   docusate sodium 100 MG capsule Commonly known as: COLACE Take 1 capsule (100 mg total) by mouth 2 (two) times daily.   feeding supplement (ENSURE ENLIVE) Liqd Take 237 mLs by mouth 2 (two)  times daily between meals. What changed: when to take this   glycopyrrolate 0.2 MG/ML injection Commonly known as: ROBINUL Inject 1 mL (0.2 mg total) into the skin every 4 (four) hours as needed (excessive  secretions).   haloperidol 2 MG/ML solution Commonly known as: HALDOL Place 0.3 mLs (0.6 mg total) under the tongue every 4 (four) hours as needed for agitation (or delirium).   LORazepam 1 MG tablet Commonly known as: ATIVAN Take 1 tablet (1 mg total) by mouth every 4 (four) hours as needed for anxiety.   morphine 2 MG/ML injection Inject 1 mL (2 mg total) into the vein every 3 (three) hours as needed (signs of pain).   ondansetron 4 MG/2ML Soln injection Commonly known as: ZOFRAN Inject 2 mLs (4 mg total) into the vein every 6 (six) hours as needed for nausea. Replaces: ondansetron 4 MG tablet   pantoprazole 40 MG injection Commonly known as: PROTONIX Inject 40 mg into the vein daily. Start taking on: August 09, 2020 Replaces: pantoprazole 40 MG tablet   polyvinyl alcohol 1.4 % ophthalmic solution Commonly known as: LIQUIFILM TEARS Place 1 drop into both eyes 4 (four) times daily as needed for dry eyes.            Discharge Care Instructions  (From admission, onward)         Start     Ordered   08/17/2020 0000  Discharge wound care:       Comments: Pack left ischium with moist gauze every day and then cover with abdominal pad and tape.  Apply Santyl to middle back to wounds and right ischial, and sacral wound daily then covered with moist gauze and abdominal pad and tape.   08/07/2020 1709         No Known Allergies   The results of significant diagnostics from this hospitalization (including imaging, microbiology, ancillary and laboratory) are listed below for reference.    Significant Diagnostic Studies: CT ABDOMEN PELVIS WO CONTRAST  Result Date: 08/05/2020 CLINICAL DATA:  Abdominal pain.  Decreased responsiveness. EXAM: CT ABDOMEN AND PELVIS WITHOUT CONTRAST TECHNIQUE: Multidetector CT imaging of the abdomen and pelvis was performed following the standard protocol without IV contrast. COMPARISON:  07/19/2011 FINDINGS: LOWER CHEST: There are bilateral basilar  endobronchial opacities, right worse than left. HEPATOBILIARY: No focal abnormality of the liver. Status post cholecystectomy. PANCREAS: Normal pancreas. No ductal dilatation or peripancreatic fluid collection. SPLEEN: Normal. ADRENALS/URINARY TRACT: The adrenal glands are normal. No hydronephrosis, nephroureterolithiasis or solid renal mass. The urinary bladder is normal for degree of distention STOMACH/BOWEL: There is no hiatal hernia. Normal duodenal course and caliber. No small bowel dilatation or inflammation. There is fistulous connection between the rectum and the dorsal skin surface. There is stool extending through this connection. Normal appendix. VASCULAR/LYMPHATIC: Normal course and caliber of the major abdominal vessels. No abdominal or pelvic lymphadenopathy. REPRODUCTIVE: Normal uterus. No adnexal mass. MUSCULOSKELETAL. There is a right ischial decubitus ulcer with osteolysis of the ischial tuberosity. The left ischial tuberosity is also likely exposed the external environment. OTHER: None. IMPRESSION: 1. Bilateral ischial decubitus ulcers with associated underlying osteomyelitis. 2. 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 3. Bilateral basilar endobronchial opacities, right worse than left, concerning for aspiration pneumonia. Electronically Signed   By: Ulyses Jarred M.D.   On: 08/05/2020 02:39   DG Chest Port 1 View  Result Date: 08/04/2020 CLINICAL DATA:  Code sepsis altered low blood  pressure EXAM: PORTABLE CHEST 1 VIEW COMPARISON:  05/31/2020, 05/13/2020 FINDINGS: Left lung is grossly clear. Patchy vague airspace opacities in the right mid lung and lung base. No pleural effusion. Normal heart size. No pneumothorax. IMPRESSION: Patchy vague airspace opacities in the right mid lung and lung base, suspicious for pneumonia. Electronically Signed   By: Donavan Foil M.D.   On: 08/04/2020 21:41   DG Chest Port 1V same Day  Result  Date: 08/04/2020 CLINICAL DATA:  Central line placement. History of multiple sclerosis. EXAM: PORTABLE CHEST 1 VIEW COMPARISON:  08/04/2020 FINDINGS: Heart size and pulmonary vascularity are normal. Right central venous catheter with tip over the cavoatrial junction. Peribronchial thickening with patchy perihilar opacities on the right. This may represent bronchitis or pneumonia. No significant change since previous study, allowing for differences in technique. No pleural effusions. No pneumothorax. Mediastinal contours are normal. Degenerative changes in the spine and shoulders. Surgical clips in the right upper quadrant. IMPRESSION: 1. Right central venous catheter with tip over the cavoatrial junction. No pneumothorax. 2. Peribronchial thickening with patchy perihilar opacities on the right suggesting bronchitis or pneumonia. Electronically Signed   By: Lucienne Capers M.D.   On: 08/04/2020 22:40    Microbiology: Recent Results (from the past 240 hour(s))  Blood Culture (routine x 2)     Status: Abnormal   Collection Time: 08/04/20 10:10 PM   Specimen: BLOOD  Result Value Ref Range Status   Specimen Description   Final    BLOOD CENTRAL LINE Performed at [facility], [address], [place], [place] [address]    Special Requests   Final    BOTTLES DRAWN AEROBIC AND ANAEROBIC Blood Culture adequate volume Performed at [facility], [address] [place], [place] [address]    Culture  Setup Time   Final    GRAM POSITIVE COCCI IN CHAINS IN BOTH AEROBIC AND ANAEROBIC BOTTLES Gram Stain Report Called to,Read Back By and Verified With: FOLEY,B AT 8756 ON 08/05/20 BY HUFFINES,S. CRITICAL RESULT CALLED TO, READ BACK BY AND VERIFIED WITH: B HARRIS RN 2022 08/05/20 A BROWNING GRAM POSITIVE COCCI AEROBIC BOTTLE ONLY RESULT PREV CALLED    Culture (A)  Final    GROUP B STREP(S.AGALACTIAE)ISOLATED STREPTOCOCCUS ANGINOSIS THE SIGNIFICANCE OF ISOLATING THIS ORGANISM FROM A SINGLE SET OF BLOOD CULTURES  WHEN MULTIPLE SETS ARE DRAWN IS UNCERTAIN. PLEASE NOTIFY THE MICROBIOLOGY DEPARTMENT WITHIN ONE WEEK IF SPECIATION AND SENSITIVITIES ARE REQUIRED. Performed at [facility], [place] [address], [place],  [address]    Report Status 08/07/2020 FINAL  Final   Organism ID, Bacteria GROUP B STREP(S.AGALACTIAE)ISOLATED  Final      Susceptibility   Group b strep(s.agalactiae)isolated - MIC*    CLINDAMYCIN >=1 RESISTANT Resistant     AMPICILLIN <=0.25 SENSITIVE Sensitive     ERYTHROMYCIN >=8 RESISTANT Resistant     VANCOMYCIN 0.5 SENSITIVE Sensitive     CEFTRIAXONE <=0.12 SENSITIVE Sensitive     LEVOFLOXACIN 1 SENSITIVE Sensitive     PENICILLIN Value in next row Sensitive      SENSITIVE<=0.06    * GROUP B STREP(S.AGALACTIAE)ISOLATED  Blood Culture ID Panel (Reflexed)     Status: Abnormal   Collection Time: 08/04/20 10:10 PM  Result Value Ref Range Status   Enterococcus faecalis NOT DETECTED NOT DETECTED Final   Enterococcus Faecium NOT DETECTED NOT DETECTED Final   Listeria monocytogenes NOT DETECTED NOT DETECTED Final   Staphylococcus species NOT DETECTED NOT DETECTED Final   Staphylococcus aureus (BCID) NOT  DETECTED NOT DETECTED Final   Staphylococcus epidermidis NOT DETECTED NOT DETECTED Final   Staphylococcus lugdunensis NOT DETECTED NOT DETECTED Final   Streptococcus species DETECTED (A) NOT DETECTED Final    Comment: CRITICAL RESULT CALLED TO, READ BACK BY AND VERIFIED WITH: B HARRIS RN 2022 08/05/20 A BROWNING    Streptococcus agalactiae DETECTED (A) NOT DETECTED Final    Comment: CRITICAL RESULT CALLED TO, READ BACK BY AND VERIFIED WITH: B HARRIS RN 2022 08/05/20 A BROWNING    Streptococcus pneumoniae NOT DETECTED NOT DETECTED Final   Streptococcus pyogenes NOT DETECTED NOT DETECTED Final   A.calcoaceticus-baumannii NOT DETECTED NOT DETECTED Final   Bacteroides fragilis NOT DETECTED NOT DETECTED Final   Enterobacterales NOT DETECTED NOT DETECTED Final   Enterobacter  cloacae complex NOT DETECTED NOT DETECTED Final   Escherichia coli NOT DETECTED NOT DETECTED Final   Klebsiella aerogenes NOT DETECTED NOT DETECTED Final   Klebsiella oxytoca NOT DETECTED NOT DETECTED Final   Klebsiella pneumoniae NOT DETECTED NOT DETECTED Final   Proteus species NOT DETECTED NOT DETECTED Final   Salmonella species NOT DETECTED NOT DETECTED Final   Serratia marcescens NOT DETECTED NOT DETECTED Final   Haemophilus influenzae NOT DETECTED NOT DETECTED Final   Neisseria meningitidis NOT DETECTED NOT DETECTED Final   Pseudomonas aeruginosa NOT DETECTED NOT DETECTED Final   Stenotrophomonas maltophilia NOT DETECTED NOT DETECTED Final   Candida albicans NOT DETECTED NOT DETECTED Final   Candida auris NOT DETECTED NOT DETECTED Final   Candida glabrata NOT DETECTED NOT DETECTED Final   Candida krusei NOT DETECTED NOT DETECTED Final   Candida parapsilosis NOT DETECTED NOT DETECTED Final   Candida tropicalis NOT DETECTED NOT DETECTED Final   Cryptococcus neoformans/gattii NOT DETECTED NOT DETECTED Final    Comment: Performed at [facility], [address], [place], [place] [address]  Respiratory Panel by RT PCR (Flu A&B, Covid) -     Status: None   Collection Time: 08/05/20 12:50 AM  Result Value Ref Range Status   SARS Coronavirus 2 by RT PCR NEGATIVE NEGATIVE Final    Comment: (NOTE) SARS-CoV-2 target nucleic acids are NOT DETECTED.  The SARS-CoV-2 RNA is generally detectable in upper respiratoy specimens during the acute phase of infection. The lowest concentration of SARS-CoV-2 viral copies this assay can detect is 131 copies/mL. A negative result does not preclude SARS-Cov-2 infection and should not be used as the sole basis for treatment or other patient management decisions. A negative result may occur with  improper specimen collection/handling, submission of specimen other than nasopharyngeal swab, presence of viral mutation(s) within the areas targeted by  this assay, and inadequate number of viral copies (<131 copies/mL). A negative result must be combined with clinical observations, patient history, and epidemiological information. The expected result is Negative.  Fact Sheet for Patients:  PinkCheek.be  Fact Sheet for Healthcare Providers:  GravelBags.it  This test is no t yet approved or cleared by the [place] FDA and  has been authorized for detection and/or diagnosis of SARS-CoV-2 by FDA under an Emergency Use Authorization (EUA). This EUA will remain  in effect (meaning this test can be used) for the duration of the COVID-19 declaration under Section 564(b)(1) of the Act, 21 U.S.C. section 360bbb-3(b)(1), unless the authorization is terminated or revoked sooner.     Influenza A by PCR NEGATIVE NEGATIVE Final   Influenza B by PCR NEGATIVE NEGATIVE Final    Comment: (NOTE) The Xpert Xpress SARS-CoV-2/FLU/RSV assay is intended as  an aid in  the diagnosis of influenza from Nasopharyngeal swab specimens and  should not be used as a sole basis for treatment. Nasal washings and  aspirates are unacceptable for Xpert Xpress SARS-CoV-2/FLU/RSV  testing.  Fact Sheet for Patients: PinkCheek.be  Fact Sheet for Healthcare Providers: GravelBags.it  This test is not yet approved or cleared by the [place] FDA and  has been authorized for detection and/or diagnosis of SARS-CoV-2 by  FDA under an Emergency Use Authorization (EUA). This EUA will remain  in effect (meaning this test can be used) for the duration of the  Covid-19 declaration under Section 564(b)(1) of the Act, 21  U.S.C. section 360bbb-3(b)(1), unless the authorization is  terminated or revoked. Performed at [facility], [address], [place], [place] [address]   Blood Culture (routine x 2)     Status: None (Preliminary result)   Collection Time:  08/05/20  3:35 AM   Specimen: BLOOD  Result Value Ref Range Status   Specimen Description BLOOD PICC LINE  Final   Special Requests   Final    BOTTLES DRAWN AEROBIC AND ANAEROBIC Blood Culture adequate volume   Culture   Final    NO GROWTH 3 DAYS Performed at [facility], [address] [place], [place] [address]    Report Status PENDING  Incomplete  Urine culture     Status: Abnormal   Collection Time: 08/05/20 11:37 AM   Specimen: Urine, Catheterized  Result Value Ref Range Status   Specimen Description   Final    URINE, CATHETERIZED Performed at [facility], [address], [place], [place] [address]    Special Requests   Final    NONE Performed at [facility], [address] [place], [place] [address]    Culture 30,000 COLONIES/mL YEAST (A)  Final   Report Status 08/06/2020 FINAL  Final  MRSA PCR Screening     Status: None   Collection Time: 08/05/20  3:30 PM   Specimen: Nasopharyngeal  Result Value Ref Range Status   MRSA by PCR NEGATIVE NEGATIVE Final    Comment:        The GeneXpert MRSA Assay (FDA approved for NASAL specimens only), is one component of a comprehensive MRSA colonization surveillance program. It is not intended to diagnose MRSA infection nor to guide or monitor treatment for MRSA infections. Performed at [facility], [address] [place], [place] [address]      Labs: Basic Metabolic Panel: Recent Labs  Lab 08/04/20 2210 08/05/20 0335  NA 151* 146*  K 4.9 4.0  CL 113* 113*  CO2 19* 19*  GLUCOSE 109* 127*  BUN 46* 40*  CREATININE 1.57* 1.25*  CALCIUM 8.9 8.0*  MG  --  1.6*  PHOS  --  2.4*   Liver Function Tests: Recent Labs  Lab 08/04/20 2210 08/05/20 0335  AST 49* 187*  ALT <5 48*  ALKPHOS 134* 104  BILITOT 0.6 0.7  PROT 6.2* 4.7*  ALBUMIN 1.7* 1.3*   CBC: Recent Labs  Lab 08/04/20 2210 08/05/20 0335  WBC 8.6 15.7*  NEUTROABS 7.6 14.2*  HGB 8.6* 7.3*  HCT 30.2* 24.9*  MCV 101.0* 98.8  PLT 82* 73*   BNP  (last 3 results) Recent Labs    05/15/20 0750 05/16/20 0659 05/17/20 0435  BNP 149.8* 225.9* 99.0    Signed:  Barton Dubois MD.  [facility] 08/27/2020, 5:12 PM

## 2020-08-08 NOTE — Progress Notes (Signed)
PROGRESS NOTE    Tracy Armstrong  MRN:[id number] DOB: 08/15/1946 DOA: 08/04/2020 PCP: Hasanaj, Xaje A, MD    Chief Complaint  Patient presents with  . Altered Mental Status    Brief Narrative:  As per H&P written by Dr. Zierle Ghosh on 08/05/20 Tracy Armstrong  is a 74 y.o. female, with history of multiple sclerosis and bilateral AKA's presents to the ED with her daughters for decreased responsiveness, and trouble breathing.  Daughter reports that patient has not been verbal, eating adequately, responding to them in 6 weeks.  She thinks the patient had a decrease in alertness 2 weeks ago, and she started having trouble breathing yesterday.  They have not noticed a cough.  They do not think the patient has had a fever at home.  She has not indicated any pain in any way, but she does not communicate.  They report that she has been drinking ensures, reluctantly.  No further history can be obtained . In the ED Temperature was 100.9, heart rate is 129, respiratory rate 26, oxygen sats are likely not accurate given low measurements at 33%, but a PO2 of 121 on ABG White blood cell count 8.6, hemoglobin 8.6 CHEM panel -sodium 151, potassium 4.9, BUN 46, creatinine 1.57, elevated alk phos at 134, elevated anion gap at 19, albumin is 1.7 Blood culture pending Covid and flu negative Chest x-ray shows right-sided pneumonia CT abdomen pelvis pending Broad spectrum antibiotics and fluid bolus in ED per sepsis protocol   Assessment & Plan: 1-severe sepsis with septic shock: In the setting of aspiration pneumonia, Streptococcus agalactiae bacteremia and bilateral ischial osteomyelitis -POA -Despite fluid resuscitation, pressor support and broad-spectrum antibiotics patient continue to be critical and her pressure failed to response without the assistance on pressors -After long discussion with family members they understood that any intervention despite how invasive it is will end up being futile to  patient's condition and long-term prognosis. -Decision was made to transition to full comfort and symptomatic management only -Antibiotics and pressors discontinued -End-of-life care protocol initiated -Anticipate hospital death; but overall life expectancy is less than 6 weeks.. -Family in agreement and aware that if the patient condition plateau they would like to pursuit residential hospice placement.  2-metabolic encephalopathy/lactic acidosis -In the setting of poor organ perfusion with ongoing septic shock and infection -Plan is for full comfort care. -Patient has remained obtunded despite maximal effort and treatment provided.  3-no unstageable decubitus/sacral pressure ulcers: POA -With CT scan demonstrating osteomyelitis in bilateral ischium -Will continue constant repositioning for comfort purposes -End-of-life care protocol. -Case discussed with surgical surgery who fell no point of debridement at this time; as intervention will be painful unable to be overall futile to patient current condition and prognosis..  4-acute kidney injury -In the setting of poor perfusion and hypotension from septic shock -Patient received aggressive fluid resuscitation -At this time no further blood work will be drawn -Will focus on comfort care.  5-hypernatremia -In the setting of poor oral intake and dehydration -Fluid resuscitation was given -At this time moving to comfort care and symptomatic management only -No further blood work will be drawn.  6-severe protein calorie malnutrition -Comfort care -Patient albumin 1.7  7-elevated troponin and presentation -Most likely demand ischemia -No acute ischemic changes appreciated on EKG or telemetry. -We will focus on comfort care -MedSurg bed requested.  8-toxic encephalopathy -Patient has remained obtunded -Will focus on comfort care as per goals of care discussion with patient.  9-DNR/DNI -Long goals   of care discussion with family  members at bedside -End-of-life protocol initiated -Extremely poor prognosis -Will focus on comfort care only.  DVT prophylaxis: None (comfort care only). Code Status: DNR/DNI Family Communication: Daughters is at bedside Disposition:   Status is: Inpatient  Dispo: The patient is from: Home              Anticipated d/c is to: Hospice home if patient plateau and not die in the hospital              Anticipated d/c date is: to be determined (but hospital death anticipated)              Patient currently is not medically stable for discharge; extremely poor prognosis and anticipated hospital death; after long discussion with family members at bedside decision has been made to pursuit comfort and symptomatic management only; pressors support will be discontinue, antibiotics will be stopped, patient DNR/DNI and with transition to end-of-life care protocol.  If stable can be transfer to hospice home if bed available.       Consultants:   Palliative care  Hospice  General surgery   Procedures:  See below for x-ray reports.   Antimicrobials:  Cefepime, vancomycin and Flagyl: At this moment all antibiotics will be discontinued and patient will be transition to full comfort.   Subjective: No following commands, no eating, not drinking.  Overall appears to be comfortable currently.  Low blood pressure and decrease oxygen saturation appreciated on examination.  Using 2-3 L nasal cannula supplementation at this time.   Objective: Vitals:   08/10/2020 0300 08/30/2020 0400 08/26/2020 0500 08/07/2020 0600  BP:      Pulse: 71     Resp: _0 Temp:      TempSrc:      SpO2: (!) 72%     Weight:      Height:       No intake or output data in the 24 hours ending 08/28/2020 1145 Filed Weights   08/05/20 1720 08/06/20 0400  Weight: 40.8 kg 43.6 kg    Examination: General exam: Afebrile, unresponsive; not following commands, no eating, not drinking, very frail, underweight and  chronically ill in appearance. Patient with low BP and now decrease O2 saturation. Respiratory system: No crackles, no wheezing, positive rhonchi. 2-3 O2 supplementation for comfort currently. Cardiovascular system: rate controlled, no rubs, no gallops. Gastrointestinal system: Abdomen is nondistended, soft and nontender. No organomegaly or masses felt. Normal bowel sounds heard. Central nervous system: unable to properly assess secondary to ongoing unresponsiveness  Extremities: bilateral AKA. Skin: multiple unstageable pressure injury and open wounds affecting sacrum and ischial area. All wounds present on admission.  Psychiatry: unresponsive.   Data Reviewed: I have personally reviewed following labs and imaging studies  CBC: Recent Labs  Lab 08/04/20 2210 08/05/20 0335  WBC 8.6 15.7*  NEUTROABS 7.6 14.2*  HGB 8.6* 7.3*  HCT 30.2* 24.9*  MCV 101.0* 98.8  PLT 82* 73*    Basic Metabolic Panel: Recent Labs  Lab 08/04/20 2210 08/05/20 0335  NA 151* 146*  K 4.9 4.0  CL 113* 113*  CO2 19* 19*  GLUCOSE 109* 127*  BUN 46* 40*  CREATININE 1.57* 1.25*  CALCIUM 8.9 8.0*  MG  --  1.6*  PHOS  --  2.4*    GFR: Estimated Creatinine Clearance: 7.4 mL/min (A) (by C-G formula based on SCr of 1.25 mg/dL (H)).  Liver Function Tests: Recent Labs  Lab 08/04/20 2210  08/05/20 0335  AST 49* 187*  ALT <5 48*  ALKPHOS 134* 104  BILITOT 0.6 0.7  PROT 6.2* 4.7*  ALBUMIN 1.7* 1.3*    CBG: No results for input(s): GLUCAP in the last 168 hours.   Recent Results (from the past 240 hour(s))  Blood Culture (routine x 2)     Status: Abnormal   Collection Time: 08/04/20 10:10 PM   Specimen: BLOOD  Result Value Ref Range Status   Specimen Description   Final    BLOOD CENTRAL LINE Performed at [facility], [address] [place], [place] [address]    Special Requests   Final    BOTTLES DRAWN AEROBIC AND ANAEROBIC Blood Culture adequate volume Performed at [facility],  [address] [place], [place] [address]    Culture  Setup Time   Final    GRAM POSITIVE COCCI IN CHAINS IN BOTH AEROBIC AND ANAEROBIC BOTTLES Gram Stain Report Called to,Read Back By and Verified With: FOLEY,B AT 4742 ON 08/05/20 BY HUFFINES,S. CRITICAL RESULT CALLED TO, READ BACK BY AND VERIFIED WITH: B HARRIS RN 2022 08/05/20 A BROWNING GRAM POSITIVE COCCI AEROBIC BOTTLE ONLY RESULT PREV CALLED    Culture (A)  Final    GROUP B STREP(S.AGALACTIAE)ISOLATED STREPTOCOCCUS ANGINOSIS THE SIGNIFICANCE OF ISOLATING THIS ORGANISM FROM A SINGLE SET OF BLOOD CULTURES WHEN MULTIPLE SETS ARE DRAWN IS UNCERTAIN. PLEASE NOTIFY THE MICROBIOLOGY DEPARTMENT WITHIN ONE WEEK IF SPECIATION AND SENSITIVITIES ARE REQUIRED. Performed at [facility], [place] [address] [place], [place] [address]    Report Status 08/07/2020 FINAL  Final   Organism ID, Bacteria GROUP B STREP(S.AGALACTIAE)ISOLATED  Final      Susceptibility   Group b strep(s.agalactiae)isolated - MIC*    CLINDAMYCIN >=1 RESISTANT Resistant     AMPICILLIN <=0.25 SENSITIVE Sensitive     ERYTHROMYCIN >=8 RESISTANT Resistant     VANCOMYCIN 0.5 SENSITIVE Sensitive     CEFTRIAXONE <=0.12 SENSITIVE Sensitive     LEVOFLOXACIN 1 SENSITIVE Sensitive     PENICILLIN Value in next row Sensitive      SENSITIVE<=0.06    * GROUP B STREP(S.AGALACTIAE)ISOLATED  Blood Culture ID Panel (Reflexed)     Status: Abnormal   Collection Time: 08/04/20 10:10 PM  Result Value Ref Range Status   Enterococcus faecalis NOT DETECTED NOT DETECTED Final   Enterococcus Faecium NOT DETECTED NOT DETECTED Final   Listeria monocytogenes NOT DETECTED NOT DETECTED Final   Staphylococcus species NOT DETECTED NOT DETECTED Final   Staphylococcus aureus (BCID) NOT DETECTED NOT DETECTED Final   Staphylococcus epidermidis NOT DETECTED NOT DETECTED Final   Staphylococcus lugdunensis NOT DETECTED NOT DETECTED Final   Streptococcus species DETECTED (A) NOT DETECTED Final    Comment:  CRITICAL RESULT CALLED TO, READ BACK BY AND VERIFIED WITH: B HARRIS RN 2022 08/05/20 A BROWNING    Streptococcus agalactiae DETECTED (A) NOT DETECTED Final    Comment: CRITICAL RESULT CALLED TO, READ BACK BY AND VERIFIED WITH: B HARRIS RN 2022 08/05/20 A BROWNING    Streptococcus pneumoniae NOT DETECTED NOT DETECTED Final   Streptococcus pyogenes NOT DETECTED NOT DETECTED Final   A.calcoaceticus-baumannii NOT DETECTED NOT DETECTED Final   Bacteroides fragilis NOT DETECTED NOT DETECTED Final   Enterobacterales NOT DETECTED NOT DETECTED Final   Enterobacter cloacae complex NOT DETECTED NOT DETECTED Final   Escherichia coli NOT DETECTED NOT DETECTED Final   Klebsiella aerogenes NOT DETECTED NOT DETECTED Final   Klebsiella oxytoca NOT DETECTED NOT DETECTED Final   Klebsiella  pneumoniae NOT DETECTED NOT DETECTED Final   Proteus species NOT DETECTED NOT DETECTED Final   Salmonella species NOT DETECTED NOT DETECTED Final   Serratia marcescens NOT DETECTED NOT DETECTED Final   Haemophilus influenzae NOT DETECTED NOT DETECTED Final   Neisseria meningitidis NOT DETECTED NOT DETECTED Final   Pseudomonas aeruginosa NOT DETECTED NOT DETECTED Final   Stenotrophomonas maltophilia NOT DETECTED NOT DETECTED Final   Candida albicans NOT DETECTED NOT DETECTED Final   Candida auris NOT DETECTED NOT DETECTED Final   Candida glabrata NOT DETECTED NOT DETECTED Final   Candida krusei NOT DETECTED NOT DETECTED Final   Candida parapsilosis NOT DETECTED NOT DETECTED Final   Candida tropicalis NOT DETECTED NOT DETECTED Final   Cryptococcus neoformans/gattii NOT DETECTED NOT DETECTED Final    Comment: Performed at [facility], [place] [address] [place], [place] [address]  Respiratory Panel by RT PCR (Flu A&B, Covid) -     Status: None   Collection Time: 08/05/20 12:50 AM  Result Value Ref Range Status   SARS Coronavirus 2 by RT PCR NEGATIVE NEGATIVE Final    Comment: (NOTE) SARS-CoV-2 target nucleic acids  are NOT DETECTED.  The SARS-CoV-2 RNA is generally detectable in upper respiratoy specimens during the acute phase of infection. The lowest concentration of SARS-CoV-2 viral copies this assay can detect is 131 copies/mL. A negative result does not preclude SARS-Cov-2 infection and should not be used as the sole basis for treatment or other patient management decisions. A negative result may occur with  improper specimen collection/handling, submission of specimen other than nasopharyngeal swab, presence of viral mutation(s) within the areas targeted by this assay, and inadequate number of viral copies (<131 copies/mL). A negative result must be combined with clinical observations, patient history, and epidemiological information. The expected result is Negative.  Fact Sheet for Patients:  PinkCheek.be  Fact Sheet for Healthcare Providers:  GravelBags.it  This test is no t yet approved or cleared by the [place] FDA and  has been authorized for detection and/or diagnosis of SARS-CoV-2 by FDA under an Emergency Use Authorization (EUA). This EUA will remain  in effect (meaning this test can be used) for the duration of the COVID-19 declaration under Section 564(b)(1) of the Act, 21 U.S.C. section 360bbb-3(b)(1), unless the authorization is terminated or revoked sooner.     Influenza A by PCR NEGATIVE NEGATIVE Final   Influenza B by PCR NEGATIVE NEGATIVE Final    Comment: (NOTE) The Xpert Xpress SARS-CoV-2/FLU/RSV assay is intended as an aid in  the diagnosis of influenza from Nasopharyngeal swab specimens and  should not be used as a sole basis for treatment. Nasal washings and  aspirates are unacceptable for Xpert Xpress SARS-CoV-2/FLU/RSV  testing.  Fact Sheet for Patients: PinkCheek.be  Fact Sheet for Healthcare Providers: GravelBags.it  This test is not  yet approved or cleared by the [place] FDA and  has been authorized for detection and/or diagnosis of SARS-CoV-2 by  FDA under an Emergency Use Authorization (EUA). This EUA will remain  in effect (meaning this test can be used) for the duration of the  Covid-19 declaration under Section 564(b)(1) of the Act, 21  U.S.C. section 360bbb-3(b)(1), unless the authorization is  terminated or revoked. Performed at [facility], [address], [place], [place] [address]   Blood Culture (routine x 2)     Status: None (Preliminary result)   Collection Time: 08/05/20  3:35 AM   Specimen: BLOOD  Result Value Ref Range  Status   Specimen Description BLOOD PICC LINE  Final   Special Requests   Final    BOTTLES DRAWN AEROBIC AND ANAEROBIC Blood Culture adequate volume   Culture   Final    NO GROWTH 3 DAYS Performed at [facility], [address] [place], [place] [address]    Report Status PENDING  Incomplete  Urine culture     Status: Abnormal   Collection Time: 08/05/20 11:37 AM   Specimen: Urine, Catheterized  Result Value Ref Range Status   Specimen Description   Final    URINE, CATHETERIZED Performed at [facility], [address] [place], [place] [address]    Special Requests   Final    NONE Performed at [facility], [address], [place], [place] [address]    Culture 30,000 COLONIES/mL YEAST (A)  Final   Report Status 08/06/2020 FINAL  Final  MRSA PCR Screening     Status: None   Collection Time: 08/05/20  3:30 PM   Specimen: Nasopharyngeal  Result Value Ref Range Status   MRSA by PCR NEGATIVE NEGATIVE Final    Comment:        The GeneXpert MRSA Assay (FDA approved for NASAL specimens only), is one component of a comprehensive MRSA colonization surveillance program. It is not intended to diagnose MRSA infection nor to guide or monitor treatment for MRSA infections. Performed at [facility], [address] [place],  [address]      Radiology Studies: No  results found.  Scheduled Meds: . Chlorhexidine Gluconate Cloth  6 each Topical Daily  . collagenase   Topical Daily  . docusate sodium  100 mg Oral BID  . feeding supplement (ENSURE ENLIVE)  237 mL Oral BID BM  . pantoprazole (PROTONIX) IV  40 mg Intravenous Q24H  . sodium chloride flush  3 mL Intravenous Q12H   Continuous Infusions: . sodium chloride       LOS: 3 days    Time spent: 30 minutes    Barton Dubois, MD [facility]   To contact the attending provider between 7A-7P or the covering provider during after hours 7P-7A, please log into the web site [email or web address] and access using universal [place] password for that web site. If you do not have the password, please call the hospital operator.  08/28/2020, 11:45 AM

## 2020-08-08 NOTE — TOC Progression Note (Signed)
Transition of Care [facility]) - Progression Note    Patient Details  Name: Tracy Armstrong MRN: [id number] Date of Birth: 31-Mar-1946  Transition of Care [facility]) CM/SW Contact  Salome Arnt, [place] Phone Number: 09/02/2020, 2:22 PM  Clinical Narrative:  LCSW spoke with Colletta Maryland at [facility] this morning. Per MD, pt not stable for transport to [facility]. Hospice met with family and consents have been signed for GIP. MD and RN notified to flip chart. TOC to follow.        Barriers to Discharge: No Barriers Identified  Expected Discharge Plan and Services                                                 Social Determinants of Health (SDOH) Interventions    Readmission Risk Interventions No flowsheet data found.

## 2020-08-08 NOTE — Progress Notes (Signed)
SLP Cancellation Note  Patient Details Name: RAJAH LAMBA MRN: [id number] DOB: 12/24/1945   Cancelled treatment:       Reason Eval/Treat Not Completed: Other (comment) (Pt full comfort at this time. SLP will sign off. )   Thank you,  Havery Moros, CCC-SLP [phone]    Shanece Cochrane 2020/09/02, 2:30 PM

## 2020-08-09 DIAGNOSIS — Z515 Encounter for palliative care: Principal | ICD-10-CM

## 2020-08-09 MED ORDER — BISACODYL 10 MG RE SUPP: 10.0000 mg | Freq: Every day | RECTAL | Status: DC | PRN

## 2020-08-09 MED ORDER — SCOPOLAMINE 1 MG/3DAYS TD PT72
1.0000 | MEDICATED_PATCH | TRANSDERMAL | Status: DC
  Administered 2020-08-09: 1.5 mg via TRANSDERMAL
  Filled 2020-08-09: qty 1

## 2020-08-09 NOTE — Progress Notes (Signed)
Pt remains awake, opens and closes eyes but no verbal response and no eye contact. Multiple family members have been in today to visit. Was able to perform mouth care this am but not this afternoon as each time I approach pt's mouth with swab she closes her mouth and will not open. No visible sign or display of pain or discomfort. Resp even and nonlabored, skin warm and dry.

## 2020-08-09 NOTE — Progress Notes (Signed)
PROGRESS NOTE    Tracy Armstrong  NID:[id number] DOB: [birth year] DOA: 08/17/2020 PCP: Neale Burly, MD    No chief complaint on file.   Brief Narrative:  As per H&P written by Dr. Clearence Ped on 08/05/20 Tracy Armstrong  is a 74 y.o. female, with history of multiple sclerosis and bilateral AKA's presents to the ED with her daughters for decreased responsiveness, and trouble breathing.  Daughter reports that patient has not been verbal, eating adequately, responding to them in 6 weeks.  She thinks the patient had a decrease in alertness 2 weeks ago, and she started having trouble breathing yesterday.  They have not noticed a cough.  They do not think the patient has had a fever at home.  She has not indicated any pain in any way, but she does not communicate.  They report that she has been drinking ensures, reluctantly.  No further history can be obtained . In the ED Temperature was 100.9, heart rate is 129, respiratory rate 26, oxygen sats are likely not accurate given low measurements at 33%, but a PO2 of 121 on ABG White blood cell count 8.6, hemoglobin 8.6 CHEM panel -sodium 151, potassium 4.9, BUN 46, creatinine 1.57, elevated alk phos at 134, elevated anion gap at 19, albumin is 1.7 Blood culture pending Covid and flu negative Chest x-ray shows right-sided pneumonia CT abdomen pelvis pending Broad spectrum antibiotics and fluid bolus in ED per sepsis protocol   Assessment & Plan: 1-severe sepsis with septic shock: In the setting of aspiration pneumonia, Streptococcus agalactiae bacteremia and bilateral ischial osteomyelitis -POA -Despite fluid resuscitation, pressor support and broad-spectrum antibiotics patient continue to be critical and her pressure failed to response without the assistance on pressors -After long discussion with family members they understood that any intervention despite how invasive it is will end up being futile to patient's condition and long-term  prognosis. -Decision was made to transition to full comfort and symptomatic management only -Antibiotics and pressors discontinued -End-of-life care protocol initiated -Anticipate hospital death; but overall life expectancy is less than 6 weeks.. -Family in agreement and aware that if the patient condition plateau they would like to pursuit residential hospice placement.  2-metabolic encephalopathy/lactic acidosis -In the setting of poor organ perfusion with ongoing septic shock and infection -Plan is for full comfort care. -Patient has remained obtunded despite maximal effort and treatment provided.  3-no unstageable decubitus/sacral pressure ulcers: POA -With CT scan demonstrating osteomyelitis in bilateral ischium -Will continue constant repositioning for comfort purposes -End-of-life care protocol. -Case discussed with surgical surgery who fell no point of debridement at this time; as intervention will be painful unable to be overall futile to patient current condition and prognosis.Marland Kitchen  4-acute kidney injury -In the setting of poor perfusion and hypotension from septic shock -Patient received aggressive fluid resuscitation -At this time no further blood work will be drawn -Will focus on comfort care.  5-hypernatremia -In the setting of poor oral intake and dehydration -Fluid resuscitation was given -At this time moving to comfort care and symptomatic management only -No further blood work will be drawn.  6-severe protein calorie malnutrition -Comfort care -Patient albumin 1.7  7-elevated troponin and presentation -Most likely demand ischemia -No acute ischemic changes appreciated on EKG or telemetry. -We will focus on comfort care -MedSurg bed requested.  8-toxic encephalopathy -Patient has remained obtunded -Will focus on comfort care as per goals of care discussion with patient.  9-DNR/DNI -Long goals of care discussion with family members at  bedside -End-of-life  protocol initiated -Extremely poor prognosis -Will focus on comfort care only.  10-increased upper airway secretions -Started on scopolamine patch. -continue robinul   DVT prophylaxis: None (comfort care only). Code Status: DNR/DNI Family Communication: Daughters is at bedside Disposition:   Status is: Inpatient  Dispo: The patient is from: Home              Anticipated d/c is to: Hospice home if patient plateau and not die in the hospital              Anticipated d/c date is: to be determined (but hospital death anticipated)              Patient currently is not medically stable for discharge; extremely poor prognosis and anticipated hospital death; continue end-of-life care.  Patient is now GIP; too frail/fragile for transportation to hospice at this time.     Consultants:   Palliative care  Hospice  General surgery   Procedures:  See below for x-ray reports.   Antimicrobials:  Cefepime, vancomycin and Flagyl: At this moment all antibiotics will be discontinued and patient will be transition to full comfort.   Subjective: No following commands, no eating, not drinking.  Oxygen saturation 80%, respiratory rate intermittently 25-27/min.  Heart rate in the 50s.  Increase upper airway secretions appreciated on examination.   Objective: Vitals:   08/13/2020 1900 08/14/2020 2000 08/13/2020 2100 08/09/20 0000  Pulse: (!) 31 (!) 53 (!) 109 (!) 31  Resp: (!) 25 (!) 24 20 (!) 25  SpO2: (!) 66% (!) 65% (!) 81% (!) 61%   No intake or output data in the 24 hours ending 08/09/20 1312 There were no vitals filed for this visit.  Examination: General exam: Afebrile, appears in no major distress.  Unable to follow commands currently.  Increase upper airway secretions appreciated on examination. Respiratory system: Oxygen supplementation in place; no wheezing, no crackles. Cardiovascular system: No rubs, no gallops, no murmurs or JVD appreciated on exam. Gastrointestinal system:  Abdomen is nondistended, soft and nontender. No organomegaly or masses felt. Normal bowel sounds heard. Central nervous system: Unable to properly address; patient is not following any commands. Extremities: Bilateral AKA Skin: Unchanged multiple unstageable pressure injuries and open wounds affecting sacrum, bilateral ischium and midthoracic back.  All wounds were present on admission. Psychiatry: Unresponsive.   Data Reviewed: I have personally reviewed following labs and imaging studies  CBC: Recent Labs  Lab 08/04/20 2210 08/05/20 0335  WBC 8.6 15.7*  NEUTROABS 7.6 14.2*  HGB 8.6* 7.3*  HCT 30.2* 24.9*  MCV 101.0* 98.8  PLT 82* 73*    Basic Metabolic Panel: Recent Labs  Lab 08/04/20 2210 08/05/20 0335  NA 151* 146*  K 4.9 4.0  CL 113* 113*  CO2 19* 19*  GLUCOSE 109* 127*  BUN 46* 40*  CREATININE 1.57* 1.25*  CALCIUM 8.9 8.0*  MG  --  1.6*  PHOS  --  2.4*    GFR: Estimated Creatinine Clearance: 7.4 mL/min (A) (by C-G formula based on SCr of 1.25 mg/dL (H)).  Liver Function Tests: Recent Labs  Lab 08/04/20 2210 08/05/20 0335  AST 49* 187*  ALT <5 48*  ALKPHOS 134* 104  BILITOT 0.6 0.7  PROT 6.2* 4.7*  ALBUMIN 1.7* 1.3*    CBG: No results for input(s): GLUCAP in the last 168 hours.   Recent Results (from the past 240 hour(s))  Blood Culture (routine x 2)     Status: Abnormal  Collection Time: 08/04/20 10:10 PM   Specimen: BLOOD  Result Value Ref Range Status   Specimen Description   Final    BLOOD CENTRAL LINE Performed at [facility], [address] [place], [place] [address]    Special Requests   Final    BOTTLES DRAWN AEROBIC AND ANAEROBIC Blood Culture adequate volume Performed at [facility], [address] [place], [place] [address]    Culture  Setup Time   Final    GRAM POSITIVE COCCI IN CHAINS IN BOTH AEROBIC AND ANAEROBIC BOTTLES Gram Stain Report Called to,Read Back By and Verified With: FOLEY,B AT [id number] ON 08/05/20 BY  HUFFINES,S. CRITICAL RESULT CALLED TO, READ BACK BY AND VERIFIED WITH: B HARRIS RN 2022 08/05/20 A BROWNING GRAM POSITIVE COCCI AEROBIC BOTTLE ONLY RESULT PREV CALLED    Culture (A)  Final    GROUP B STREP(S.AGALACTIAE)ISOLATED STREPTOCOCCUS ANGINOSIS THE SIGNIFICANCE OF ISOLATING THIS ORGANISM FROM A SINGLE SET OF BLOOD CULTURES WHEN MULTIPLE SETS ARE DRAWN IS UNCERTAIN. PLEASE NOTIFY THE MICROBIOLOGY DEPARTMENT WITHIN ONE WEEK IF SPECIATION AND SENSITIVITIES ARE REQUIRED. Performed at [facility], [place] [address] [place], [place] [address]    Report Status 08/07/2020 FINAL  Final   Organism ID, Bacteria GROUP B STREP(S.AGALACTIAE)ISOLATED  Final      Susceptibility   Group b strep(s.agalactiae)isolated - MIC*    CLINDAMYCIN >=1 RESISTANT Resistant     AMPICILLIN <=0.25 SENSITIVE Sensitive     ERYTHROMYCIN >=8 RESISTANT Resistant     VANCOMYCIN 0.5 SENSITIVE Sensitive     CEFTRIAXONE <=0.12 SENSITIVE Sensitive     LEVOFLOXACIN 1 SENSITIVE Sensitive     PENICILLIN Value in next row Sensitive      SENSITIVE<=0.06    * GROUP B STREP(S.AGALACTIAE)ISOLATED  Blood Culture ID Panel (Reflexed)     Status: Abnormal   Collection Time: 08/04/20 10:10 PM  Result Value Ref Range Status   Enterococcus faecalis NOT DETECTED NOT DETECTED Final   Enterococcus Faecium NOT DETECTED NOT DETECTED Final   Listeria monocytogenes NOT DETECTED NOT DETECTED Final   Staphylococcus species NOT DETECTED NOT DETECTED Final   Staphylococcus aureus (BCID) NOT DETECTED NOT DETECTED Final   Staphylococcus epidermidis NOT DETECTED NOT DETECTED Final   Staphylococcus lugdunensis NOT DETECTED NOT DETECTED Final   Streptococcus species DETECTED (A) NOT DETECTED Final    Comment: CRITICAL RESULT CALLED TO, READ BACK BY AND VERIFIED WITH: B HARRIS RN 2022 08/05/20 A BROWNING    Streptococcus agalactiae DETECTED (A) NOT DETECTED Final    Comment: CRITICAL RESULT CALLED TO, READ BACK BY AND VERIFIED WITH: B  HARRIS RN 2022 08/05/20 A BROWNING    Streptococcus pneumoniae NOT DETECTED NOT DETECTED Final   Streptococcus pyogenes NOT DETECTED NOT DETECTED Final   A.calcoaceticus-baumannii NOT DETECTED NOT DETECTED Final   Bacteroides fragilis NOT DETECTED NOT DETECTED Final   Enterobacterales NOT DETECTED NOT DETECTED Final   Enterobacter cloacae complex NOT DETECTED NOT DETECTED Final   Escherichia coli NOT DETECTED NOT DETECTED Final   Klebsiella aerogenes NOT DETECTED NOT DETECTED Final   Klebsiella oxytoca NOT DETECTED NOT DETECTED Final   Klebsiella pneumoniae NOT DETECTED NOT DETECTED Final   Proteus species NOT DETECTED NOT DETECTED Final   Salmonella species NOT DETECTED NOT DETECTED Final   Serratia marcescens NOT DETECTED NOT DETECTED Final   Haemophilus influenzae NOT DETECTED NOT DETECTED Final   Neisseria meningitidis NOT DETECTED NOT DETECTED Final   Pseudomonas aeruginosa NOT DETECTED NOT DETECTED Final   Stenotrophomonas  maltophilia NOT DETECTED NOT DETECTED Final   Candida albicans NOT DETECTED NOT DETECTED Final   Candida auris NOT DETECTED NOT DETECTED Final   Candida glabrata NOT DETECTED NOT DETECTED Final   Candida krusei NOT DETECTED NOT DETECTED Final   Candida parapsilosis NOT DETECTED NOT DETECTED Final   Candida tropicalis NOT DETECTED NOT DETECTED Final   Cryptococcus neoformans/gattii NOT DETECTED NOT DETECTED Final    Comment: Performed at [facility], [place] [address] [place], [place] [address]  Respiratory Panel by RT PCR (Flu A&B, Covid) -     Status: None   Collection Time: 08/05/20 12:50 AM  Result Value Ref Range Status   SARS Coronavirus 2 by RT PCR NEGATIVE NEGATIVE Final    Comment: (NOTE) SARS-CoV-2 target nucleic acids are NOT DETECTED.  The SARS-CoV-2 RNA is generally detectable in upper respiratoy specimens during the acute phase of infection. The lowest concentration of SARS-CoV-2 viral copies this assay can detect is 131 copies/mL. A  negative result does not preclude SARS-Cov-2 infection and should not be used as the sole basis for treatment or other patient management decisions. A negative result may occur with  improper specimen collection/handling, submission of specimen other than nasopharyngeal swab, presence of viral mutation(s) within the areas targeted by this assay, and inadequate number of viral copies (<131 copies/mL). A negative result must be combined with clinical observations, patient history, and epidemiological information. The expected result is Negative.  Fact Sheet for Patients:  PinkCheek.be  Fact Sheet for Healthcare Providers:  GravelBags.it  This test is no t yet approved or cleared by the [place] FDA and  has been authorized for detection and/or diagnosis of SARS-CoV-2 by FDA under an Emergency Use Authorization (EUA). This EUA will remain  in effect (meaning this test can be used) for the duration of the COVID-19 declaration under Section 564(b)(1) of the Act, 21 U.S.C. section 360bbb-3(b)(1), unless the authorization is terminated or revoked sooner.     Influenza A by PCR NEGATIVE NEGATIVE Final   Influenza B by PCR NEGATIVE NEGATIVE Final    Comment: (NOTE) The Xpert Xpress SARS-CoV-2/FLU/RSV assay is intended as an aid in  the diagnosis of influenza from Nasopharyngeal swab specimens and  should not be used as a sole basis for treatment. Nasal washings and  aspirates are unacceptable for Xpert Xpress SARS-CoV-2/FLU/RSV  testing.  Fact Sheet for Patients: PinkCheek.be  Fact Sheet for Healthcare Providers: GravelBags.it  This test is not yet approved or cleared by the [place] FDA and  has been authorized for detection and/or diagnosis of SARS-CoV-2 by  FDA under an Emergency Use Authorization (EUA). This EUA will remain  in effect (meaning this test can  be used) for the duration of the  Covid-19 declaration under Section 564(b)(1) of the Act, 21  U.S.C. section 360bbb-3(b)(1), unless the authorization is  terminated or revoked. Performed at [facility], [address] [place], [place] [address]   Blood Culture (routine x 2)     Status: None (Preliminary result)   Collection Time: 08/05/20  3:35 AM   Specimen: BLOOD  Result Value Ref Range Status   Specimen Description BLOOD PICC LINE  Final   Special Requests   Final    BOTTLES DRAWN AEROBIC AND ANAEROBIC Blood Culture adequate volume   Culture   Final    NO GROWTH 4 DAYS Performed at [facility], [address] [place], [place] [address]    Report Status PENDING  Incomplete  Urine  culture     Status: Abnormal   Collection Time: 08/05/20 11:37 AM   Specimen: Urine, Catheterized  Result Value Ref Range Status   Specimen Description   Final    URINE, CATHETERIZED Performed at [facility], [address], [place], [place] [address]    Special Requests   Final    NONE Performed at [facility], [address] [place], [place] [address]    Culture 30,000 COLONIES/mL YEAST (A)  Final   Report Status 08/06/2020 FINAL  Final  MRSA PCR Screening     Status: None   Collection Time: 08/05/20  3:30 PM   Specimen: Nasopharyngeal  Result Value Ref Range Status   MRSA by PCR NEGATIVE NEGATIVE Final    Comment:        The GeneXpert MRSA Assay (FDA approved for NASAL specimens only), is one component of a comprehensive MRSA colonization surveillance program. It is not intended to diagnose MRSA infection nor to guide or monitor treatment for MRSA infections. Performed at [facility], [address], [place], [place] [address]      Radiology Studies: No results found.  Scheduled Meds: . Chlorhexidine Gluconate Cloth  6 each Topical Daily  . collagenase   Topical Daily  . feeding supplement (ENSURE ENLIVE)  237 mL Oral BID BM  . pantoprazole (PROTONIX) IV  40 mg Intravenous  Q24H  . scopolamine  1 patch Transdermal Q72H  . sodium chloride flush  3 mL Intravenous Q12H   Continuous Infusions: . sodium chloride       LOS: 1 day    Time spent: 25 minutes    Barton Dubois, MD [facility]   To contact the attending provider between 7A-7P or the covering provider during after hours 7P-7A, please log into the web site [email or web address] and access using universal [place] password for that web site. If you do not have the password, please call the hospital operator.  08/09/2020, 1:12 PM

## 2020-08-09 NOTE — H&P (Signed)
History and Physical    Tracy Armstrong ULA:[id number] DOB: 1945/11/21 DOA: 08/04/2020  PCP: Neale Burly, MD   Patient coming from: Came from home initially; transition to [facility] after admitted to the hospital.  Initially no beds at [place] and subsequently too frail/fragile for transportation.  I have personally briefly reviewed patient's old medical records in [place]  Chief Complaint: End-of-life care.  HPI:  As per H&P written by Dr. Clearence Ped on 08/05/20 [name] a89 y.o.female,with history of multiple sclerosis and bilateral AKA's presents to the ED with her daughters for decreased responsiveness, and trouble breathing. Daughter reports that patient has not been verbal, eating adequately, responding to them in 6 weeks. She thinks the patient had a decrease in alertness 2 weeks ago, and she started having trouble breathing yesterday. They have not noticed a cough. They do not think the patient has had a fever at home. She has not indicated any pain in any way, but she does not communicate. They report that she has been drinking ensures, reluctantly. No further history can be obtained . In the ED Temperature was 100.9, heart rate is 129, respiratory rate 26, oxygen sats are likely not accurate given low measurements at 33%, but a PO2 of 121 on ABG White blood cell count 8.6, hemoglobin 8.6 CHEM panel-sodium 151, potassium 4.9, BUN 46, creatinine 1.57, elevated alk phos at 134, elevated anion gap at 19, albumin is 1.7 Blood culture pending Covid and flu negative Chest x-ray shows right-sided pneumonia CT abdomen pelvis pending Broad spectrum antibiotics and fluid bolus in ED per sepsis protocol  Despite aggressive measurements patient continue to active decline and due to poor quality of life at baseline, goals of care discussions has led to transition to full comfort care and symptomatic management only.  Patient transition to GIP as she is too  fragile and frail to be transported to hospice home.  Will continue symptom controls, end-of-life and full comfort care hospital death anticipated.  Review of Systems: As per HPI otherwise all other systems reviewed and are negative.       Past Medical History:  Diagnosis Date  . Multiple sclerosis ([place]) 2009         Past Surgical History:  Procedure Laterality Date  . AMPUTATION Bilateral 06/03/2020   Procedure: BILATERAL ABOVE KNEE AMPUTATIONS;  Surgeon: Newt Minion, MD;  Location: [facility];  Service: Orthopedics;  Laterality: Bilateral;  . CHOLECYSTECTOMY      Social History  reports that she has never smoked. She has never used smokeless tobacco. She reports that she does not drink alcohol and does not use drugs.  No Known Allergies  Family history Unable to be reviewed secondary to patient current mental status -Ventilator bedside there is history of hypertension and diabetes in her family.         Prior to Admission medications   Medication Sig Start Date End Date Taking? Authorizing Provider  feeding supplement, ENSURE ENLIVE, (ENSURE ENLIVE) LIQD Take 237 mLs by mouth 2 (two) times daily between meals. Patient taking differently: Take 237 mLs by mouth 3 (three) times daily between meals.  06/14/20  Yes Mariel Aloe, MD  acetaminophen (TYLENOL) 325 MG tablet Take 2 tablets (650 mg total) by mouth every 6 (six) hours as needed for mild pain (or Fever >/= 101). 08/06/2020   Barton Dubois, MD  albuterol (PROVENTIL) (2.5 MG/3ML) 0.083% nebulizer solution Take 3 mLs (2.5 mg total) by nebulization every 4 (four) hours as  needed for wheezing or shortness of breath. 08/21/2020   Barton Dubois, MD  antiseptic oral rinse (BIOTENE) LIQD Apply 15 mLs topically as needed for dry mouth. 08/18/2020   Barton Dubois, MD  Chlorhexidine Gluconate Cloth 2 % PADS Apply 6 each topically daily. 08/09/20   Barton Dubois, MD  collagenase (SANTYL) ointment Apply topically daily.  08/09/20   Barton Dubois, MD  docusate sodium (COLACE) 100 MG capsule Take 1 capsule (100 mg total) by mouth 2 (two) times daily. Patient not taking: Reported on 08/05/2020 06/14/20   Mariel Aloe, MD  glycopyrrolate (ROBINUL) 0.2 MG/ML injection Inject 1 mL (0.2 mg total) into the skin every 4 (four) hours as needed (excessive secretions). 09/04/2020   Barton Dubois, MD  haloperidol (HALDOL) 2 MG/ML solution Place 0.3 mLs (0.6 mg total) under the tongue every 4 (four) hours as needed for agitation (or delirium). 08/31/2020   Barton Dubois, MD  LORazepam (ATIVAN) 1 MG tablet Take 1 tablet (1 mg total) by mouth every 4 (four) hours as needed for anxiety. 08/12/2020   Barton Dubois, MD  morphine 2 MG/ML injection Inject 1 mL (2 mg total) into the vein every 3 (three) hours as needed (signs of pain). 08/07/2020   Barton Dubois, MD  ondansetron [facility]) 4 MG/2ML SOLN injection Inject 2 mLs (4 mg total) into the vein every 6 (six) hours as needed for nausea. 09/01/2020   Barton Dubois, MD  pantoprazole (PROTONIX) 40 MG injection Inject 40 mg into the vein daily. 08/09/20   Barton Dubois, MD  polyvinyl alcohol (LIQUIFILM TEARS) 1.4 % ophthalmic solution Place 1 drop into both eyes 4 (four) times daily as needed for dry eyes. 08/15/2020   Barton Dubois, MD    Physical Exam:       Vitals:   08/27/2020 0400 08/15/2020 0500 08/06/2020 0600 08/18/2020 1210  BP:    (!) 59/51  Pulse:      Resp: _0 (!) 22  Temp:      TempSrc:      SpO2:    92%  Weight:      Height:              Vitals:   08/05/2020 0400 09/02/2020 0500 08/06/2020 0600 08/13/2020 1210  BP:    (!) 59/51  Pulse:      Resp: _1 (!) 22  Temp:      TempSrc:      SpO2:    92%  Weight:      Height:       General exam:Afebrile, unresponsive; not following commands, no eating, not drinking,very frail, underweight and chronically ill in appearance.Patient with low BP and  now decrease O2 saturation. Respiratory system:No crackles, no wheezing, positive rhonchi.2-3 O2 supplementation for comfort currently. Cardiovascular system:rate controlled, no rubs, no gallops. Gastrointestinal system:Abdomen is nondistended, soft and nontender. No organomegaly or masses felt. Normal bowel sounds heard. Central nervous system:unable to properly assess secondary to ongoing unresponsiveness Extremities:bilateral AKA. Skin:multiple unstageable pressure injury and open wounds affecting sacrum and ischial area. All wounds present on admission. Psychiatry:unresponsive.   Labs on Admission: I have personally reviewed following labs and imaging studies  CBC: Last Labs       Recent Labs  Lab 08/04/20 2210 08/05/20 0335  WBC 8.6 15.7*  NEUTROABS 7.6 14.2*  HGB 8.6* 7.3*  HCT 30.2* 24.9*  MCV 101.0* 98.8  PLT 82* 73*      Basic Metabolic Panel: Last Labs  Recent Labs  Lab 08/04/20 2210 08/05/20 0335  NA 151* 146*  K 4.9 4.0  CL 113* 113*  CO2 19* 19*  GLUCOSE 109* 127*  BUN 46* 40*  CREATININE 1.57* 1.25*  CALCIUM 8.9 8.0*  MG  --  1.6*  PHOS  --  2.4*      GFR: Estimated Creatinine Clearance: 7.4 mL/min (A) (by C-G formula based on SCr of 1.25 mg/dL (H)).  Liver Function Tests: Last Labs       Recent Labs  Lab 08/04/20 2210 08/05/20 0335  AST 49* 187*  ALT <5 48*  ALKPHOS 134* 104  BILITOT 0.6 0.7  PROT 6.2* 4.7*  ALBUMIN 1.7* 1.3*      Urine analysis: Labs (Brief)          Component Value Date/Time   COLORURINE AMBER (A) 08/05/2020 1137   APPEARANCEUR HAZY (A) 08/05/2020 1137   LABSPEC 1.017 08/05/2020 1137   PHURINE 5.0 08/05/2020 1137   GLUCOSEU NEGATIVE 08/05/2020 1137   HGBUR LARGE (A) 08/05/2020 1137   BILIRUBINUR NEGATIVE 08/05/2020 1137   KETONESUR NEGATIVE 08/05/2020 1137   PROTEINUR 100 (A) 08/05/2020 1137   UROBILINOGEN 0.2 07/19/2011 1232   NITRITE NEGATIVE 08/05/2020 1137    LEUKOCYTESUR SMALL (A) 08/05/2020 1137      Radiological Exams on Admission: Imaging Results (Last 48 hours)  No results found.    Assessment/Plan 1-severe sepsis with septic shock: In the setting of aspiration pneumonia, Streptococcus agalactiae bacteremia and bilateral ischial osteomyelitis -POA -Despite fluid resuscitation, pressor support and broad-spectrum antibiotics patient continue to be critical and her pressure failed to response without the assistance on pressors -After long discussion with family members they understood that any intervention despite how invasive it is will end up being futile to patient's condition and long-term prognosis. -Decision was made to transition to full comfort and symptomatic management only -Antibiotics and pressors discontinued -End-of-life care protocol initiated -Anticipate hospital death; but overall life expectancy is less than 6 weeks.. -Family in agreement and aware that if the patient condition plateau they would like to pursuit residential hospice placement.  2-Toxic andmetabolic encephalopathy/lactic acidosis -In the setting of poor organ perfusion with ongoing septic shock and infection -Plan is for full comfort care. -Patient has remained obtunded despite maximal effort and treatment provided.  3-no unstageable decubitus/sacral pressure ulcers: POA -With CT scan demonstrating osteomyelitis in bilateral ischium -Will continue constant repositioning for comfort purposes -End-of-life care protocol. -Case discussed with surgical surgery who fell no point of debridement at this time; as intervention will be painful unable to be overall futile to patient current condition and prognosis.Marland Kitchen  4-acute kidney injury -In the setting of poor perfusion and hypotension from septic shock -Patient received aggressive fluid resuscitation -At this time no further blood work will be drawn -Will focus on comfort care.  5-hypernatremia -In  the setting of poor oral intake and dehydration -Fluid resuscitation was given -At this time moving tofullcomfort care and symptomatic management only -No further blood work will be drawn.  6-severe protein calorie malnutrition -continueComfort care -Patient albumin 1.7  7-elevated troponin and presentation -Most likely demand ischemia -No acute ischemic changes appreciated on EKG or telemetry. -Willcontinue tofocus on comfort care -MedSurg bed requested.  8-DNR/DNI -Long goal of care discussion with family members at bedside -End-of-life protocol initiated -Extremely poor prognosisand hospital death anticipated -patient accepted for GIP; to frail to be transfer to hospice. -Will focus on comfort care only.  Mildly interval capital  DVT prophylaxis:  None (full comfort only). Code Status:              DNR/DNI Family Communication:       Daughter at bedside. Disposition Plan:              Patient is from:                        Home             Anticipated DC to:                   Hospital death             Anticipated DC date:               To be determined.             Anticipated DC barriers:         No bed availability at residential hospice initially and patient is to fragile for transportation. Consults called:        Hospice Admission status:     Inpatient, length of stay more than 2 midnights; MedSurg bed.  Severity of Illness: Severe severity; admitted with severe sepsis and septic shock.  Despite aggressive measures and pressure support patient failed to improve and after goals of care discussion with family decision made to transition to full comfort care.  At this moment patient has been accepted to hospice will be admitted under GIP for further end-of-life care and symptomatic management.   Barton Dubois MD [facility]  How to contact the [facility] Attending or Consulting provider [place] or covering provider during after hours [place], for this  patient?   1. Check the care team in [facility] and look for a) attending/consulting [facility] provider listed and b) the [facility] team listed 2. Log into [email or web address] and use [place]'s universal password to access. If you do not have the password, please contact the hospital operator. 3. Locate the [facility] provider you are looking for under [facility] and page to a number that you can be directly reached. 4. If you still have difficulty reaching the provider, please page the [facility] (Director on Call) for the Hospitalists listed on amion for assistance.  08/24/2020, 5:57 PM

## 2020-08-09 NOTE — Progress Notes (Signed)
Family members to desk, asking why pt does not have IVF infusing since she is not taking food or fluid by mouth. Advised that pt is comfort care and generally that does not include IVF hydration. Family members visibly upset, stated, "How can y'all just let her die of dehydration and starve her?" Advised these family members that pt's daughter had discussed the decision and plan for care with the physician and that I had not been present for that discussion, but I would gladly contact the MD so that they could ask questions and get clarification on treatment plan and expectations. They were agreeable. MD Gwenlyn Perking contacted and he called the family members in the room to answer questions. When I returned to room, both family members were gone.

## 2020-08-10 LAB — CULTURE, BLOOD (ROUTINE X 2)
Culture: NO GROWTH
Special Requests: ADEQUATE

## 2020-08-10 NOTE — Progress Notes (Signed)
PROGRESS NOTE    Tracy Armstrong  ZHG:[id number] DOB: Aug 29, 1946 DOA: 2020-08-12 PCP: [name], MD   Brief Narrative:  As per H&P written by Dr. Carren Rang on 08/05/20 Tracy Armstrong a74 y.o.female,with history of multiple sclerosis and bilateral AKA's presents to the ED with her daughters for decreased responsiveness, and trouble breathing. Daughter reports that patient has not been verbal, eating adequately, responding to them in 6 weeks. She thinks the patient had a decrease in alertness 2 weeks ago, and she started having trouble breathing yesterday. They have not noticed a cough. They do not think the patient has had a fever at home. She has not indicated any pain in any way, but she does not communicate. They report that she has been drinking ensures, reluctantly. No further history can be obtained  10/6: No acute overnight events. Patient remains on comfort care with plans to transition to inpatient hospice when able.  Assessment & Plan:   Active Problems:   End of life care   1-severe sepsis with septic shock: In the setting of aspiration pneumonia, Streptococcus agalactiae bacteremia and bilateral ischial osteomyelitis -POA -Despite fluid resuscitation, pressor support and broad-spectrum antibiotics patient continue to be critical and her pressure failed to response without the assistance on pressors -After long discussion with family members they understood that any intervention despite how invasive it is will end up being futile to patient's condition and long-term prognosis. -Decision was made to transition to full comfort and symptomatic management only -Antibiotics and pressors discontinued -End-of-life care protocol initiated -Anticipate hospital death; but overall life expectancy is less than 6 weeks.. -Family in agreement and aware that if the patient condition plateau they would like to pursuit residential hospice placement once stable for  transport.  2-metabolic encephalopathy/lactic acidosis -In the setting of poor organ perfusion with ongoing septic shock and infection -Plan is for full comfort care. -Patient has remained obtunded despite maximal effort and treatment provided.  3-no unstageable decubitus/sacral pressure ulcers: POA -With CT scan demonstrating osteomyelitis in bilateral ischium -Will continue constant repositioning for comfort purposes -End-of-life care protocol. -Case discussed with surgical surgery who fell no point of debridement at this time; as intervention will be painful unable to be overall futile to patient current condition and prognosis.Marland Kitchen  4-acute kidney injury -In the setting of poor perfusion and hypotension from septic shock -Patient received aggressive fluid resuscitation -At this time no further blood work will be drawn -Will focus on comfort care.  5-hypernatremia -In the setting of poor oral intake and dehydration -Fluid resuscitation was given -At this time moving to comfort care and symptomatic management only -No further blood work will be drawn.  6-severe protein calorie malnutrition -Comfort care -Patient albumin 1.7  7-elevated troponin and presentation -Most likely demand ischemia -No acute ischemic changes appreciated on EKG or telemetry. -We will focus on comfort care  8-toxic encephalopathy -Patient has remained obtunded -Will focus on comfort care as per goals of care discussion with patient.  9-DNR/DNI -Long goals of care discussion with family members at bedside -End-of-life protocol initiated -Extremely poor prognosis -Will focus on comfort care only.  10-increased upper airway secretions -Started on scopolamine patch. -continue robinul    DVT prophylaxis:None Code Status: DNR/DNI Family Communication: Family at bedside Disposition Plan: Plan to dc to inpatient hospice if stable enough to do so, otherwise in hospital death  expected.  Consultants:   Palliative care  Hospice  General surgery  Procedures:   None  Antimicrobials:   None  Subjective: Patient seen and evaluated today and appears comfortable.  Objective: Vitals:   08/09/20 1300 08/09/20 1830 08/10/20 0730 08/10/20 1458  BP:   111/67 (!) 60/47  Pulse: (!) 102 98 91 91  Resp: 20 20 18 14   Temp:    (!) 96.8 F (36 C)  TempSrc:    Oral  SpO2:    90%    Intake/Output Summary (Last 24 hours) at 08/10/2020 1515 Last data filed at 08/09/2020 1738 Gross per 24 hour  Intake 10 ml  Output --  Net 10 ml   There were no vitals filed for this visit.  Examination:  General exam: Appears calm and comfortable. Minimally responsive. Respiratory system: Clear to auscultation. Respiratory effort normal. On [place]. Cardiovascular system: S1 & S2 heard, RRR.  Gastrointestinal system: Abdomen is nondistended, soft and nontender. Central nervous system: Poorly responsive. Extremities: Bilateral AKA. Skin: No rashes, lesions or ulcers Psychiatry: Cannot be assessed.     Data Reviewed: I have personally reviewed following labs and imaging studies  CBC: Recent Labs  Lab 08/04/20 2210 08/05/20 0335  WBC 8.6 15.7*  NEUTROABS 7.6 14.2*  HGB 8.6* 7.3*  HCT 30.2* 24.9*  MCV 101.0* 98.8  PLT 82* 73*   Basic Metabolic Panel: Recent Labs  Lab 08/04/20 2210 08/05/20 0335  NA 151* 146*  K 4.9 4.0  CL 113* 113*  CO2 19* 19*  GLUCOSE 109* 127*  BUN 46* 40*  CREATININE 1.57* 1.25*  CALCIUM 8.9 8.0*  MG  --  1.6*  PHOS  --  2.4*   GFR: Estimated Creatinine Clearance: 7.4 mL/min (A) (by C-G formula based on SCr of 1.25 mg/dL (H)). Liver Function Tests: Recent Labs  Lab 08/04/20 2210 08/05/20 0335  AST 49* 187*  ALT <5 48*  ALKPHOS 134* 104  BILITOT 0.6 0.7  PROT 6.2* 4.7*  ALBUMIN 1.7* 1.3*   No results for input(s): LIPASE, AMYLASE in the last 168 hours. No results for input(s): AMMONIA in the last 168  hours. Coagulation Profile: Recent Labs  Lab 08/04/20 2210 08/05/20 0335  INR 1.6* 1.7*   Cardiac Enzymes: No results for input(s): CKTOTAL, CKMB, CKMBINDEX, TROPONINI in the last 168 hours. BNP (last 3 results) No results for input(s): PROBNP in the last 8760 hours. HbA1C: No results for input(s): HGBA1C in the last 72 hours. CBG: No results for input(s): GLUCAP in the last 168 hours. Lipid Profile: No results for input(s): CHOL, HDL, LDLCALC, TRIG, CHOLHDL, LDLDIRECT in the last 72 hours. Thyroid Function Tests: No results for input(s): TSH, T4TOTAL, FREET4, T3FREE, THYROIDAB in the last 72 hours. Anemia Panel: No results for input(s): VITAMINB12, FOLATE, FERRITIN, TIBC, IRON, RETICCTPCT in the last 72 hours. Sepsis Labs: Recent Labs  Lab 08/04/20 2210 08/05/20 0335 08/05/20 0552  PROCALCITON  --  12.25  --   LATICACIDVEN >11* 8.5* 5.6*    Recent Results (from the past 240 hour(s))  Blood Culture (routine x 2)     Status: Abnormal   Collection Time: 08/04/20 10:10 PM   Specimen: BLOOD  Result Value Ref Range Status   Specimen Description   Final    BLOOD CENTRAL LINE Performed at [facility], [address], [place], [place]    Special Requests   Final    BOTTLES DRAWN AEROBIC AND ANAEROBIC Blood Culture adequate volume Performed at [facility], [address], [place], [place]    Culture  Setup Time   Final    GRAM POSITIVE COCCI IN  CHAINS IN BOTH AEROBIC AND ANAEROBIC BOTTLES Gram Stain Report Called to,Read Back By and Verified With: FOLEY,B AT 1542 ON 08/05/20 BY HUFFINES,S. CRITICAL RESULT CALLED TO, READ BACK BY AND VERIFIED WITH: B HARRIS RN 2022 08/05/20 A BROWNING GRAM POSITIVE COCCI AEROBIC BOTTLE ONLY RESULT PREV CALLED    Culture (A)  Final    GROUP B STREP(S.AGALACTIAE)ISOLATED STREPTOCOCCUS ANGINOSIS THE SIGNIFICANCE OF ISOLATING THIS ORGANISM FROM A SINGLE SET OF BLOOD CULTURES WHEN MULTIPLE SETS ARE DRAWN IS UNCERTAIN. PLEASE  NOTIFY THE MICROBIOLOGY DEPARTMENT WITHIN ONE WEEK IF SPECIATION AND SENSITIVITIES ARE REQUIRED. Performed at [facility], [address] [place], [place] [address]    Report Status 08/07/2020 FINAL  Final   Organism ID, Bacteria GROUP B STREP(S.AGALACTIAE)ISOLATED  Final      Susceptibility   Group b strep(s.agalactiae)isolated - MIC*    CLINDAMYCIN >=1 RESISTANT Resistant     AMPICILLIN <=0.25 SENSITIVE Sensitive     ERYTHROMYCIN >=8 RESISTANT Resistant     VANCOMYCIN 0.5 SENSITIVE Sensitive     CEFTRIAXONE <=0.12 SENSITIVE Sensitive     LEVOFLOXACIN 1 SENSITIVE Sensitive     PENICILLIN Value in next row Sensitive      SENSITIVE<=0.06    * GROUP B STREP(S.AGALACTIAE)ISOLATED  Blood Culture ID Panel (Reflexed)     Status: Abnormal   Collection Time: 08/04/20 10:10 PM  Result Value Ref Range Status   Enterococcus faecalis NOT DETECTED NOT DETECTED Final   Enterococcus Faecium NOT DETECTED NOT DETECTED Final   Listeria monocytogenes NOT DETECTED NOT DETECTED Final   Staphylococcus species NOT DETECTED NOT DETECTED Final   Staphylococcus aureus (BCID) NOT DETECTED NOT DETECTED Final   Staphylococcus epidermidis NOT DETECTED NOT DETECTED Final   Staphylococcus lugdunensis NOT DETECTED NOT DETECTED Final   Streptococcus species DETECTED (A) NOT DETECTED Final    Comment: CRITICAL RESULT CALLED TO, READ BACK BY AND VERIFIED WITH: B HARRIS RN 2022 08/05/20 A BROWNING    Streptococcus agalactiae DETECTED (A) NOT DETECTED Final    Comment: CRITICAL RESULT CALLED TO, READ BACK BY AND VERIFIED WITH: B HARRIS RN 2022 08/05/20 A BROWNING    Streptococcus pneumoniae NOT DETECTED NOT DETECTED Final   Streptococcus pyogenes NOT DETECTED NOT DETECTED Final   A.calcoaceticus-baumannii NOT DETECTED NOT DETECTED Final   Bacteroides fragilis NOT DETECTED NOT DETECTED Final   Enterobacterales NOT DETECTED NOT DETECTED Final   Enterobacter cloacae complex NOT DETECTED NOT DETECTED Final    Escherichia coli NOT DETECTED NOT DETECTED Final   Klebsiella aerogenes NOT DETECTED NOT DETECTED Final   Klebsiella oxytoca NOT DETECTED NOT DETECTED Final   Klebsiella pneumoniae NOT DETECTED NOT DETECTED Final   Proteus species NOT DETECTED NOT DETECTED Final   Salmonella species NOT DETECTED NOT DETECTED Final   Serratia marcescens NOT DETECTED NOT DETECTED Final   Haemophilus influenzae NOT DETECTED NOT DETECTED Final   Neisseria meningitidis NOT DETECTED NOT DETECTED Final   Pseudomonas aeruginosa NOT DETECTED NOT DETECTED Final   Stenotrophomonas maltophilia NOT DETECTED NOT DETECTED Final   Candida albicans NOT DETECTED NOT DETECTED Final   Candida auris NOT DETECTED NOT DETECTED Final   Candida glabrata NOT DETECTED NOT DETECTED Final   Candida krusei NOT DETECTED NOT DETECTED Final   Candida parapsilosis NOT DETECTED NOT DETECTED Final   Candida tropicalis NOT DETECTED NOT DETECTED Final   Cryptococcus neoformans/gattii NOT DETECTED NOT DETECTED Final    Comment: Performed at [facility], [address] [place], [place] [address]  Respiratory Panel by RT PCR (Flu A&B, Covid) -     Status: None   Collection Time: 08/05/20 12:50 AM  Result Value Ref Range Status   SARS Coronavirus 2 by RT PCR NEGATIVE NEGATIVE Final    Comment: (NOTE) SARS-CoV-2 target nucleic acids are NOT DETECTED.  The SARS-CoV-2 RNA is generally detectable in upper respiratoy specimens during the acute phase of infection. The lowest concentration of SARS-CoV-2 viral copies this assay can detect is 131 copies/mL. A negative result does not preclude SARS-Cov-2 infection and should not be used as the sole basis for treatment or other patient management decisions. A negative result may occur with  improper specimen collection/handling, submission of specimen other than nasopharyngeal swab, presence of viral mutation(s) within the areas targeted by this assay, and inadequate number of viral  copies (<131 copies/mL). A negative result must be combined with clinical observations, patient history, and epidemiological information. The expected result is Negative.  Fact Sheet for Patients:  https://www.moore.com/  Fact Sheet for Healthcare Providers:  https://www.young.biz/  This test is no t yet approved or cleared by the [place] FDA and  has been authorized for detection and/or diagnosis of SARS-CoV-2 by FDA under an Emergency Use Authorization (EUA). This EUA will remain  in effect (meaning this test can be used) for the duration of the COVID-19 declaration under Section 564(b)(1) of the Act, 21 U.S.C. section 360bbb-3(b)(1), unless the authorization is terminated or revoked sooner.     Influenza A by PCR NEGATIVE NEGATIVE Final   Influenza B by PCR NEGATIVE NEGATIVE Final    Comment: (NOTE) The Xpert Xpress SARS-CoV-2/FLU/RSV assay is intended as an aid in  the diagnosis of influenza from Nasopharyngeal swab specimens and  should not be used as a sole basis for treatment. Nasal washings and  aspirates are unacceptable for Xpert Xpress SARS-CoV-2/FLU/RSV  testing.  Fact Sheet for Patients: https://www.moore.com/  Fact Sheet for Healthcare Providers: https://www.young.biz/  This test is not yet approved or cleared by the [place] FDA and  has been authorized for detection and/or diagnosis of SARS-CoV-2 by  FDA under an Emergency Use Authorization (EUA). This EUA will remain  in effect (meaning this test can be used) for the duration of the  Covid-19 declaration under Section 564(b)(1) of the Act, 21  U.S.C. section 360bbb-3(b)(1), unless the authorization is  terminated or revoked. Performed at [facility], [address] [place], [place] [address]   Blood Culture (routine x 2)     Status: None   Collection Time: 08/05/20  3:35 AM   Specimen: BLOOD  Result Value Ref Range  Status   Specimen Description BLOOD PICC LINE  Final   Special Requests   Final    BOTTLES DRAWN AEROBIC AND ANAEROBIC Blood Culture adequate volume   Culture   Final    NO GROWTH 5 DAYS Performed at [facility], [address], [place], [place] [address]    Report Status 08/10/2020 FINAL  Final  Urine culture     Status: Abnormal   Collection Time: 08/05/20 11:37 AM   Specimen: Urine, Catheterized  Result Value Ref Range Status   Specimen Description   Final    URINE, CATHETERIZED Performed at [facility], [address] [place], [place] [address]    Special Requests   Final    NONE Performed at [facility], [address], [place], [place] [address]    Culture 30,000 COLONIES/mL YEAST (A)  Final   Report Status 08/06/2020 FINAL  Final  MRSA PCR Screening     Status: None   Collection Time: 08/05/20  3:30 PM   Specimen: Nasopharyngeal  Result Value Ref Range Status   MRSA by PCR NEGATIVE NEGATIVE Final    Comment:        The GeneXpert MRSA Assay (FDA approved for NASAL specimens only), is one component of a comprehensive MRSA colonization surveillance program. It is not intended to diagnose MRSA infection nor to guide or monitor treatment for MRSA infections. Performed at [facility], [address], [place], [place] [address]          Radiology Studies: No results found.      Scheduled Meds: . Chlorhexidine Gluconate Cloth  6 each Topical Daily  . collagenase   Topical Daily  . feeding supplement (ENSURE ENLIVE)  237 mL Oral BID BM  . pantoprazole (PROTONIX) IV  40 mg Intravenous Q24H  . scopolamine  1 patch Transdermal Q72H  . sodium chloride flush  3 mL Intravenous Q12H   Continuous Infusions: . sodium chloride       LOS: 2 days    Time spent: 30 minutes    Evin Loiseau Hoover Brunette, DO [facility]  If 7PM-7AM, please contact night-coverage [email or web address] 08/10/2020, 3:15 PM

## 2020-08-21 DIAGNOSIS — G8222 Paraplegia, incomplete: Secondary | ICD-10-CM | POA: Diagnosis not present

## 2020-08-21 DIAGNOSIS — Z89511 Acquired absence of right leg below knee: Secondary | ICD-10-CM | POA: Diagnosis not present

## 2020-08-21 DIAGNOSIS — Z89512 Acquired absence of left leg below knee: Secondary | ICD-10-CM | POA: Diagnosis not present

## 2020-09-05 NOTE — Death Summary Note (Signed)
Physician Discharge Summary  Tracy Armstrong ZOX:[id number] DOB: May 12, 1946 DOA: 2020/08/09  PCP: Tracy Deiters, MD  Admit date: 2020-08-09  Death date: 08-12-2020 0752  Admitted From:Home  Disposition:  Expired  Brief/Interim Summary: As per H&P written by Dr. Carren Armstrong on 08/05/20 Tracy Armstrong a74 y.o.female,with history of multiple sclerosis and bilateral AKA's presents to the ED with her daughters for decreased responsiveness, and trouble breathing. Daughter reports that patient has not been verbal, eating adequately, responding to them in 6 weeks. She thinks the patient had a decrease in alertness 2 weeks ago, and she started having trouble breathing yesterday. They have not noticed a cough. They do not think the patient has had a fever at home. She has not indicated any pain in any way, but she does not communicate. They report that she has been drinking ensures, reluctantly. No further history can be obtained.  -Patient was admitted with severe sepsis/septic shock in the setting of aspiration pneumonia with strep agalactiae bacteremia and bilateral ischial osteomyelitis which was present on admission.  She was also noted to have associated metabolic encephalopathy with lactic acidosis as well as acute kidney injury.  Despite fluid resuscitation and pressor support as well as broad-spectrum antibiotics patient continued to have clinical decline.  She was ultimately made to be comfort care after long discussion with family members and palliative care involvement.  Antibiotics and pressors were discontinued by 08/10/23.  She remains on comfort care with no significant issues during the course of this hospitalization.  She was overall too unstable for transfer to a hospice facility and therefore, she was managed on comfort care protocol in the hospital.  She ultimately expired on 08-12-20 at 0752.  Discharge Diagnoses:  Active Problems:   End of life care    No Known  Allergies  Consultations:  Palliative care  Hospice  General surgery   Procedures/Studies: CT ABDOMEN PELVIS WO CONTRAST  Result Date: 08/05/2020 CLINICAL DATA:  Abdominal pain.  Decreased responsiveness. EXAM: CT ABDOMEN AND PELVIS WITHOUT CONTRAST TECHNIQUE: Multidetector CT imaging of the abdomen and pelvis was performed following the standard protocol without IV contrast. COMPARISON:  07/19/2011 FINDINGS: LOWER CHEST: There are bilateral basilar endobronchial opacities, right worse than left. HEPATOBILIARY: No focal abnormality of the liver. Status post cholecystectomy. PANCREAS: Normal pancreas. No ductal dilatation or peripancreatic fluid collection. SPLEEN: Normal. ADRENALS/URINARY TRACT: The adrenal glands are normal. No hydronephrosis, nephroureterolithiasis or solid renal mass. The urinary bladder is normal for degree of distention STOMACH/BOWEL: There is no hiatal hernia. Normal duodenal course and caliber. No small bowel dilatation or inflammation. There is fistulous connection between the rectum and the dorsal skin surface. There is stool extending through this connection. Normal appendix. VASCULAR/LYMPHATIC: Normal course and caliber of the major abdominal vessels. No abdominal or pelvic lymphadenopathy. REPRODUCTIVE: Normal uterus. No adnexal mass. MUSCULOSKELETAL. There is a right ischial decubitus ulcer with osteolysis of the ischial tuberosity. The left ischial tuberosity is also likely exposed the external environment. OTHER: None. IMPRESSION: 1. Bilateral ischial decubitus ulcers with associated underlying osteomyelitis. 2. 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 3. Bilateral basilar endobronchial opacities, right worse than left, concerning for aspiration pneumonia. Electronically Signed   By: Tracy Armstrong M.D.   On: 08/05/2020 02:39   DG Chest Port 1 View  Result Date: 08/04/2020 CLINICAL DATA:  Code sepsis  altered low blood pressure EXAM: PORTABLE CHEST 1 VIEW COMPARISON:  05/31/2020, 05/13/2020 FINDINGS: Left lung  is grossly clear. Patchy vague airspace opacities in the right mid lung and lung base. No pleural effusion. Normal heart size. No pneumothorax. IMPRESSION: Patchy vague airspace opacities in the right mid lung and lung base, suspicious for pneumonia. Electronically Signed   By: Tracy Armstrong M.D.   On: 08/04/2020 21:41   DG Chest Port 1V same Day  Result Date: 08/04/2020 CLINICAL DATA:  Central line placement. History of multiple sclerosis. EXAM: PORTABLE CHEST 1 VIEW COMPARISON:  08/04/2020 FINDINGS: Heart size and pulmonary vascularity are normal. Right central venous catheter with tip over the cavoatrial junction. Peribronchial thickening with patchy perihilar opacities on the right. This may represent bronchitis or pneumonia. No significant change since previous study, allowing for differences in technique. No pleural effusions. No pneumothorax. Mediastinal contours are normal. Degenerative changes in the spine and shoulders. Surgical clips in the right upper quadrant. IMPRESSION: 1. Right central venous catheter with tip over the cavoatrial junction. No pneumothorax. 2. Peribronchial thickening with patchy perihilar opacities on the right suggesting bronchitis or pneumonia. Electronically Signed   By: Tracy Armstrong M.D.   On: 08/04/2020 22:40     Discharge Exam: Vitals:   08/10/20 1458 08/19/2020 0432  BP: (!) 60/47 (!) 51/26  Pulse: 91 98  Resp: 14 20  Temp: (!) 96.8 F (36 C)   SpO2: 90%    Vitals:   08/09/20 1830 08/10/20 0730 08/10/20 1458 09/01/2020 0432  BP:  111/67 (!) 60/47 (!) 51/26  Pulse: 98 91 91 98  Resp: 20 18 14 20   Temp:   (!) 96.8 F (36 C)   TempSrc:   Oral   SpO2:   90%     The results of significant diagnostics from this hospitalization (including imaging, microbiology, ancillary and laboratory) are listed below for reference.     Microbiology: Recent  Results (from the past 240 hour(s))  Blood Culture (routine x 2)     Status: Abnormal   Collection Time: 08/04/20 10:10 PM   Specimen: BLOOD  Result Value Ref Range Status   Specimen Description   Final    BLOOD CENTRAL LINE Performed at [facility], [address], [place], [place]    Special Requests   Final    BOTTLES DRAWN AEROBIC AND ANAEROBIC Blood Culture adequate volume Performed at [facility], [address], [place], [place]    Culture  Setup Time   Final    GRAM POSITIVE COCCI IN CHAINS IN BOTH AEROBIC AND ANAEROBIC BOTTLES Gram Stain Report Called to,Read Back By and Verified With: FOLEY,B AT 1542 ON 08/05/20 BY HUFFINES,S. CRITICAL RESULT CALLED TO, READ BACK BY AND VERIFIED WITH: B HARRIS RN 2022 08/05/20 A BROWNING GRAM POSITIVE COCCI AEROBIC BOTTLE ONLY RESULT PREV CALLED    Culture (A)  Final    GROUP B STREP(S.AGALACTIAE)ISOLATED STREPTOCOCCUS ANGINOSIS THE SIGNIFICANCE OF ISOLATING THIS ORGANISM FROM A SINGLE SET OF BLOOD CULTURES WHEN MULTIPLE SETS ARE DRAWN IS UNCERTAIN. PLEASE NOTIFY THE MICROBIOLOGY DEPARTMENT WITHIN ONE WEEK IF SPECIATION AND SENSITIVITIES ARE REQUIRED. Performed at [facility], [address], [place], [place]    Report Status 08/07/2020 FINAL  Final   Organism ID, Bacteria GROUP B STREP(S.AGALACTIAE)ISOLATED  Final      Susceptibility   Group b strep(s.agalactiae)isolated - MIC*    CLINDAMYCIN >=1 RESISTANT Resistant     AMPICILLIN <=0.25 SENSITIVE Sensitive     ERYTHROMYCIN >=8 RESISTANT Resistant     VANCOMYCIN 0.5 SENSITIVE Sensitive  CEFTRIAXONE <=0.12 SENSITIVE Sensitive     LEVOFLOXACIN 1 SENSITIVE Sensitive     PENICILLIN Value in next row Sensitive      SENSITIVE<=0.06    * GROUP B STREP(S.AGALACTIAE)ISOLATED  Blood Culture ID Panel (Reflexed)     Status: Abnormal   Collection Time: 08/04/20 10:10 PM  Result Value Ref Range Status   Enterococcus faecalis NOT DETECTED NOT DETECTED Final    Enterococcus Faecium NOT DETECTED NOT DETECTED Final   Listeria monocytogenes NOT DETECTED NOT DETECTED Final   Staphylococcus species NOT DETECTED NOT DETECTED Final   Staphylococcus aureus (BCID) NOT DETECTED NOT DETECTED Final   Staphylococcus epidermidis NOT DETECTED NOT DETECTED Final   Staphylococcus lugdunensis NOT DETECTED NOT DETECTED Final   Streptococcus species DETECTED (A) NOT DETECTED Final    Comment: CRITICAL RESULT CALLED TO, READ BACK BY AND VERIFIED WITH: B HARRIS RN 2022 08/05/20 A BROWNING    Streptococcus agalactiae DETECTED (A) NOT DETECTED Final    Comment: CRITICAL RESULT CALLED TO, READ BACK BY AND VERIFIED WITH: B HARRIS RN 2022 08/05/20 A BROWNING    Streptococcus pneumoniae NOT DETECTED NOT DETECTED Final   Streptococcus pyogenes NOT DETECTED NOT DETECTED Final   A.calcoaceticus-baumannii NOT DETECTED NOT DETECTED Final   Bacteroides fragilis NOT DETECTED NOT DETECTED Final   Enterobacterales NOT DETECTED NOT DETECTED Final   Enterobacter cloacae complex NOT DETECTED NOT DETECTED Final   Escherichia coli NOT DETECTED NOT DETECTED Final   Klebsiella aerogenes NOT DETECTED NOT DETECTED Final   Klebsiella oxytoca NOT DETECTED NOT DETECTED Final   Klebsiella pneumoniae NOT DETECTED NOT DETECTED Final   Proteus species NOT DETECTED NOT DETECTED Final   Salmonella species NOT DETECTED NOT DETECTED Final   Serratia marcescens NOT DETECTED NOT DETECTED Final   Haemophilus influenzae NOT DETECTED NOT DETECTED Final   Neisseria meningitidis NOT DETECTED NOT DETECTED Final   Pseudomonas aeruginosa NOT DETECTED NOT DETECTED Final   Stenotrophomonas maltophilia NOT DETECTED NOT DETECTED Final   Candida albicans NOT DETECTED NOT DETECTED Final   Candida auris NOT DETECTED NOT DETECTED Final   Candida glabrata NOT DETECTED NOT DETECTED Final   Candida krusei NOT DETECTED NOT DETECTED Final   Candida parapsilosis NOT DETECTED NOT DETECTED Final   Candida tropicalis  NOT DETECTED NOT DETECTED Final   Cryptococcus neoformans/gattii NOT DETECTED NOT DETECTED Final    Comment: Performed at [facility], [address], [place], [place] [address]  Respiratory Panel by RT PCR (Flu A&B, Covid) -     Status: None   Collection Time: 08/05/20 12:50 AM  Result Value Ref Range Status   SARS Coronavirus 2 by RT PCR NEGATIVE NEGATIVE Final    Comment: (NOTE) SARS-CoV-2 target nucleic acids are NOT DETECTED.  The SARS-CoV-2 RNA is generally detectable in upper respiratoy specimens during the acute phase of infection. The lowest concentration of SARS-CoV-2 viral copies this assay can detect is 131 copies/mL. A negative result does not preclude SARS-Cov-2 infection and should not be used as the sole basis for treatment or other patient management decisions. A negative result may occur with  improper specimen collection/handling, submission of specimen other than nasopharyngeal swab, presence of viral mutation(s) within the areas targeted by this assay, and inadequate number of viral copies (<131 copies/mL). A negative result must be combined with clinical observations, patient history, and epidemiological information. The expected result is Negative.  Fact Sheet for Patients:  https://www.moore.com/  Fact Sheet for Healthcare Providers:  https://www.young.biz/  This test  is no t yet approved or cleared by the [place] and  has been authorized for detection and/or diagnosis of SARS-CoV-2 by FDA under an Emergency Use Authorization (EUA). This EUA will remain  in effect (meaning this test can be used) for the duration of the COVID-19 declaration under Section 564(b)(1) of the Act, 21 U.S.C. section 360bbb-3(b)(1), unless the authorization is terminated or revoked sooner.     Influenza A by PCR NEGATIVE NEGATIVE Final   Influenza B by PCR NEGATIVE NEGATIVE Final    Comment: (NOTE) The Xpert Xpress  SARS-CoV-2/FLU/RSV assay is intended as an aid in  the diagnosis of influenza from Nasopharyngeal swab specimens and  should not be used as a sole basis for treatment. Nasal washings and  aspirates are unacceptable for Xpert Xpress SARS-CoV-2/FLU/RSV  testing.  Fact Sheet for Patients: https://www.moore.com/  Fact Sheet for Healthcare Providers: https://www.young.biz/  This test is not yet approved or cleared by the [place] FDA and  has been authorized for detection and/or diagnosis of SARS-CoV-2 by  FDA under an Emergency Use Authorization (EUA). This EUA will remain  in effect (meaning this test can be used) for the duration of the  Covid-19 declaration under Section 564(b)(1) of the Act, 21  U.S.C. section 360bbb-3(b)(1), unless the authorization is  terminated or revoked. Performed at [facility], [address], [place], [place] [address]   Blood Culture (routine x 2)     Status: None   Collection Time: 08/05/20  3:35 AM   Specimen: BLOOD  Result Value Ref Range Status   Specimen Description BLOOD PICC LINE  Final   Special Requests   Final    BOTTLES DRAWN AEROBIC AND ANAEROBIC Blood Culture adequate volume   Culture   Final    NO GROWTH 5 DAYS Performed at [facility], [address] [place], [place] [address]    Report Status 08/10/2020 FINAL  Final  Urine culture     Status: Abnormal   Collection Time: 08/05/20 11:37 AM   Specimen: Urine, Catheterized  Result Value Ref Range Status   Specimen Description   Final    URINE, CATHETERIZED Performed at [facility], [address], [place], [place] [address]    Special Requests   Final    NONE Performed at [facility], [address], [place], [place] [address]    Culture 30,000 COLONIES/mL YEAST (A)  Final   Report Status 08/06/2020 FINAL  Final  MRSA PCR Screening     Status: None   Collection Time: 08/05/20  3:30 PM   Specimen: Nasopharyngeal  Result Value Ref Range  Status   MRSA by PCR NEGATIVE NEGATIVE Final    Comment:        The GeneXpert MRSA Assay (FDA approved for NASAL specimens only), is one component of a comprehensive MRSA colonization surveillance program. It is not intended to diagnose MRSA infection nor to guide or monitor treatment for MRSA infections. Performed at [facility], [address], [place], [place] [address]      Labs: BNP (last 3 results) Recent Labs    05/15/20 0750 05/16/20 0659 05/17/20 0435  BNP 149.8* 225.9* 99.0   Basic Metabolic Panel: Recent Labs  Lab 08/04/20 2210 08/05/20 0335  NA 151* 146*  K 4.9 4.0  CL 113* 113*  CO2 19* 19*  GLUCOSE 109* 127*  BUN 46* 40*  CREATININE 1.57* 1.25*  CALCIUM 8.9 8.0*  MG  --  1.6*  PHOS  --  2.4*  Liver Function Tests: Recent Labs  Lab 08/04/20 2210 08/05/20 0335  AST 49* 187*  ALT <5 48*  ALKPHOS 134* 104  BILITOT 0.6 0.7  PROT 6.2* 4.7*  ALBUMIN 1.7* 1.3*   No results for input(s): LIPASE, AMYLASE in the last 168 hours. No results for input(s): AMMONIA in the last 168 hours. CBC: Recent Labs  Lab 08/04/20 2210 08/05/20 0335  WBC 8.6 15.7*  NEUTROABS 7.6 14.2*  HGB 8.6* 7.3*  HCT 30.2* 24.9*  MCV 101.0* 98.8  PLT 82* 73*   Cardiac Enzymes: No results for input(s): CKTOTAL, CKMB, CKMBINDEX, TROPONINI in the last 168 hours. BNP: Invalid input(s): POCBNP CBG: No results for input(s): GLUCAP in the last 168 hours. D-Dimer No results for input(s): DDIMER in the last 72 hours. Hgb A1c No results for input(s): HGBA1C in the last 72 hours. Lipid Profile No results for input(s): CHOL, HDL, LDLCALC, TRIG, CHOLHDL, LDLDIRECT in the last 72 hours. Thyroid function studies No results for input(s): TSH, T4TOTAL, T3FREE, THYROIDAB in the last 72 hours.  Invalid input(s): FREET3 Anemia work up No results for input(s): VITAMINB12, FOLATE, FERRITIN, TIBC, IRON, RETICCTPCT in the last 72 hours. Urinalysis    Component Value Date/Time    COLORURINE AMBER (A) 08/05/2020 1137   APPEARANCEUR HAZY (A) 08/05/2020 1137   LABSPEC 1.017 08/05/2020 1137   PHURINE 5.0 08/05/2020 1137   GLUCOSEU NEGATIVE 08/05/2020 1137   HGBUR LARGE (A) 08/05/2020 1137   BILIRUBINUR NEGATIVE 08/05/2020 1137   KETONESUR NEGATIVE 08/05/2020 1137   PROTEINUR 100 (A) 08/05/2020 1137   UROBILINOGEN 0.2 07/19/2011 1232   NITRITE NEGATIVE 08/05/2020 1137   LEUKOCYTESUR SMALL (A) 08/05/2020 1137   Sepsis Labs Invalid input(s): PROCALCITONIN,  WBC,  LACTICIDVEN Microbiology Recent Results (from the past 240 hour(s))  Blood Culture (routine x 2)     Status: Abnormal   Collection Time: 08/04/20 10:10 PM   Specimen: BLOOD  Result Value Ref Range Status   Specimen Description   Final    BLOOD CENTRAL LINE Performed at [facility], [address] [place], [place] [address]    Special Requests   Final    BOTTLES DRAWN AEROBIC AND ANAEROBIC Blood Culture adequate volume Performed at [facility], [address], [place], [place] [address]    Culture  Setup Time   Final    GRAM POSITIVE COCCI IN CHAINS IN BOTH AEROBIC AND ANAEROBIC BOTTLES Gram Stain Report Called to,Read Back By and Verified With: FOLEY,B AT 1542 ON 08/05/20 BY HUFFINES,S. CRITICAL RESULT CALLED TO, READ BACK BY AND VERIFIED WITH: B HARRIS RN 2022 08/05/20 A BROWNING GRAM POSITIVE COCCI AEROBIC BOTTLE ONLY RESULT PREV CALLED    Culture (A)  Final    GROUP B STREP(S.AGALACTIAE)ISOLATED STREPTOCOCCUS ANGINOSIS THE SIGNIFICANCE OF ISOLATING THIS ORGANISM FROM A SINGLE SET OF BLOOD CULTURES WHEN MULTIPLE SETS ARE DRAWN IS UNCERTAIN. PLEASE NOTIFY THE MICROBIOLOGY DEPARTMENT WITHIN ONE WEEK IF SPECIATION AND SENSITIVITIES ARE REQUIRED. Performed at [facility], [address] [place], [place] [address]    Report Status 08/07/2020 FINAL  Final   Organism ID, Bacteria GROUP B STREP(S.AGALACTIAE)ISOLATED  Final      Susceptibility   Group b strep(s.agalactiae)isolated - MIC*     CLINDAMYCIN >=1 RESISTANT Resistant     AMPICILLIN <=0.25 SENSITIVE Sensitive     ERYTHROMYCIN >=8 RESISTANT Resistant     VANCOMYCIN 0.5 SENSITIVE Sensitive     CEFTRIAXONE <=0.12 SENSITIVE Sensitive     LEVOFLOXACIN 1 SENSITIVE Sensitive  PENICILLIN Value in next row Sensitive      SENSITIVE<=0.06    * GROUP B STREP(S.AGALACTIAE)ISOLATED  Blood Culture ID Panel (Reflexed)     Status: Abnormal   Collection Time: 08/04/20 10:10 PM  Result Value Ref Range Status   Enterococcus faecalis NOT DETECTED NOT DETECTED Final   Enterococcus Faecium NOT DETECTED NOT DETECTED Final   Listeria monocytogenes NOT DETECTED NOT DETECTED Final   Staphylococcus species NOT DETECTED NOT DETECTED Final   Staphylococcus aureus (BCID) NOT DETECTED NOT DETECTED Final   Staphylococcus epidermidis NOT DETECTED NOT DETECTED Final   Staphylococcus lugdunensis NOT DETECTED NOT DETECTED Final   Streptococcus species DETECTED (A) NOT DETECTED Final    Comment: CRITICAL RESULT CALLED TO, READ BACK BY AND VERIFIED WITH: B HARRIS RN 2022 08/05/20 A BROWNING    Streptococcus agalactiae DETECTED (A) NOT DETECTED Final    Comment: CRITICAL RESULT CALLED TO, READ BACK BY AND VERIFIED WITH: B HARRIS RN 2022 08/05/20 A BROWNING    Streptococcus pneumoniae NOT DETECTED NOT DETECTED Final   Streptococcus pyogenes NOT DETECTED NOT DETECTED Final   A.calcoaceticus-baumannii NOT DETECTED NOT DETECTED Final   Bacteroides fragilis NOT DETECTED NOT DETECTED Final   Enterobacterales NOT DETECTED NOT DETECTED Final   Enterobacter cloacae complex NOT DETECTED NOT DETECTED Final   Escherichia coli NOT DETECTED NOT DETECTED Final   Klebsiella aerogenes NOT DETECTED NOT DETECTED Final   Klebsiella oxytoca NOT DETECTED NOT DETECTED Final   Klebsiella pneumoniae NOT DETECTED NOT DETECTED Final   Proteus species NOT DETECTED NOT DETECTED Final   Salmonella species NOT DETECTED NOT DETECTED Final   Serratia marcescens NOT DETECTED  NOT DETECTED Final   Haemophilus influenzae NOT DETECTED NOT DETECTED Final   Neisseria meningitidis NOT DETECTED NOT DETECTED Final   Pseudomonas aeruginosa NOT DETECTED NOT DETECTED Final   Stenotrophomonas maltophilia NOT DETECTED NOT DETECTED Final   Candida albicans NOT DETECTED NOT DETECTED Final   Candida auris NOT DETECTED NOT DETECTED Final   Candida glabrata NOT DETECTED NOT DETECTED Final   Candida krusei NOT DETECTED NOT DETECTED Final   Candida parapsilosis NOT DETECTED NOT DETECTED Final   Candida tropicalis NOT DETECTED NOT DETECTED Final   Cryptococcus neoformans/gattii NOT DETECTED NOT DETECTED Final    Comment: Performed at [facility], [address], [place], [place] [address]  Respiratory Panel by RT PCR (Flu A&B, Covid) -     Status: None   Collection Time: 08/05/20 12:50 AM  Result Value Ref Range Status   SARS Coronavirus 2 by RT PCR NEGATIVE NEGATIVE Final    Comment: (NOTE) SARS-CoV-2 target nucleic acids are NOT DETECTED.  The SARS-CoV-2 RNA is generally detectable in upper respiratoy specimens during the acute phase of infection. The lowest concentration of SARS-CoV-2 viral copies this assay can detect is 131 copies/mL. A negative result does not preclude SARS-Cov-2 infection and should not be used as the sole basis for treatment or other patient management decisions. A negative result may occur with  improper specimen collection/handling, submission of specimen other than nasopharyngeal swab, presence of viral mutation(s) within the areas targeted by this assay, and inadequate number of viral copies (<131 copies/mL). A negative result must be combined with clinical observations, patient history, and epidemiological information. The expected result is Negative.  Fact Sheet for Patients:  https://www.moore.com/  Fact Sheet for Healthcare Providers:  https://www.young.biz/  This test is no t yet approved or  cleared by the [place] FDA and  has been  authorized for detection and/or diagnosis of SARS-CoV-2 by FDA under an Emergency Use Authorization (EUA). This EUA will remain  in effect (meaning this test can be used) for the duration of the COVID-19 declaration under Section 564(b)(1) of the Act, 21 U.S.C. section 360bbb-3(b)(1), unless the authorization is terminated or revoked sooner.     Influenza A by PCR NEGATIVE NEGATIVE Final   Influenza B by PCR NEGATIVE NEGATIVE Final    Comment: (NOTE) The Xpert Xpress SARS-CoV-2/FLU/RSV assay is intended as an aid in  the diagnosis of influenza from Nasopharyngeal swab specimens and  should not be used as a sole basis for treatment. Nasal washings and  aspirates are unacceptable for Xpert Xpress SARS-CoV-2/FLU/RSV  testing.  Fact Sheet for Patients: https://www.moore.com/  Fact Sheet for Healthcare Providers: https://www.young.biz/  This test is not yet approved or cleared by the [place] FDA and  has been authorized for detection and/or diagnosis of SARS-CoV-2 by  FDA under an Emergency Use Authorization (EUA). This EUA will remain  in effect (meaning this test can be used) for the duration of the  Covid-19 declaration under Section 564(b)(1) of the Act, 21  U.S.C. section 360bbb-3(b)(1), unless the authorization is  terminated or revoked. Performed at [facility], [address] [place], [place] [address]   Blood Culture (routine x 2)     Status: None   Collection Time: 08/05/20  3:35 AM   Specimen: BLOOD  Result Value Ref Range Status   Specimen Description BLOOD PICC LINE  Final   Special Requests   Final    BOTTLES DRAWN AEROBIC AND ANAEROBIC Blood Culture adequate volume   Culture   Final    NO GROWTH 5 DAYS Performed at [facility], [address] [place], [place] [address]    Report Status 08/10/2020 FINAL  Final  Urine culture     Status: Abnormal   Collection Time:  08/05/20 11:37 AM   Specimen: Urine, Catheterized  Result Value Ref Range Status   Specimen Description   Final    URINE, CATHETERIZED Performed at [facility], [address], [place], [place] [address]    Special Requests   Final    NONE Performed at [facility], [address] [place], [place] [address]    Culture 30,000 COLONIES/mL YEAST (A)  Final   Report Status 08/06/2020 FINAL  Final  MRSA PCR Screening     Status: None   Collection Time: 08/05/20  3:30 PM   Specimen: Nasopharyngeal  Result Value Ref Range Status   MRSA by PCR NEGATIVE NEGATIVE Final    Comment:        The GeneXpert MRSA Assay (FDA approved for NASAL specimens only), is one component of a comprehensive MRSA colonization surveillance program. It is not intended to diagnose MRSA infection nor to guide or monitor treatment for MRSA infections. Performed at [facility], [address], [place], [place] [address]      Time coordinating discharge: 35 minutes  SIGNED:   Erick Blinks, DO [facility] 08/05/2020, 8:50 AM  If 7PM-7AM, please contact night-coverage [email or web address]

## 2020-09-05 NOTE — Consult Note (Signed)
Palliative Consult:  HPI: 74 yo female with past medical history of multiple sclerosis, bilateral AKAs admitted 08/05/20 with difficulty breathing and found to have overall decline in alertness, intake, responsiveness related to aspiration pneumonia as well as bacteremia and bilateral ischial osteomyelitis. Over the next few days she continued without improvement and family elected comfort care with hospice.   I met today at Tracy Armstrong's bedside at request of nursing with concern for need for further family education regarding comfort care and end of life. I spoke with daughter, Tracy Armstrong, and other family members at bedside. They report that they had concerns last night about intake and not receiving feeding or fluids but that it was explained to them that this can cause fluid build up. I explained further that as the body shuts down the body cannot tolerate these measures and can lead to swelling and fluid in lungs effecting breathing and once fluid is there we cannot get rid of it. I also explained that patients at end of life do not feel hunger and thirst as we do and even when awake and alert only desire a sip of water or ice chip to moisten their mouth so she is not suffering from no intake at this time. They express understanding.   Tracy Armstrong appears very comfortable and unresponsive. Breathing is shallow but regular. She is incredibly thin and frail. I did explain that family may notice changes in breathing and explained apnea and Cheynes-Stokes breathing and that this is nothing to be alarmed about but a sign that end of life is progressing. They report that she is very comfortable and no further concerns. Emotional support provided. Discussed with Tracy Sarna RN and Dr. Manuella Ghazi.  Unresponsive. Comfortable. No apnea. If no changes in status tomorrow hospice may consider transfer to facility if bed available.   Plan: - Continue comfort care and hospice support.   [id number] 30 min  Vinie Sill, NP Palliative  Medicine Team Pager [phone] (Please see [email or web address] for schedule) Team Phone [phone]    Greater than 50%  of this time was spent counseling and coordinating care related to the above assessment and plan

## 2020-09-05 DEATH — deceased

## 2020-09-26 IMAGING — MR MR ANKLE*L* WO/W CM
4 of 8 series · 19 of 40 positions shown · IV contrast (gadavist)
Comparison: X-ray 05/13/2020

CLINICAL DATA: Nonhealing pressure all ulcers of the left foot and
ankle

EXAM:
MRI OF THE LEFT FOREFOOT WITHOUT AND WITH CONTRAST; MRI OF THE LEFT
ANKLE WITHOUT AND WITH CONTRAST
TECHNIQUE: Multiplanar, multisequence MR imaging of the left foot and ankle was
performed both before and after administration of intravenous
contrast.
CONTRAST:  6mL GADAVIST GADOBUTROL 1 MMOL/ML IV SOLN

[Series 2: T1 · sagittal · 4.0mm · 0.70mm/px · 3 of 20 slices shown (1 of 2)]
[im 1/20]
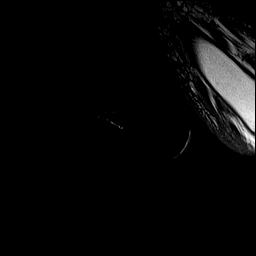
[im 10/20]
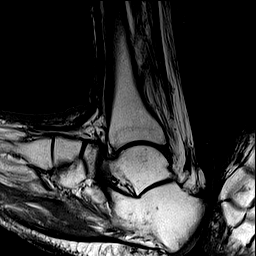
[im 20/20]
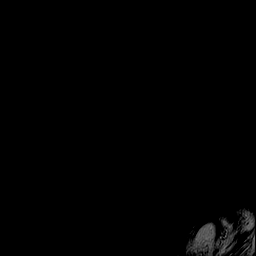

[Series 3: T2 fat-sat · coronal · 4.0mm · 0.34mm/px · 6 of 30 slices shown]
[im 1/30]
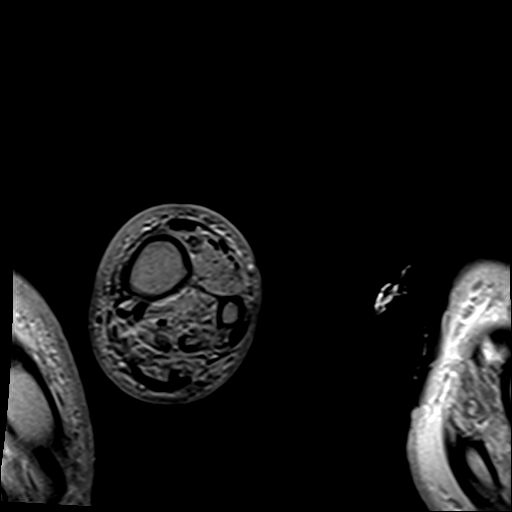
[im 6/30]
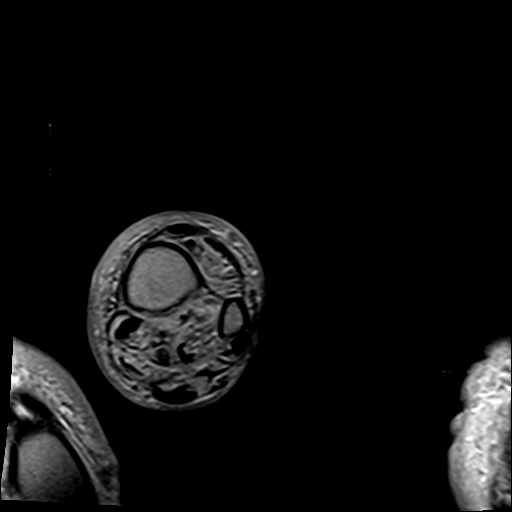
[im 12/30]
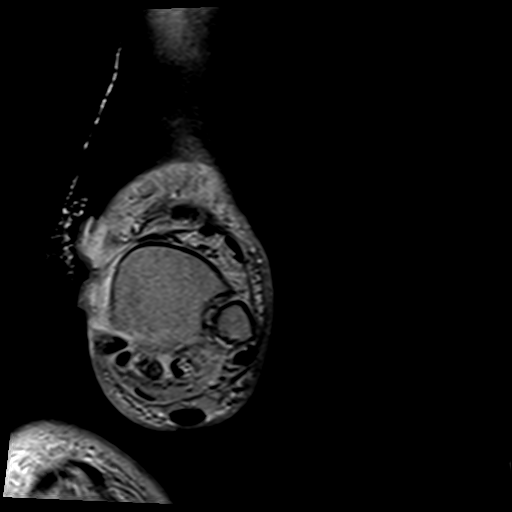
[im 18/30]
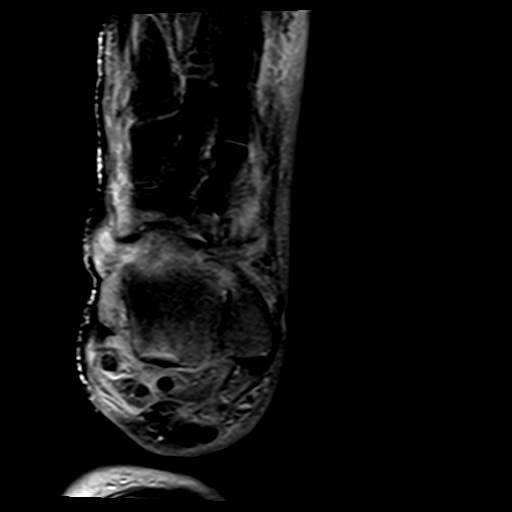
[im 24/30]
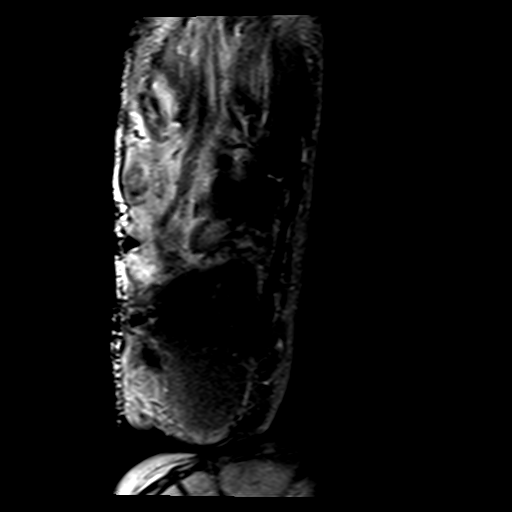
[im 30/30]
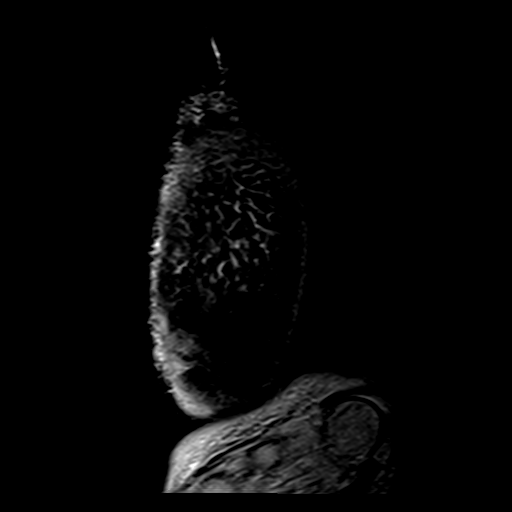

[Series 7: T1 · coronal · non-contrast · 4.0mm · 0.34mm/px · 6 of 30 slices shown (2 of 2)]
[im 1/30]
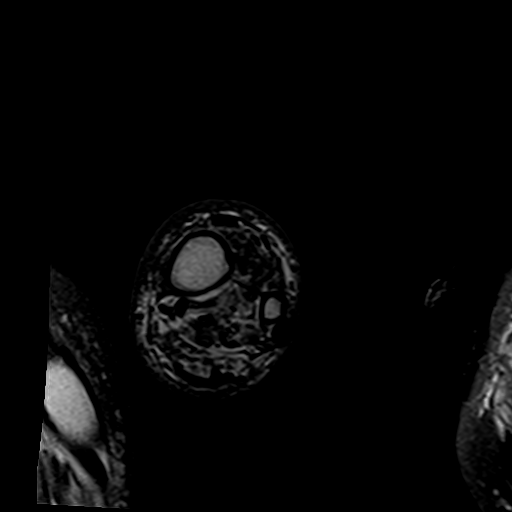
[im 6/30]
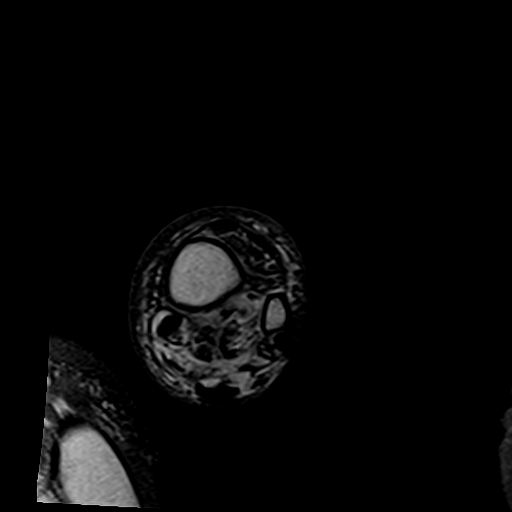
[im 12/30]
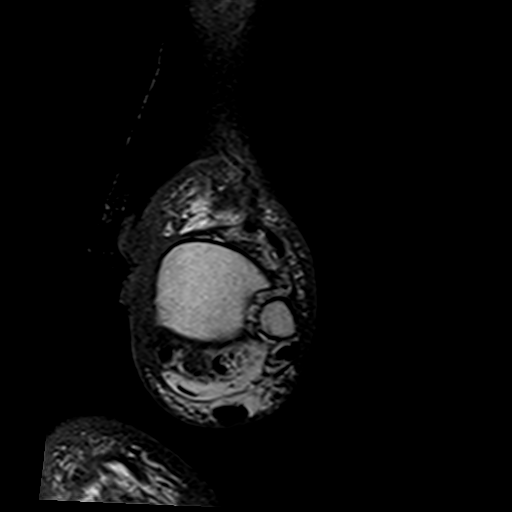
[im 18/30]
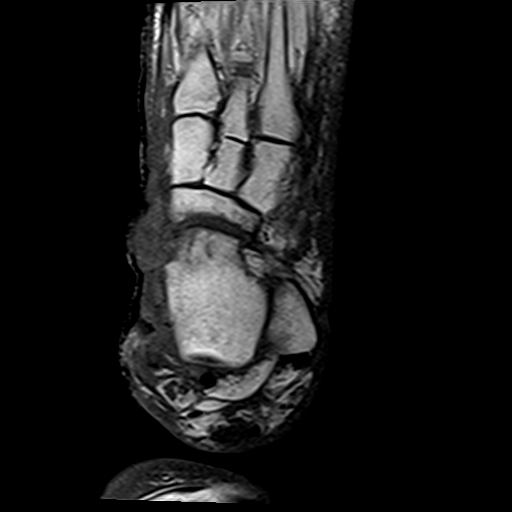
[im 24/30]
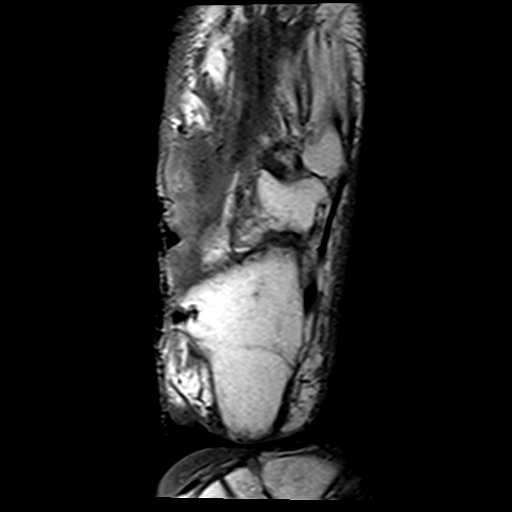
[im 30/30]
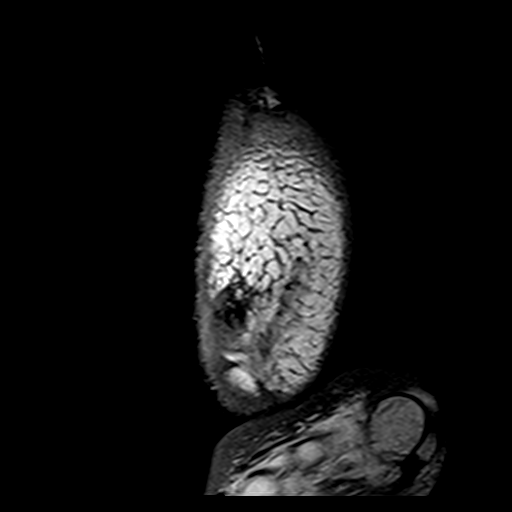

[Series 8: T1 fat-sat post-contrast · coronal · 4.0mm · 0.34mm/px · 4 of 30 slices shown]
[im 1/30]
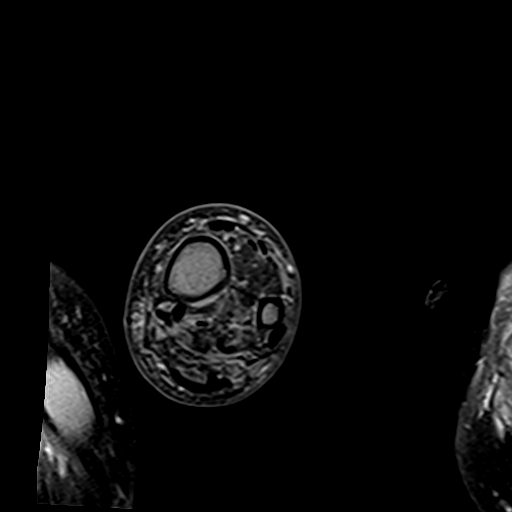
[im 6/30]
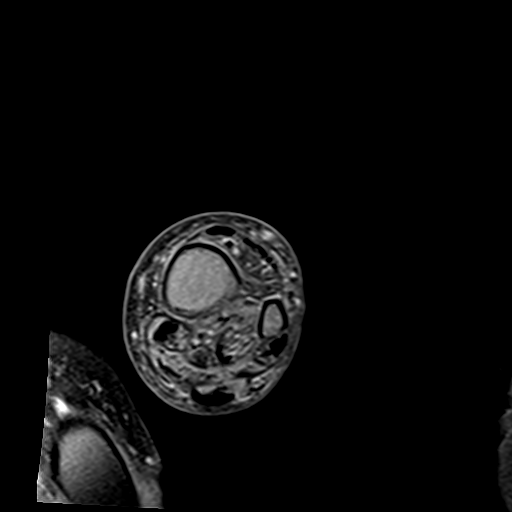
[im 18/30]
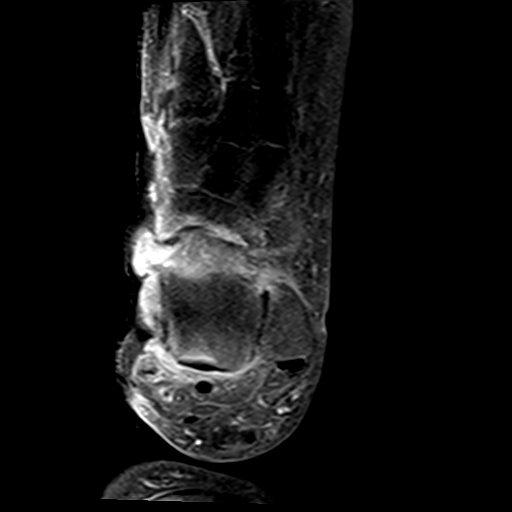
[im 30/30]
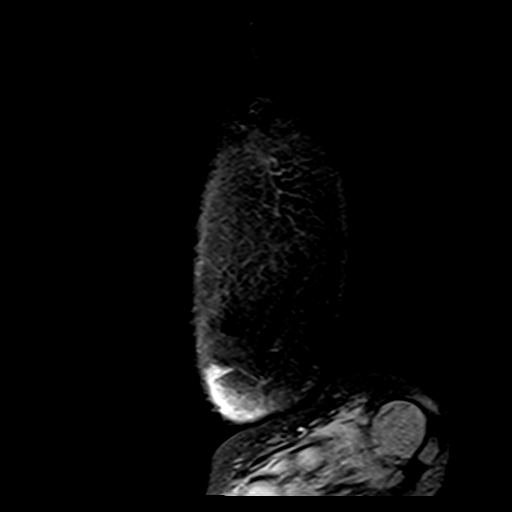

[19 of 40 positions shown; findings below may reference images not displayed]

FINDINGS: Bones:

There is a deep soft tissue ulceration along the medial aspect of
the ankle with ulcer base closely approximating the medial cortex of
the medial malleolus and talus. Associated bone marrow edema and
enhancement within the peripheral aspect of the medial malleolus and
medial talus with loss of cortical definition and low T1 bone marrow
signal compatible with acute osteomyelitis (series 5 and 6, image
10). Susceptibility within the tibiotalar joint space suggesting air
tracking intra-articularly. Trace tibiotalar joint effusion with
enhancing synovium concerning for septic arthritis.

Trace talonavicular joint effusion with marrow edema and cortical
irregularity along both sides of the joint (series 8 and 9, image
12). Findings are suspicious for septic arthritis and osteomyelitis.

Similar findings are present at the calcaneocuboid joint with small
joint effusion and associated subchondral marrow edema (series 9,
image 17).

Marrow edema within the residual great toe distal phalanx (series 9,
image 10) raises suspicion for osteomyelitis. There is evidence of
previous resection of the distal tuft.

Additional ulceration over the posterior calcaneus with marrow edema
and low T1 signal along the peripheral margin of the posterior
calcaneus suspicious for osteomyelitis (series 1 and 2, image 11).

Tendons:

The posterior tibialis and flexor digitorum longus tendons extend
into the area of ulceration at the lateral ankle and are not well
delineated, and may be torn. Flexor hallucis longus tendon intact.
Tendinosis of the distal Achilles tendon with poor delineation of
the distal insertion at site of likely pressure ulceration. Peroneal
tendons appear intact.

Ligaments:

LisFranc ligament intact. Deltoid and spring ligament complex are
poorly evaluated at site of ulceration, likely torn. Lateral ankle
tendons grossly intact.

Soft tissues:

Deep soft tissue ulceration at the lateral ankle, as described
above. Extensive surrounding soft tissue edema and enhancement
compatible with cellulitis. No drainable fluid collection. Extensive
pressure ulceration at the posterior hindfoot. There are extensive
foci of susceptibility tracking within the soft tissues of the
lateral forefoot (series 8, images 22-23). Additional areas of more
superficial ulceration along the plantar aspect of the midfoot and
forefoot with surrounding cellulitis.
IMPRESSION: 1. Deep soft tissue ulceration along the medial aspect of the ankle
with ulcer base extending to the medial cortex of the medial
malleolus and talus. Associated signal changes within the peripheral
aspects of the medial malleolus and medial talus compatible with
acute osteomyelitis.
2. Trace tibiotalar joint effusion with enhancing synovium
concerning for septic arthritis. There appears to be air or gas
within the tibiotalar joint, possibly extending from adjacent
ulceration.
3. Trace talonavicular joint effusion with marrow edema and cortical
irregularity along both sides of the joint suspicious for septic
arthritis and osteomyelitis.
4. Similar findings at the calcaneocuboid joint also suspicious for
septic arthritis and osteomyelitis.
5. Additional ulceration over the at the posterior hindfoot with
acute osteomyelitis of the posterior calcaneus.
6. Marrow edema within the residual great toe distal phalanx which
may reflect osteitis versus early osteomyelitis.
7. Extensive cellulitis with numerous foci of susceptibility
tracking within the soft tissues of the lateral forefoot concerning
for gas-forming infection. No drainable fluid collection.
8. Tendinous and ligamentous abnormalities, as above.

These results will be called to the ordering clinician or
representative by the Radiologist Assistant, and communication
documented in the PACS or [REDACTED].

## 2020-12-18 IMAGING — DX DG CHEST 1V PORT
1 series · 1 of 1 positions shown · non-contrast
Comparison: 05/31/2020, 05/13/2020

CLINICAL DATA: Code sepsis altered low blood pressure

EXAM:
PORTABLE CHEST 1 VIEW

[chest ap]
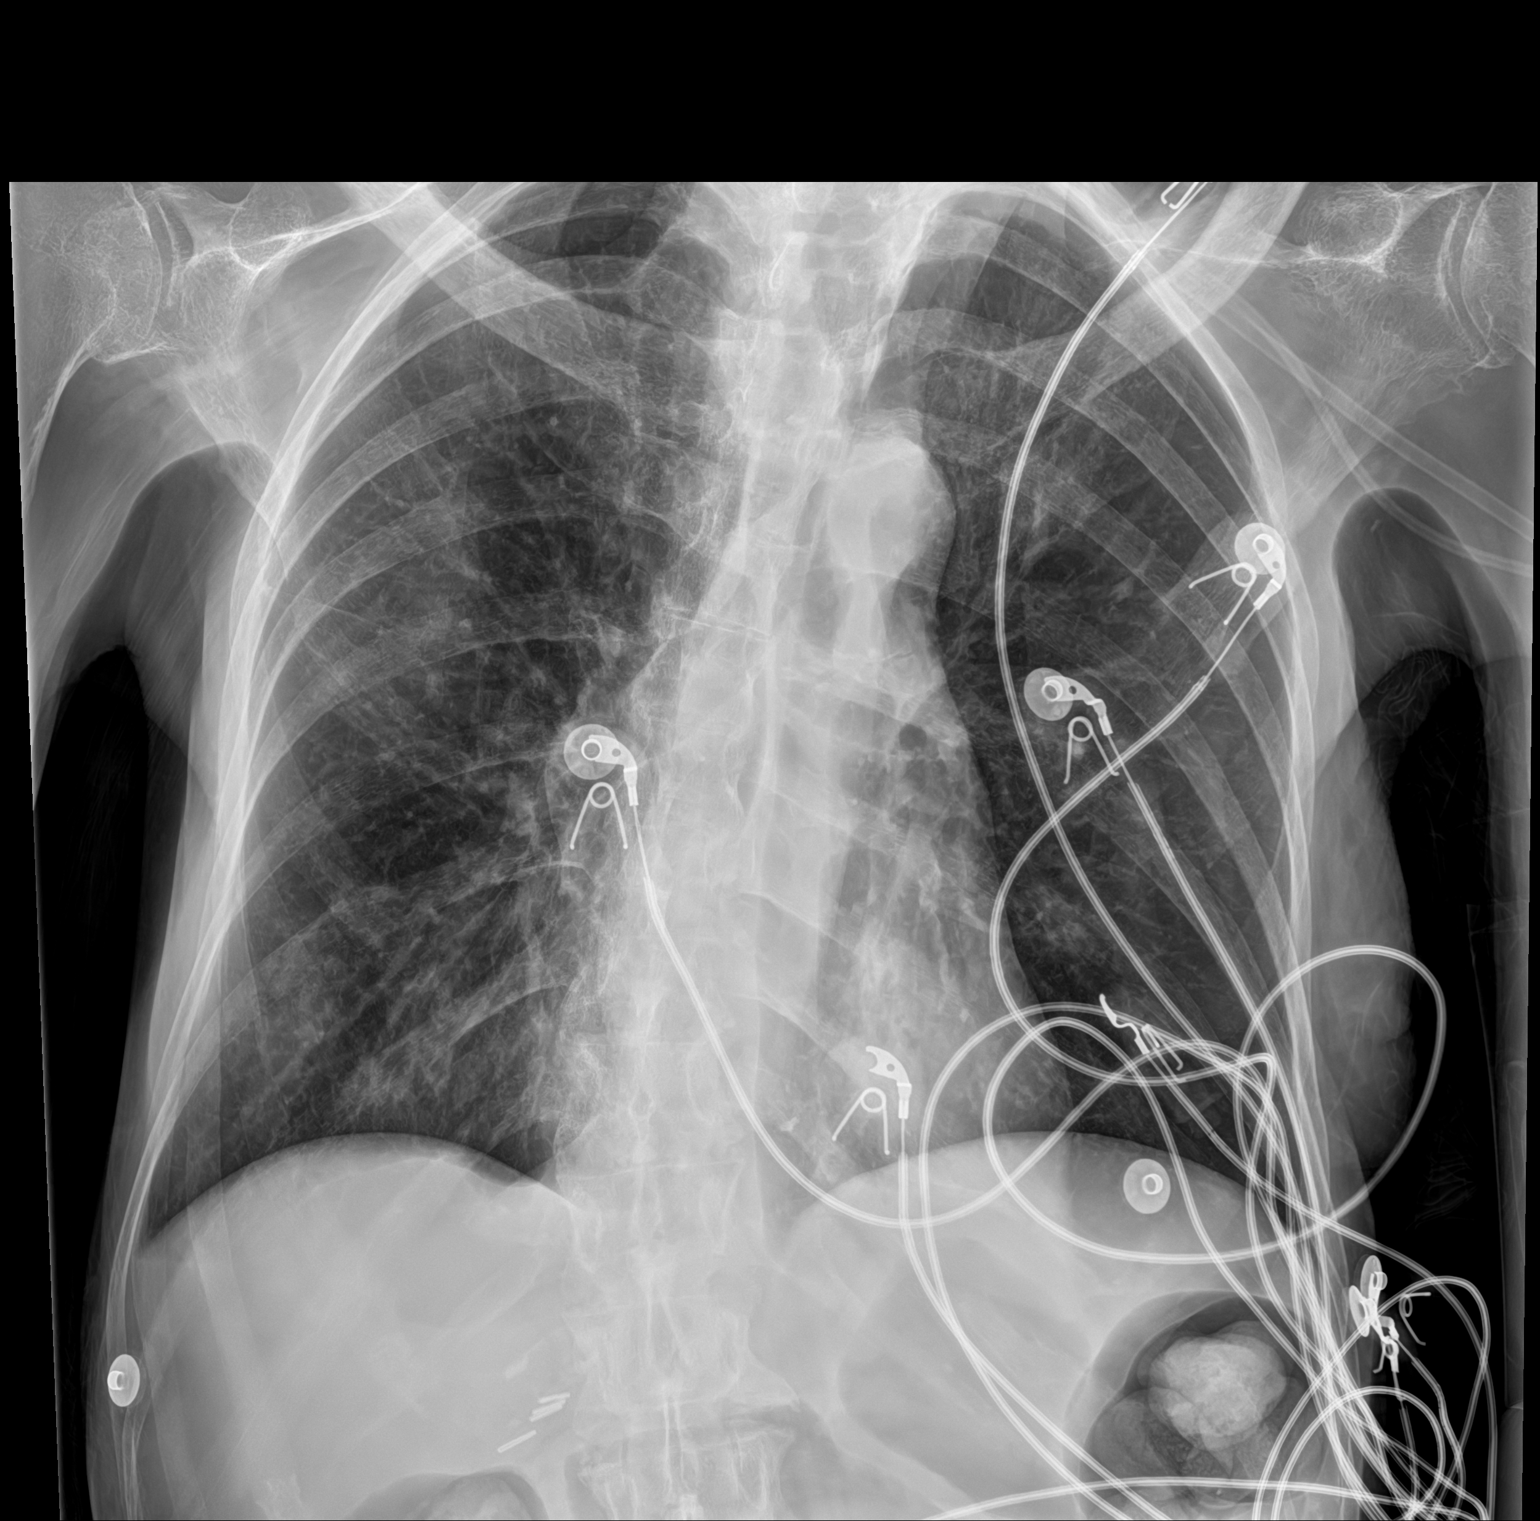

[1 of 1 positions shown; findings below may reference images not displayed]

FINDINGS: Left lung is grossly clear. Patchy vague airspace opacities in the
right mid lung and lung base. No pleural effusion. Normal heart
size. No pneumothorax.
IMPRESSION: Patchy vague airspace opacities in the right mid lung and lung base,
suspicious for pneumonia.
